# Patient Record
Sex: Female | Born: 1945 | Race: White | Hispanic: No | Marital: Married | Smoking: Never smoker
Health system: Southern US, Community
[De-identification: ages and names within clinical notes are randomized; demographics above are authoritative.]

## PROBLEM LIST (undated history)

## (undated) DIAGNOSIS — K219 Gastro-esophageal reflux disease without esophagitis: Secondary | ICD-10-CM

## (undated) DIAGNOSIS — D696 Thrombocytopenia, unspecified: Secondary | ICD-10-CM

## (undated) DIAGNOSIS — I2510 Atherosclerotic heart disease of native coronary artery without angina pectoris: Secondary | ICD-10-CM

## (undated) DIAGNOSIS — M8580 Other specified disorders of bone density and structure, unspecified site: Secondary | ICD-10-CM

## (undated) DIAGNOSIS — J189 Pneumonia, unspecified organism: Secondary | ICD-10-CM

## (undated) DIAGNOSIS — I509 Heart failure, unspecified: Secondary | ICD-10-CM

## (undated) DIAGNOSIS — I10 Essential (primary) hypertension: Secondary | ICD-10-CM

## (undated) DIAGNOSIS — M4800 Spinal stenosis, site unspecified: Secondary | ICD-10-CM

## (undated) DIAGNOSIS — E785 Hyperlipidemia, unspecified: Secondary | ICD-10-CM

## (undated) DIAGNOSIS — G4489 Other headache syndrome: Principal | ICD-10-CM

## (undated) DIAGNOSIS — R7303 Prediabetes: Secondary | ICD-10-CM

## (undated) DIAGNOSIS — M5136 Other intervertebral disc degeneration, lumbar region: Secondary | ICD-10-CM

## (undated) HISTORY — DX: Other specified disorders of bone density and structure, unspecified site: M85.80

## (undated) HISTORY — PX: CHOLECYSTECTOMY: SHX55

## (undated) HISTORY — DX: Atherosclerotic heart disease of native coronary artery without angina pectoris: I25.10

## (undated) HISTORY — PX: TUBAL LIGATION: SHX77

## (undated) HISTORY — DX: Thrombocytopenia, unspecified: D69.6

## (undated) HISTORY — PX: APPENDECTOMY: SHX54

## (undated) HISTORY — DX: Essential (primary) hypertension: I10

## (undated) HISTORY — DX: Other headache syndrome: G44.89

## (undated) HISTORY — DX: Heart failure, unspecified: I50.9

## (undated) HISTORY — DX: Hyperlipidemia, unspecified: E78.5

## (undated) HISTORY — DX: Other intervertebral disc degeneration, lumbar region: M51.36

---

## 1998-12-06 ENCOUNTER — Other Ambulatory Visit: Admission: RE | Admit: 1998-12-06 | Discharge: 1998-12-06 | Payer: Self-pay | Admitting: *Deleted

## 1999-11-25 ENCOUNTER — Other Ambulatory Visit: Admission: RE | Admit: 1999-11-25 | Discharge: 1999-11-25 | Payer: Self-pay | Admitting: *Deleted

## 2000-12-07 ENCOUNTER — Other Ambulatory Visit: Admission: RE | Admit: 2000-12-07 | Discharge: 2000-12-07 | Payer: Self-pay | Admitting: *Deleted

## 2002-12-11 HISTORY — PX: BACK SURGERY: SHX140

## 2002-12-11 HISTORY — PX: SHOULDER SURGERY: SHX246

## 2003-12-12 HISTORY — PX: COLONOSCOPY: SHX174

## 2004-10-01 ENCOUNTER — Ambulatory Visit: Payer: Self-pay | Admitting: Internal Medicine

## 2004-10-01 LAB — HM COLONOSCOPY: HM Colonoscopy: NORMAL

## 2004-12-08 ENCOUNTER — Ambulatory Visit: Payer: Self-pay | Admitting: Family Medicine

## 2004-12-09 ENCOUNTER — Ambulatory Visit: Payer: Self-pay | Admitting: Family Medicine

## 2005-02-21 ENCOUNTER — Ambulatory Visit: Payer: Self-pay | Admitting: Family Medicine

## 2005-05-11 ENCOUNTER — Ambulatory Visit: Payer: Self-pay | Admitting: Family Medicine

## 2005-05-11 ENCOUNTER — Other Ambulatory Visit: Admission: RE | Admit: 2005-05-11 | Discharge: 2005-05-11 | Payer: Self-pay | Admitting: Family Medicine

## 2005-05-11 LAB — CONVERTED CEMR LAB: Pap Smear: NORMAL

## 2005-08-15 ENCOUNTER — Ambulatory Visit: Payer: Self-pay | Admitting: Family Medicine

## 2005-09-01 ENCOUNTER — Ambulatory Visit: Payer: Self-pay | Admitting: Family Medicine

## 2006-01-29 ENCOUNTER — Encounter: Admission: RE | Admit: 2006-01-29 | Discharge: 2006-01-29 | Payer: Self-pay | Admitting: Orthopedic Surgery

## 2006-02-20 ENCOUNTER — Encounter: Admission: RE | Admit: 2006-02-20 | Discharge: 2006-02-20 | Payer: Self-pay | Admitting: Orthopedic Surgery

## 2006-11-10 DIAGNOSIS — M5136 Other intervertebral disc degeneration, lumbar region: Secondary | ICD-10-CM

## 2006-11-10 DIAGNOSIS — M51369 Other intervertebral disc degeneration, lumbar region without mention of lumbar back pain or lower extremity pain: Secondary | ICD-10-CM

## 2006-11-10 HISTORY — DX: Other intervertebral disc degeneration, lumbar region: M51.36

## 2006-11-10 HISTORY — DX: Other intervertebral disc degeneration, lumbar region without mention of lumbar back pain or lower extremity pain: M51.369

## 2006-11-13 ENCOUNTER — Ambulatory Visit: Payer: Self-pay | Admitting: Family Medicine

## 2006-11-21 ENCOUNTER — Ambulatory Visit: Payer: Self-pay | Admitting: Family Medicine

## 2006-12-06 ENCOUNTER — Ambulatory Visit: Payer: Self-pay | Admitting: Family Medicine

## 2007-07-16 ENCOUNTER — Ambulatory Visit: Payer: Self-pay | Admitting: Family Medicine

## 2007-07-16 DIAGNOSIS — H8110 Benign paroxysmal vertigo, unspecified ear: Secondary | ICD-10-CM | POA: Insufficient documentation

## 2007-07-16 DIAGNOSIS — E7800 Pure hypercholesterolemia, unspecified: Secondary | ICD-10-CM | POA: Insufficient documentation

## 2007-07-16 DIAGNOSIS — Z8679 Personal history of other diseases of the circulatory system: Secondary | ICD-10-CM | POA: Insufficient documentation

## 2007-07-16 DIAGNOSIS — M5137 Other intervertebral disc degeneration, lumbosacral region: Secondary | ICD-10-CM | POA: Insufficient documentation

## 2007-07-16 DIAGNOSIS — I10 Essential (primary) hypertension: Secondary | ICD-10-CM | POA: Insufficient documentation

## 2007-07-16 DIAGNOSIS — I509 Heart failure, unspecified: Secondary | ICD-10-CM

## 2007-07-16 DIAGNOSIS — I2510 Atherosclerotic heart disease of native coronary artery without angina pectoris: Secondary | ICD-10-CM | POA: Insufficient documentation

## 2008-02-11 ENCOUNTER — Telehealth: Payer: Self-pay | Admitting: Family Medicine

## 2008-04-02 ENCOUNTER — Ambulatory Visit: Payer: Self-pay | Admitting: Family Medicine

## 2008-04-08 ENCOUNTER — Ambulatory Visit: Payer: Self-pay | Admitting: Family Medicine

## 2008-04-08 ENCOUNTER — Telehealth: Payer: Self-pay | Admitting: Family Medicine

## 2008-04-10 LAB — CONVERTED CEMR LAB
ALT: 19 units/L (ref 0–35)
AST: 22 units/L (ref 0–37)
BUN: 18 mg/dL (ref 6–23)
CO2: 30 meq/L (ref 19–32)
Calcium: 9.5 mg/dL (ref 8.4–10.5)
Chloride: 108 meq/L (ref 96–112)
Cholesterol: 171 mg/dL (ref 0–200)
Creatinine, Ser: 1 mg/dL (ref 0.4–1.2)
GFR calc Af Amer: 72 mL/min
GFR calc non Af Amer: 60 mL/min
Glucose, Bld: 95 mg/dL (ref 70–99)
HDL: 40.5 mg/dL (ref >39.0)
LDL Cholesterol: 109 mg/dL — ABNORMAL HIGH (ref 0–99)
Potassium: 4.3 meq/L (ref 3.5–5.1)
Sodium: 143 meq/L (ref 135–145)
Total CHOL/HDL Ratio: 4.2
Triglycerides: 106 mg/dL (ref 0–149)
VLDL: 21 mg/dL (ref 0–40)

## 2008-07-09 ENCOUNTER — Ambulatory Visit: Payer: Self-pay | Admitting: Family Medicine

## 2008-07-09 ENCOUNTER — Other Ambulatory Visit: Admission: RE | Admit: 2008-07-09 | Discharge: 2008-07-09 | Payer: Self-pay | Admitting: Family Medicine

## 2008-07-09 DIAGNOSIS — N951 Menopausal and female climacteric states: Secondary | ICD-10-CM | POA: Insufficient documentation

## 2008-07-09 LAB — CONVERTED CEMR LAB: Pap Smear: NORMAL

## 2008-07-14 LAB — CONVERTED CEMR LAB
Basophils Absolute: 0 10*3/uL (ref 0.0–0.1)
Basophils Relative: 0.4 % (ref 0.0–3.0)
Eosinophils Absolute: 0.1 10*3/uL (ref 0.0–0.7)
Eosinophils Relative: 2.8 % (ref 0.0–5.0)
HCT: 41.5 % (ref 36.0–46.0)
Hemoglobin: 14.2 g/dL (ref 12.0–15.0)
Lymphocytes Relative: 24.6 % (ref 12.0–46.0)
MCHC: 34.3 g/dL (ref 30.0–36.0)
MCV: 90.6 fL (ref 78.0–100.0)
Monocytes Absolute: 0.2 10*3/uL (ref 0.1–1.0)
Monocytes Relative: 5 % (ref 3.0–12.0)
Neutro Abs: 2.8 10*3/uL (ref 1.4–7.7)
Neutrophils Relative %: 67.2 % (ref 43.0–77.0)
Platelets: 132 10*3/uL — ABNORMAL LOW (ref 150–400)
RBC: 4.58 M/uL (ref 3.87–5.11)
RDW: 12.2 % (ref 11.5–14.6)
TSH: 1.67 microintl units/mL (ref 0.35–5.50)
WBC: 4.1 10*3/uL — ABNORMAL LOW (ref 4.5–10.5)

## 2008-08-04 ENCOUNTER — Encounter: Payer: Self-pay | Admitting: Family Medicine

## 2008-08-04 LAB — HM MAMMOGRAPHY: HM Mammogram: NORMAL

## 2008-08-11 ENCOUNTER — Ambulatory Visit: Payer: Self-pay | Admitting: Family Medicine

## 2008-08-11 DIAGNOSIS — D696 Thrombocytopenia, unspecified: Secondary | ICD-10-CM | POA: Insufficient documentation

## 2008-08-13 LAB — CONVERTED CEMR LAB
Basophils Absolute: 0 10*3/uL (ref 0.0–0.1)
Basophils Relative: 0.7 % (ref 0.0–3.0)
Eosinophils Absolute: 0.2 10*3/uL (ref 0.0–0.7)
Eosinophils Relative: 4.6 % (ref 0.0–5.0)
HCT: 40 % (ref 36.0–46.0)
Hemoglobin: 14.4 g/dL (ref 12.0–15.0)
Lymphocytes Relative: 31.4 % (ref 12.0–46.0)
MCHC: 36 g/dL (ref 30.0–36.0)
MCV: 88.2 fL (ref 78.0–100.0)
Monocytes Absolute: 0.2 10*3/uL (ref 0.1–1.0)
Monocytes Relative: 6.7 % (ref 3.0–12.0)
Neutro Abs: 2.1 10*3/uL (ref 1.4–7.7)
Neutrophils Relative %: 56.6 % (ref 43.0–77.0)
Platelets: 135 10*3/uL — ABNORMAL LOW (ref 150–400)
RBC: 4.53 M/uL (ref 3.87–5.11)
RDW: 12.3 % (ref 11.5–14.6)
WBC: 3.7 10*3/uL — ABNORMAL LOW (ref 4.5–10.5)

## 2008-11-10 ENCOUNTER — Ambulatory Visit: Payer: Self-pay | Admitting: Family Medicine

## 2008-11-10 DIAGNOSIS — M8580 Other specified disorders of bone density and structure, unspecified site: Secondary | ICD-10-CM | POA: Insufficient documentation

## 2008-11-12 LAB — CONVERTED CEMR LAB: Vit D, 1,25-Dihydroxy: 24 — ABNORMAL LOW (ref 30–89)

## 2008-11-16 LAB — CONVERTED CEMR LAB
Basophils Absolute: 0 10*3/uL (ref 0.0–0.1)
Basophils Relative: 0.7 % (ref 0.0–3.0)
Eosinophils Absolute: 0.2 10*3/uL (ref 0.0–0.7)
Eosinophils Relative: 2.9 % (ref 0.0–5.0)
HCT: 40.5 % (ref 36.0–46.0)
Hemoglobin: 14.5 g/dL (ref 12.0–15.0)
Lymphocytes Relative: 20.9 % (ref 12.0–46.0)
MCHC: 35.8 g/dL (ref 30.0–36.0)
MCV: 89.3 fL (ref 78.0–100.0)
Monocytes Absolute: 0.3 10*3/uL (ref 0.1–1.0)
Monocytes Relative: 5.1 % (ref 3.0–12.0)
Neutro Abs: 3.8 10*3/uL (ref 1.4–7.7)
Neutrophils Relative %: 70.4 % (ref 43.0–77.0)
Platelets: 132 10*3/uL — ABNORMAL LOW (ref 150–400)
RBC: 4.54 M/uL (ref 3.87–5.11)
RDW: 11.6 % (ref 11.5–14.6)
WBC: 5.4 10*3/uL (ref 4.5–10.5)

## 2008-12-11 DIAGNOSIS — C801 Malignant (primary) neoplasm, unspecified: Secondary | ICD-10-CM

## 2008-12-11 HISTORY — DX: Malignant (primary) neoplasm, unspecified: C80.1

## 2009-04-14 ENCOUNTER — Ambulatory Visit: Payer: Self-pay | Admitting: Family Medicine

## 2009-05-18 ENCOUNTER — Ambulatory Visit: Payer: Self-pay | Admitting: Family Medicine

## 2009-05-19 LAB — CONVERTED CEMR LAB
Basophils Absolute: 0 10*3/uL (ref 0.0–0.1)
Basophils Relative: 0.8 % (ref 0.0–3.0)
Eosinophils Absolute: 0.1 10*3/uL (ref 0.0–0.7)
Eosinophils Relative: 3.3 % (ref 0.0–5.0)
HCT: 40.6 % (ref 36.0–46.0)
Hemoglobin: 14.4 g/dL (ref 12.0–15.0)
Lymphocytes Relative: 29.5 % (ref 12.0–46.0)
Lymphs Abs: 1 10*3/uL (ref 0.7–4.0)
MCHC: 35.5 g/dL (ref 30.0–36.0)
MCV: 87.3 fL (ref 78.0–100.0)
Monocytes Absolute: 0.2 10*3/uL (ref 0.1–1.0)
Monocytes Relative: 6.9 % (ref 3.0–12.0)
Neutro Abs: 2.1 10*3/uL (ref 1.4–7.7)
Neutrophils Relative %: 59.5 % (ref 43.0–77.0)
Platelets: 124 10*3/uL — ABNORMAL LOW (ref 150.0–400.0)
RBC: 4.66 M/uL (ref 3.87–5.11)
RDW: 12.3 % (ref 11.5–14.6)
WBC: 3.4 10*3/uL — ABNORMAL LOW (ref 4.5–10.5)

## 2009-05-24 LAB — CONVERTED CEMR LAB: Vit D, 25-Hydroxy: 33 ng/mL (ref 30–89)

## 2009-06-11 ENCOUNTER — Ambulatory Visit: Payer: Self-pay | Admitting: Family Medicine

## 2009-06-16 ENCOUNTER — Ambulatory Visit: Payer: Self-pay | Admitting: Oncology

## 2009-07-08 ENCOUNTER — Encounter: Payer: Self-pay | Admitting: Family Medicine

## 2009-07-08 LAB — CMP (CANCER CENTER ONLY)
ALT(SGPT): 21 U/L (ref 10–47)
AST: 26 U/L (ref 11–38)
Albumin: 3.9 g/dL (ref 3.3–5.5)
Alkaline Phosphatase: 69 U/L (ref 26–84)
BUN, Bld: 16 mg/dL (ref 7–22)
CO2: 31 mEq/L (ref 18–33)
Calcium: 9.5 mg/dL (ref 8.0–10.3)
Chloride: 104 mEq/L (ref 98–108)
Creat: 0.9 mg/dl (ref 0.6–1.2)
Glucose, Bld: 108 mg/dL (ref 73–118)
Potassium: 4.4 mEq/L (ref 3.3–4.7)
Sodium: 142 mEq/L (ref 128–145)
Total Bilirubin: 0.8 mg/dl (ref 0.20–1.60)
Total Protein: 7.1 g/dL (ref 6.4–8.1)

## 2009-07-08 LAB — CBC WITH DIFFERENTIAL (CANCER CENTER ONLY)
BASO#: 0 10*3/uL (ref 0.0–0.2)
BASO%: 0.8 % (ref 0.0–2.0)
EOS%: 4.1 % (ref 0.0–7.0)
Eosinophils Absolute: 0.2 10*3/uL (ref 0.0–0.5)
HCT: 40.5 % (ref 34.8–46.6)
HGB: 14.4 g/dL (ref 11.6–15.9)
LYMPH#: 0.9 10*3/uL (ref 0.9–3.3)
LYMPH%: 25.6 % (ref 14.0–48.0)
MCH: 30.4 pg (ref 26.0–34.0)
MCHC: 35.5 g/dL (ref 32.0–36.0)
MCV: 85 fL (ref 81–101)
MONO#: 0.2 10*3/uL (ref 0.1–0.9)
MONO%: 6.8 % (ref 0.0–13.0)
NEUT#: 2.3 10*3/uL (ref 1.5–6.5)
NEUT%: 62.7 % (ref 39.6–80.0)
Platelets: 134 10*3/uL — ABNORMAL LOW (ref 145–400)
RBC: 4.74 10*6/uL (ref 3.70–5.32)
RDW: 13 % (ref 10.5–14.6)
WBC: 3.6 10*3/uL — ABNORMAL LOW (ref 3.9–10.0)

## 2009-07-08 LAB — MORPHOLOGY - CHCC SATELLITE
PLT EST ~~LOC~~: DECREASED
RBC Comments: NORMAL

## 2009-08-10 ENCOUNTER — Telehealth: Payer: Self-pay | Admitting: Family Medicine

## 2009-08-11 ENCOUNTER — Telehealth: Payer: Self-pay | Admitting: Family Medicine

## 2009-11-01 ENCOUNTER — Ambulatory Visit: Payer: Self-pay | Admitting: Oncology

## 2009-11-08 ENCOUNTER — Encounter: Payer: Self-pay | Admitting: Family Medicine

## 2009-11-08 LAB — CBC WITH DIFFERENTIAL (CANCER CENTER ONLY)
BASO#: 0 10*3/uL (ref 0.0–0.2)
BASO%: 0.8 % (ref 0.0–2.0)
EOS%: 2.9 % (ref 0.0–7.0)
Eosinophils Absolute: 0.1 10*3/uL (ref 0.0–0.5)
HCT: 41.3 % (ref 34.8–46.6)
HGB: 13.9 g/dL (ref 11.6–15.9)
LYMPH#: 1.2 10*3/uL (ref 0.9–3.3)
LYMPH%: 25.9 % (ref 14.0–48.0)
MCH: 30.1 pg (ref 26.0–34.0)
MCHC: 33.6 g/dL (ref 32.0–36.0)
MCV: 90 fL (ref 81–101)
MONO#: 0.3 10*3/uL (ref 0.1–0.9)
MONO%: 5.7 % (ref 0.0–13.0)
NEUT#: 2.9 10*3/uL (ref 1.5–6.5)
NEUT%: 64.7 % (ref 39.6–80.0)
Platelets: 144 10*3/uL — ABNORMAL LOW (ref 145–400)
RBC: 4.61 10*6/uL (ref 3.70–5.32)
RDW: 12.5 % (ref 10.5–14.6)
WBC: 4.4 10*3/uL (ref 3.9–10.0)

## 2010-03-06 ENCOUNTER — Encounter: Admission: RE | Admit: 2010-03-06 | Discharge: 2010-03-06 | Payer: Self-pay | Admitting: Orthopaedic Surgery

## 2010-03-14 ENCOUNTER — Ambulatory Visit: Payer: Self-pay | Admitting: Family Medicine

## 2010-03-16 ENCOUNTER — Encounter: Payer: Self-pay | Admitting: Family Medicine

## 2010-03-16 LAB — CONVERTED CEMR LAB
ALT: 22 units/L (ref 0–35)
AST: 24 units/L (ref 0–37)
Albumin: 4.3 g/dL (ref 3.5–5.2)
Alkaline Phosphatase: 76 units/L (ref 39–117)
BUN: 16 mg/dL (ref 6–23)
Basophils Absolute: 0 10*3/uL (ref 0.0–0.1)
Basophils Relative: 0.3 % (ref 0.0–3.0)
Bilirubin, Direct: 0.1 mg/dL (ref 0.0–0.3)
CO2: 30 meq/L (ref 19–32)
Calcium: 9.4 mg/dL (ref 8.4–10.5)
Chloride: 104 meq/L (ref 96–112)
Cholesterol: 176 mg/dL (ref 0–200)
Creatinine, Ser: 1 mg/dL (ref 0.4–1.2)
Eosinophils Absolute: 0.1 10*3/uL (ref 0.0–0.7)
Eosinophils Relative: 1.1 % (ref 0.0–5.0)
GFR calc non Af Amer: 59.3 mL/min (ref >60)
Glucose, Bld: 111 mg/dL — ABNORMAL HIGH (ref 70–99)
HCT: 43.3 % (ref 36.0–46.0)
HDL: 55.4 mg/dL (ref >39.00)
Hemoglobin: 14.8 g/dL (ref 12.0–15.0)
LDL Cholesterol: 97 mg/dL (ref 0–99)
Lymphocytes Relative: 15.5 % (ref 12.0–46.0)
Lymphs Abs: 0.9 10*3/uL (ref 0.7–4.0)
MCHC: 34.3 g/dL (ref 30.0–36.0)
MCV: 91.1 fL (ref 78.0–100.0)
Monocytes Absolute: 0.3 10*3/uL (ref 0.1–1.0)
Monocytes Relative: 4.4 % (ref 3.0–12.0)
Neutro Abs: 4.7 10*3/uL (ref 1.4–7.7)
Neutrophils Relative %: 78.7 % — ABNORMAL HIGH (ref 43.0–77.0)
Platelets: 130 10*3/uL — ABNORMAL LOW (ref 150.0–400.0)
Potassium: 4.4 meq/L (ref 3.5–5.1)
RBC: 4.75 M/uL (ref 3.87–5.11)
RDW: 13.2 % (ref 11.5–14.6)
Sodium: 143 meq/L (ref 135–145)
TSH: 1.59 microintl units/mL (ref 0.35–5.50)
Total Bilirubin: 0.4 mg/dL (ref 0.3–1.2)
Total CHOL/HDL Ratio: 3
Total Protein: 7 g/dL (ref 6.0–8.3)
Triglycerides: 116 mg/dL (ref 0.0–149.0)
VLDL: 23.2 mg/dL (ref 0.0–40.0)
Vit D, 25-Hydroxy: 56 ng/mL (ref 30–89)
WBC: 6 10*3/uL (ref 4.5–10.5)

## 2010-03-30 ENCOUNTER — Encounter: Payer: Self-pay | Admitting: Family Medicine

## 2010-03-31 DIAGNOSIS — R928 Other abnormal and inconclusive findings on diagnostic imaging of breast: Secondary | ICD-10-CM | POA: Insufficient documentation

## 2010-04-06 ENCOUNTER — Encounter: Payer: Self-pay | Admitting: Family Medicine

## 2010-04-12 ENCOUNTER — Encounter: Admission: RE | Admit: 2010-04-12 | Discharge: 2010-04-12 | Payer: Self-pay | Admitting: Family Medicine

## 2010-11-07 ENCOUNTER — Ambulatory Visit: Payer: Self-pay | Admitting: Oncology

## 2010-11-09 ENCOUNTER — Encounter: Payer: Self-pay | Admitting: Family Medicine

## 2010-11-09 LAB — CBC WITH DIFFERENTIAL/PLATELET
BASO%: 0.5 % (ref 0.0–2.0)
Basophils Absolute: 0 10*3/uL (ref 0.0–0.1)
EOS%: 2.8 % (ref 0.0–7.0)
Eosinophils Absolute: 0.1 10*3/uL (ref 0.0–0.5)
HCT: 42.8 % (ref 34.8–46.6)
HGB: 15 g/dL (ref 11.6–15.9)
LYMPH%: 29.1 % (ref 14.0–49.7)
MCH: 31 pg (ref 25.1–34.0)
MCHC: 35.1 g/dL (ref 31.5–36.0)
MCV: 88.3 fL (ref 79.5–101.0)
MONO#: 0.3 10*3/uL (ref 0.1–0.9)
MONO%: 6.9 % (ref 0.0–14.0)
NEUT#: 2.7 10*3/uL (ref 1.5–6.5)
NEUT%: 60.7 % (ref 38.4–76.8)
Platelets: 134 10*3/uL — ABNORMAL LOW (ref 145–400)
RBC: 4.84 10*6/uL (ref 3.70–5.45)
RDW: 12.5 % (ref 11.2–14.5)
WBC: 4.4 10*3/uL (ref 3.9–10.3)
lymph#: 1.3 10*3/uL (ref 0.9–3.3)

## 2011-01-10 NOTE — Assessment & Plan Note (Signed)
Summary: WALK IN   Vital Signs:  Patient Profile:   65 Years Old Female Temp:     97.8 degrees F oral Pulse rate:   72 / minute Pulse rhythm:   regular BP sitting:   110 / 68  (right arm) Cuff size:   large  Vitals Entered By: Lowella Petties (July 16, 2007 9:01 AM)               Chief Complaint:  Dizzy since early a.m..  History of Present Illness: got up this am at 4:40 and was dizzy  room started spinning at 5 and is still doing it has not eaten little slt ha but no fever no numb, weakness, or trouble with speech        Review of Systems      See HPI  General      Denies chills, fatigue, and fever.  Eyes      Denies blurring.  CV      Denies chest pain or discomfort.  Resp      Denies shortness of breath.  GI      Complains of nausea.      Denies vomiting.  Neuro      Complains of poor balance and sensation of room spinning.      Denies inability to speak, memory loss, numbness, visual disturbances, and weakness.  Psych      mood is ok   Physical Exam  General:     overwt, acutely dizzy in wheelchair Head:     Normocephalic and atraumatic without obvious abnormalities. No apparent alopecia or balding. Eyes:     vision grossly intact, pupils equal, pupils round, and pupils reactive to light.  3-4 beats of horiz nystagmus bilat Ears:     R ear normal and L ear normal.   Nose:     no nasal discharge.   Mouth:     pharynx pink and moist.   Neck:     No deformities, masses, or tenderness noted.no thyromegaly, no JVD, and no carotid bruits.   Lungs:     Normal respiratory effort, chest expands symmetrically. Lungs are clear to auscultation, no crackles or wheezes. Heart:     Normal rate and regular rhythm. S1 and S2 normal without gallop, murmur, click, rub or other extra sounds. Msk:     No deformity or scoliosis noted of thoracic or lumbar spine.   Neurologic:     cranial nerves II-XII intact, strength normal in all extremities,  sensation intact to light touch, DTRs symmetrical and normal, and toes down bilaterally on Babinski.   pt swayed all directions on Rhomberg test but could stand unassisted Skin:     turgor normal, color normal, and no rashes.  no pallor Cervical Nodes:     No lymphadenopathy noted Psych:     pt is quiet today, nl affect, nl speech    Impression & Recommendations:  Problem # 1:  BENIGN POSITIONAL VERTIGO (ICD-386.11) with benign neuro exam stressed to call or seek care if worse or any neurological signs or symptoms work note to return 8/7 if feeling better Her updated medication list for this problem includes:    Meclizine Hcl 25 Mg Tabs (Meclizine hcl) .Marland Kitchen... 1 by mouth three times a day as needed dizziness   Complete Medication List: 1)  Adult Aspirin Low Strength 81 Mg Tbdp (Aspirin) .... One by mouth qd 2)  Caduet 5-20 Mg Tabs (Amlodipine-atorvastatin) .... One by mouth qd 3)  Micardis 80 Mg Tabs (Telmisartan) .... One half by mouth qd 4)  Aleve 220 Mg Tabs (Naproxen sodium) .... Two by mouth qd 5)  Meclizine Hcl 25 Mg Tabs (Meclizine hcl) .Marland Kitchen.. 1 by mouth three times a day as needed dizziness   Patient Instructions: 1)  try to keep from moving head quickly or suddenly 2)  meclizine will sedate you, use caution- and stop it if you get blurry vision 3)  if any numbness, weakness or other change go to the ER 4)  keep fluid intake up 5)  call if not improved in several days    Prescriptions: MECLIZINE HCL 25 MG  TABS (MECLIZINE HCL) 1 by mouth three times a day as needed dizziness  #30 x 1   Entered and Authorized by:   Judith Part MD   Signed by:   Judith Part MD on 07/16/2007   Method used:   Print then Give to Patient   RxID:   [id number]       Prior Medications: ADULT ASPIRIN LOW STRENGTH 81 MG  TBDP (ASPIRIN) one by mouth qd CADUET 5-20 MG  TABS (AMLODIPINE-ATORVASTATIN) one by mouth qd MICARDIS 80 MG  TABS (TELMISARTAN) one half by mouth qd ALEVE  220 MG  TABS (NAPROXEN SODIUM) two by mouth qd

## 2011-01-10 NOTE — Assessment & Plan Note (Signed)
Summary: acute/out of meds/cmt   Vital Signs:  Patient Profile:   65 Years Old Female Weight:      204 pounds Temp:     98.0 degrees F oral Pulse rate:   75 / minute BP sitting:   137 / 105  (right arm) Cuff size:   large  Vitals Entered By: Cooper Render (April 02, 2008 11:37 AM)                 Chief Complaint:  med refill and caduet.  History of Present Illness: Her due to being out of Caduet--has not been seen in>49yr for HBP.  No labs in 1 yr.  Has refills on MicardisHCT    Current Allergies (reviewed today): No known allergies      Review of Systems      See HPI   Physical Exam  General:     alert, well-developed, well-nourished, and well-hydrated.  NAD Neck:     normal carotid upstroke and no carotid bruits.   Lungs:     normal respiratory effort, no intercostal retractions, no accessory muscle use, and normal breath sounds.   Heart:     repeat BP---148/88normal rate, regular rhythm, and no murmur.   Extremities:     no pretibial edema bilat Psych:     normally interactive and good eye contact.      Impression & Recommendations:  Problem # 1:  HYPERTENSION (ICD-401.9) Assessment: Unchanged stable on current meds gave her a week of Caduet--needs fassting labs done during that time will refill Caduet if labs WNL, or leave to Dr Janit Pagan see Dr Milinda Antis in follow up as can be scheduled. The following medications were removed from the medication list:    Tekturna Hct 150-25 Mg Tabs (Aliskiren-hydrochlorothiazide)  Her updated medication list for this problem includes:    Caduet 5-20 Mg Tabs (Amlodipine-atorvastatin) ..... One by mouth qd    Micardis Hct 80-12.5 Mg Tabs (Telmisartan-hctz) .Marland Kitchen... 1/2 by mouth qd  BP today: 137/105 Prior BP: 110/68 (07/16/2007)   Complete Medication List: 1)  Adult Aspirin Low Strength 81 Mg Tbdp (Aspirin) .... One by mouth qd 2)  Caduet 5-20 Mg Tabs (Amlodipine-atorvastatin) .... One by mouth qd 3)  Aleve 220 Mg  Tabs (Naproxen sodium) .... Two by mouth qd 4)  Micardis Hct 80-12.5 Mg Tabs (Telmisartan-hctz) .... 1/2 by mouth qd   Patient Instructions: 1)  schedule in for fasting lipids, SGOT/SGPT--272.0 2)                                       B'met--401.1 3)  needs to be done within 1 wk for Dr Milinda Antis 4)  schedule in to seeDr Milinda Antis for CPX in mrxt few months    ] Prior Medications (reviewed today): ADULT ASPIRIN LOW STRENGTH 81 MG  TBDP (ASPIRIN) one by mouth qd CADUET 5-20 MG  TABS (AMLODIPINE-ATORVASTATIN) one by mouth qd ALEVE 220 MG  TABS (NAPROXEN SODIUM) two by mouth qd MICARDIS HCT 80-12.5 MG  TABS (TELMISARTAN-HCTZ) 1/2 by mouth qd Current Allergies (reviewed today): No known allergies

## 2011-01-10 NOTE — Assessment & Plan Note (Signed)
Summary: CHECK MEDICINE & DISCUSS LABS/BIR   Vital Signs:  Patient profile:   65 year old female Height:      64 inches Weight:      207 pounds BMI:     35.66 Temp:     97.9 degrees F oral Pulse rate:   76 / minute Pulse rhythm:   regular BP sitting:   120 / 70  (left arm) Cuff size:   large  Vitals Entered By: Providence Crosby (June 11, 2009 3:56 PM) CC: discuss medications and labs    History of Present Illness: here for f/u  at last visit in may was started on omeprazole for gastritis- also adv to stop fosamax and aleve was better in a couple of days  no problems or side effects with omeprazole  is able to eat nl   re check cbc with diff- platelet count stable  wbc slt  low not bleeding or bruising at all   tylenol helps arthritis pain - no problems (will not return to aleve)    vit D 33  did start 2000 international units   Allergies: 1)  ! * Aleve  Past History:  Past Medical History: Last updated: 11/10/2008 Congestive heart failure Coronary artery disease Hypertension mild thrombocytopenia  osteopenia   Past Surgical History: Last updated: 07/16/2007 Cholecystectomy Tubal ligation Shoulder surgery- fall  Colonoscopy (09/2004) Carotid doppler- 39 % stenosis (03/2004) Echo- mild LVH, EF 60% Back and shoulder surgery, fall at work (2004) L5- S1 bulging disk (11/2006)  Family History: Last updated: 07/09/2008 Father: CAD Mother: pacemaker Siblings: sister- lung cancer MGM breast ca sister with DM  sister OP   Social History: Last updated: 07/09/2008 Marital Status: Married Children: 4 daughters Occupation: -- Chief Financial Officer  non smoker   Risk Factors: Smoking Status: never (07/16/2007)  Review of Systems General:  Denies fever, loss of appetite, and malaise. Eyes:  Denies blurring and itching. ENT:  Denies earache, sinus pressure, and sore throat. CV:  Denies fainting, fatigue, lightheadness, near fainting, and shortness of breath with  exertion. Resp:  Denies cough and wheezing. GI:  Denies abdominal pain, bloody stools, change in bowel habits, gas, hemorrhoids, indigestion, nausea, and vomiting. GU:  Denies discharge and dysuria. Derm:  Denies lesion(s), poor wound healing, and rash. Neuro:  Denies numbness and tingling. Psych:  mood is ok. Endo:  Denies excessive thirst and excessive urination. Heme:  Denies abnormal bruising and bleeding.  Physical Exam  General:  overweight but generally well appearing  Head:  normocephalic, atraumatic, and no abnormalities observed.   Eyes:  vision grossly intact, pupils equal, pupils round, and pupils reactive to light.   Ears:  R ear normal and L ear normal.   Nose:  no nasal discharge.   Mouth:  pharynx pink and moist.   Neck:  supple with full rom and no masses or thyromegally, no JVD or carotid bruit  Chest Wall:  No deformities, masses, or tenderness noted. Lungs:  Normal respiratory effort, chest expands symmetrically. Lungs are clear to auscultation, no crackles or wheezes. Heart:  Normal rate and regular rhythm. S1 and S2 normal without gallop, murmur, click, rub or other extra sounds. Abdomen:  Bowel sounds positive,abdomen soft and non-tender without masses, organomegaly or hernias noted. Msk:  No deformity or scoliosis noted of thoracic or lumbar spine.   Extremities:  No clubbing, cyanosis, edema, or deformity noted with normal full range of motion of all joints.   Neurologic:  sensation intact to light touch,  gait normal, and DTRs symmetrical and normal.   Skin:  Intact without suspicious lesions or rashes Cervical Nodes:  No lymphadenopathy noted Inguinal Nodes:  No significant adenopathy Psych:  normal affect, talkative and pleasant    Impression & Recommendations:  Problem # 1:  GASTRITIS (ICD-535.50) Assessment Improved much improved off fosamax and aleve  also resolution of some heartburn  disc diet/wt loss continue ppi once daily for 1 mo then every  other day- and update Her updated medication list for this problem includes:    Omeprazole 20 Mg Tbec (Omeprazole) .Marland Kitchen... 1 by mouth once daily in am 30 minutes before breakfast  Problem # 2:  THROMBOCYTOPENIA (ICD-287.5)  platelet count continues to be mildly low without symptoms  slt low wbc as well  ref to heme   Orders: Hematology Referral (Hematology)  Problem # 3:  OSTEOPENIA (ICD-733.90) Assessment: Unchanged hold fosamax due to gi upset disc vit D suppl- 2000 international units daily add miacalcin ns  The following medications were removed from the medication list:    Fosamax 70 Mg Tabs (Alendronate sodium) .Marland Kitchen... 1 by mouth once weekly as directed Her updated medication list for this problem includes:    Caltrate 600+d 600-400 Mg-unit Tabs (Calcium carbonate-vitamin d) ..... One by mouth two times a day    Miacalcin 200 Unit/ml Soln (Calcitonin (salmon)) .Marland Kitchen... 1 spray in one nostril daily - alternate nostils  Complete Medication List: 1)  Adult Aspirin Low Strength 81 Mg Tbdp (Aspirin) .... One by mouth daily 2)  Caduet 5-20 Mg Tabs (Amlodipine-atorvastatin) .... One by mouth once daily 3)  Micardis Hct 80-12.5 Mg Tabs (Telmisartan-hctz) .... 1/2 by mouth once daily 4)  Caltrate 600+d 600-400 Mg-unit Tabs (Calcium carbonate-vitamin d) .... One by mouth two times a day 5)  Omeprazole 20 Mg Tbec (Omeprazole) .Marland Kitchen.. 1 by mouth once daily in am 30 minutes before breakfast 6)  Miacalcin 200 Unit/ml Soln (Calcitonin (salmon)) .Marland Kitchen.. 1 spray in one nostril daily - alternate nostils  Patient Instructions: 1)  take the omeprazole 20 mg once daily for one month 2)  then cut to one every other day for 2 weeks 3)  then call and update me - will decide to either stop it or continue  4)  stay away from spicy foods  5)  stay away from aleve/ advil or other medicines of that nature  Prescriptions: MIACALCIN 200 UNIT/ML SOLN (CALCITONIN (SALMON)) 1 spray in one nostril daily - alternate  nostils  #1 month x 11   Entered and Authorized by:   Judith Part MD   Signed by:   Judith Part MD on 06/11/2009   Method used:   Print then Give to Patient   RxID:   [id number]

## 2011-01-10 NOTE — Progress Notes (Signed)
Summary: ? Micardis  Phone Note From Pharmacy Call back at [phone]   Caller: [facility] Call For: Dr. Milinda Antis  Summary of Call: He was getting ready to fill her Micardis and directions show 1/2 daily and these are in a blister pack and they should not be cut and left out because they are moisture sensative and will absorb the moisture if left out for a day.  Please call back because this needs to be changes. Maybe she could be changed to something else or switched to one every other day. Call to discuss with him. Initial call taken by: Sydell Axon,  February 11, 2008 5:04 PM  Follow-up for Phone Call        will change from 1/2 of 80 to a whole 40 mg daily px written on EMR for call in  Follow-up by: Judith Part MD,  February 11, 2008 5:08 PM  Additional Follow-up for Phone Call Additional follow up Details #1::        this is supposed to be micardis hct and it doesn't come in a 40/12.5, please adv  I changed my mind- for now keep splitting micardis hctz 80- I do not think it will make that big a difference cutting it - keep doing the same way  Additional Follow-up by: Lowella Petties,  February 11, 2008 5:13 PM    Additional Follow-up for Phone Call Additional follow up Details #2::    advised pharmacist Follow-up by: Lowella Petties,  February 12, 2008 12:21 PM  New/Updated Medications: TEKTURNA HCT 150-25 MG  TABS (ALISKIREN-HYDROCHLOROTHIAZIDE)  MICARDIS HCT 80-12.5 MG  TABS (TELMISARTAN-HCTZ) 1/2 by mouth qd

## 2011-01-10 NOTE — Letter (Signed)
Summary: [facility]  [facility]   Imported By: Lanelle Bal 11/24/2009 12:21:46  _____________________________________________________________________  External Attachment:    Type:   Image     Comment:   External Document

## 2011-01-10 NOTE — Progress Notes (Signed)
Summary: regarding preferred meds  Phone Note Call from Patient Call back at Work Phone [phone]   Caller: Patient Call For: Judith Part MD Summary of Call: Pt says her insurance company prefers protonix or prevacid, please send to [facility] [employer]. (See phone note from yesterday). Initial call taken by: Lowella Petties CMA,  August 11, 2009 1:29 PM  Follow-up for Phone Call        will try protonix px written on EMR for call in  please update me if heartburn is not improved with this next week Follow-up by: Judith Part MD,  August 11, 2009 1:44 PM  Additional Follow-up for Phone Call Additional follow up Details #1::        Advised patient.  ......................................................Marland KitchenNatasha Chavers CMA (AAMA) August 11, 2009 2:51 PM     New/Updated Medications: PROTONIX 40 MG TBEC (PANTOPRAZOLE SODIUM) 1 by mouth once daily in am Prescriptions: PROTONIX 40 MG TBEC (PANTOPRAZOLE SODIUM) 1 by mouth once daily in am  #30 x 5   Entered by:   Liane Comber CMA (AAMA)   Authorized by:   Judith Part MD   Signed by:   Liane Comber CMA Duncan Dull) on 08/11/2009   Method used:   Electronically to        [facility]* (retail)       [address]       [place], [place]  [address]       Ph: [phone]       Fax: [phone]   RxID:   [id number]

## 2011-01-10 NOTE — Letter (Signed)
Summary: [facility]   Imported By: Lanelle Bal 03/22/2010 13:19:13  _____________________________________________________________________  External Attachment:    Type:   Image     Comment:   External Document

## 2011-01-10 NOTE — Progress Notes (Signed)
Summary: regarding omeprazole  Phone Note Call from Patient Call back at Work Phone [phone]   Caller: Patient Call For: Judith Part MD Summary of Call: Pt is calling to report how she has done with omeprazole.  She says the abd pain is gone but she has terrible indegestion, having to take 3 or 4 rolaids a day.  Please advise.  She will need refill sent to [facility] if you want her to continue with it. Initial call taken by: Lowella Petties CMA,  August 10, 2009 9:18 AM  Follow-up for Phone Call        thanks for the update -- I will want to try another PPI to see if we can improve that  please have her call her insurance- and see what proton pump inhibitor is covered /preferred  choices include nexium, protonix, aciphex, prevacid , zegerid let me know and I will px Follow-up by: Judith Part MD,  August 10, 2009 10:21 AM  Additional Follow-up for Phone Call Additional follow up Details #1::        Advised pt, she will let us know. Additional Follow-up by: Lowella Petties CMA,  August 10, 2009 3:16 PM

## 2011-01-10 NOTE — Assessment & Plan Note (Signed)
Summary: CPX/DLO   Vital Signs:  Patient profile:   65 year old female Height:      64.25 inches Weight:      205.50 pounds BMI:     35.13 Temp:     98 degrees F oral Pulse rate:   76 / minute Pulse rhythm:   regular BP sitting:   128 / 76  (left arm) Cuff size:   large  Vitals Entered By: Lewanda Rife LPN (March 14, 9146 10:27 AM) CC: complete physical with pap and breast exam LMP 16yrs ago   History of Present Illness: here for health mt and also to rev chronic med problems   other than her back - is doing ok   bp 128/76- very good control  hx of coronary artery disease nl carotid dopplers in o5 recent echo- no problems at all   osteopenia 8/09- due f/u this summer-- wants to disc this at next PE -- and wait a while  ca and vit D -- is faithful with this  no exercise at all -- due to back pain   lipids due Last Lipid ProfileCholesterol: 171 (04/08/2008 9:48:00 AM)HDL:  40.5 (04/08/2008 9:48:00 AM)LDL:  109 (04/08/2008 9:48:00 AM)Triglycerides:  Last Liver profileSGOT:  22 (04/08/2008 9:48:00 AM)SPGT:  19 (04/08/2008 9:48:00 AM)T. Bili:  Alk Phos:    has been in fairl control diet has been very good -and is fasting for labs   colonosc nl 05- 10 y f/u recommended  pap 09  nl -- is 2 years- will wait one more year  no hx of abn paps     mam 8/09 nl -- is overdue and wants to schedule  self exam- no lumps or changes   TD04   has something going on with her back - for 2 months  pain kept getting worse- saw Dr Rayburn Ma on 3/22-- ? pinched nerve MRI showed protruding disks -- and has f/u on wed   Allergies: 1)  ! * Aleve  Past History:  Past Surgical History: Last updated: 07/16/2007 Cholecystectomy Tubal ligation Shoulder surgery- fall  Colonoscopy (09/2004) Carotid doppler- 39 % stenosis (03/2004) Echo- mild LVH, EF 60% Back and shoulder surgery, fall at work (2004) L5- S1 bulging disk (11/2006)  Family History: Last updated: 07/09/2008 Father:  CAD Mother: pacemaker Siblings: sister- lung cancer MGM breast ca sister with DM  sister OP   Social History: Last updated: 07/09/2008 Marital Status: Married Children: 4 daughters Occupation: -- Chief Financial Officer  non smoker   Risk Factors: Smoking Status: never (07/16/2007)  Past Medical History: Congestive heart failure Coronary artery disease Hypertension mild thrombocytopenia  osteopenia  hyperlipidemia deg disc dz in low back    cardiol  Review of Systems General:  Complains of fatigue; denies fever, loss of appetite, and malaise. Eyes:  Denies blurring and eye irritation. ENT:  Denies sinus pressure and sore throat. CV:  Denies chest pain or discomfort, palpitations, shortness of breath with exertion, and swelling of feet. Resp:  Denies cough and wheezing. GI:  Denies abdominal pain, bloody stools, change in bowel habits, indigestion, and loss of appetite. GU:  Denies abnormal vaginal bleeding, discharge, dysuria, and urinary frequency. MS:  Complains of low back pain, mid back pain, and stiffness; denies cramps and muscle weakness; pain does radiate down her back. Derm:  Denies itching, lesion(s), poor wound healing, and rash. Neuro:  Denies numbness and tingling. Psych:  mood is fairly stable . Endo:  Denies cold intolerance, excessive thirst, excessive urination, and  heat intolerance. Heme:  Denies abnormal bruising and bleeding.  Physical Exam  General:  overweight but generally well appearing  Head:  normocephalic, atraumatic, and no abnormalities observed.   Eyes:  vision grossly intact, pupils equal, pupils round, and pupils reactive to light.  no conjunctival pallor, injection or icterus  Ears:  R ear normal and L ear normal.   Nose:  no nasal discharge.   Mouth:  pharynx pink and moist.   Neck:  supple with full rom and no masses or thyromegally, no JVD or carotid bruit  Chest Wall:  No deformities, masses, or tenderness noted. Breasts:  No mass,  nodules, thickening, tenderness, bulging, retraction, inflamation, nipple discharge or skin changes noted.   Lungs:  Normal respiratory effort, chest expands symmetrically. Lungs are clear to auscultation, no crackles or wheezes. Heart:  Normal rate and regular rhythm. S1 and S2 normal without gallop, murmur, click, rub or other extra sounds. Abdomen:  Bowel sounds positive,abdomen soft and non-tender without masses, organomegaly or hernias noted. no renal bruits  Msk:  No deformity or scoliosis noted of thoracic or lumbar spine.  poor rom LS- pt is uncomfortable with slow but steady gait  Pulses:  R and L carotid,radial,femoral,dorsalis pedis and posterior tibial pulses are full and equal bilaterally Extremities:  No clubbing, cyanosis, edema, or deformity noted with normal full range of motion of all joints.   Neurologic:  sensation intact to light touch and DTRs symmetrical and normal.   Skin:  Intact without suspicious lesions or rashes lentigos diffusely  Cervical Nodes:  No lymphadenopathy noted Axillary Nodes:  No palpable lymphadenopathy Inguinal Nodes:  No significant adenopathy Psych:  normal affect, talkative and pleasant    Impression & Recommendations:  Problem # 1:  HEALTH MAINTENANCE EXAM (ICD-V70.0) Assessment Comment Only reviewed health habits including diet, exercise and skin cancer prevention reviewed health maintenance list and family history  labs today disc imp of wt loss when able for better health Orders: Venipuncture (57846) TLB-Lipid Panel (80061-LIPID) TLB-BMP (Basic Metabolic Panel-BMET) (80048-METABOL) TLB-CBC Platelet - w/Differential (85025-CBCD) TLB-Hepatic/Liver Function Pnl (80076-HEPATIC) TLB-TSH (Thyroid Stimulating Hormone) (84443-TSH)  Problem # 2:  THROMBOCYTOPENIA (ICD-287.5) Assessment: Improved followed by heme and normalized now-- contnues to f/u yearly  no symptoms   Problem # 3:  OTHER SCREENING MAMMOGRAM (ICD-V76.12) Assessment:  Unchanged annual mammogram scheduled adv pt to continue regular self breast exams non remarkable breast exam today  Orders: Radiology Referral (Radiology)  Problem # 4:  HYPERTENSION (ICD-401.9) Assessment: Unchanged bp remains in good control with current meds  no changes  lab today urged to stay as active as she can Her updated medication list for this problem includes:    Caduet 5-20 Mg Tabs (Amlodipine-atorvastatin) ..... One by mouth once daily    Micardis Hct 80-12.5 Mg Tabs (Telmisartan-hctz) .Marland Kitchen... 1/2 by mouth once daily  Orders: Venipuncture (96295) TLB-Lipid Panel (80061-LIPID) TLB-BMP (Basic Metabolic Panel-BMET) (80048-METABOL) TLB-CBC Platelet - w/Differential (85025-CBCD) TLB-Hepatic/Liver Function Pnl (80076-HEPATIC) TLB-TSH (Thyroid Stimulating Hormone) (84443-TSH)  BP today: 128/76 Prior BP: 120/70 (06/11/2009)  Labs Reviewed: K+: 4.3 (04/08/2008) Creat: : 1.0 (04/08/2008)   Chol: 171 (04/08/2008)   HDL: 40.5 (04/08/2008)   LDL: 109 (04/08/2008)   TG: 106 (04/08/2008)  Problem # 5:  Hx of DEGENERATIVE DISC DISEASE, LUMBAR SPINE (ICD-722.52) Assessment: Comment Only for f/u wed- with ortho- hopes to get some relief hydrocodone as needed   Problem # 6:  Hx of HYPERCHOLESTEROLEMIA (ICD-272.0) Assessment: Unchanged  fair control with caduet rev low sat  fat diet- per pt is compliant with that  lab today and adv  Her updated medication list for this problem includes:    Caduet 5-20 Mg Tabs (Amlodipine-atorvastatin) ..... One by mouth once daily  Orders: Venipuncture (82956) TLB-Lipid Panel (80061-LIPID) TLB-BMP (Basic Metabolic Panel-BMET) (80048-METABOL) TLB-CBC Platelet - w/Differential (85025-CBCD) TLB-Hepatic/Liver Function Pnl (80076-HEPATIC) TLB-TSH (Thyroid Stimulating Hormone) (84443-TSH)  Labs Reviewed: SGOT: 22 (04/08/2008)   SGPT: 19 (04/08/2008)   HDL:40.5 (04/08/2008)  LDL:109 (04/08/2008)  Chol:171 (04/08/2008)  Trig:106  (04/08/2008)  Complete Medication List: 1)  Adult Aspirin Low Strength 81 Mg Tbdp (Aspirin) .... One by mouth daily 2)  Caduet 5-20 Mg Tabs (Amlodipine-atorvastatin) .... One by mouth once daily 3)  Micardis Hct 80-12.5 Mg Tabs (Telmisartan-hctz) .... 1/2 by mouth once daily 4)  Caltrate 600+d 600-400 Mg-unit Tabs (Calcium carbonate-vitamin d) .... One by mouth two times a day 5)  Miacalcin 200 Unit/ml Soln (Calcitonin (salmon)) .Marland Kitchen.. 1 spray in one nostril daily - alternate nostils 6)  Protonix 40 Mg Tbec (Pantoprazole sodium) .Marland Kitchen.. 1 by mouth once daily in am 7)  Hydrocodone-acetaminophen 5-325 Mg Tabs (Hydrocodone-acetaminophen) .... Take one tablet every 8 hours as needed for pain  Other Orders: T-Vitamin D (25-Hydroxy) (21308-65784) Specimen Handling (69629)  Patient Instructions: 1)  labs today  2)  we will schedule annual mammogram at check out  3)  follow up with orthopedics as planned  4)  try to stick with healthy diet and exercise when you able to  Prescriptions: PROTONIX 40 MG TBEC (PANTOPRAZOLE SODIUM) 1 by mouth once daily in am  #30 x 11   Entered and Authorized by:   Judith Part MD   Signed by:   Judith Part MD on 03/14/2010   Method used:   Electronically to        [facility]* (retail)       [address]       [place], [place]  [address]       Ph: [phone]       Fax: [phone]   RxID:   [id number] MIACALCIN 200 UNIT/ML SOLN (CALCITONIN (SALMON)) 1 spray in one nostril daily - alternate nostils  #1 month x 11   Entered and Authorized by:   Judith Part MD   Signed by:   Judith Part MD on 03/14/2010   Method used:   Electronically to        [facility]* (retail)       [address]       [place], [place]  [address]       Ph: [phone]       Fax: [phone]   RxID:   [id number] MICARDIS HCT 80-12.5 MG  TABS (TELMISARTAN-HCTZ) 1/2 by mouth once daily  #15 x 11   Entered and  Authorized by:   Judith Part MD   Signed by:   Judith Part MD on 03/14/2010   Method used:   Electronically to        [facility]* (retail)       [address]       [place], [place]  [address]       Ph: [phone]       Fax: [phone]   RxID:   [id number] CADUET 5-20 MG  TABS (AMLODIPINE-ATORVASTATIN) one by mouth once daily  #30 x 11  Entered and Authorized by:   Judith Part MD   Signed by:   Judith Part MD on 03/14/2010   Method used:   Electronically to        [facility]* (retail)       [address]       [place], [place]  [address]       Ph: [phone]       Fax: [phone]   RxID:   [id number]   Current Allergies (reviewed today): ! * ALEVE

## 2011-01-10 NOTE — Assessment & Plan Note (Signed)
Summary: LABS AND BONE DENSITY RESULTS'DLO   Vital Signs:  Patient Profile:   65 Years Old Female Weight:      201 pounds Temp:     97.5 degrees F oral Pulse rate:   64 / minute Pulse rhythm:   regular BP sitting:   130 / 80  (left arm) Cuff size:   large  Vitals Entered By: Liane Comber (November 10, 2008 10:15 AM)                 Chief Complaint:  f/u labs and bone density.  History of Present Illness: platelet ct has been in 130s last 2 checks no trouble with blood ct in past  no easy bleeding or signs of infection - and no family hx of blood disorder  is on low dose aspirin  overall feels fine   dexa - first one  T score hip -1.13 T score for LS was -1.79 osteopenia  sister has OP   is taking her calcium and vit D not enough exercise- likes to walk, and hs very active lifestyle  has never broken bone          Current Allergies (reviewed today): No known allergies   Past Medical History:    Congestive heart failure    Coronary artery disease    Hypertension    mild thrombocytopenia     osteopenia   Past Surgical History:    Reviewed history from 07/16/2007 and no changes required:       Cholecystectomy       Tubal ligation       Shoulder surgery- fall        Colonoscopy (09/2004)       Carotid doppler- 39 % stenosis (03/2004)       Echo- mild LVH, EF 60%       Back and shoulder surgery, fall at work (2004)       L5- S1 bulging disk (11/2006)   Family History:    Reviewed history from 07/09/2008 and no changes required:       Father: CAD       Mother: pacemaker       Siblings: sister- lung cancer       MGM breast ca       sister with DM        sister OP   Social History:    Reviewed history from 07/09/2008 and no changes required:       Marital Status: Married       Children: 4 daughters       Occupation: -- Chief Financial Officer        non smoker     Review of Systems  General      Denies fatigue, fever, loss of appetite, and  malaise.  Eyes      Denies blurring and eye pain.  CV      Denies chest pain or discomfort, lightheadness, palpitations, and shortness of breath with exertion.  Resp      Denies cough and wheezing.  GI      Denies abdominal pain, bloody stools, change in bowel habits, and dark tarry stools.  MS      Denies joint pain.  Derm      Denies itching, lesion(s), and rash.  Neuro      Denies numbness and tingling.  Endo      Denies cold intolerance, heat intolerance, and weight change.   Physical Exam  General:  overweight but generally well appearing  Head:     normocephalic, atraumatic, and no abnormalities observed.   Eyes:     vision grossly intact, pupils equal, pupils round, and pupils reactive to light.  no conj pallor  Mouth:     pharynx pink and moist.   Neck:     supple with full rom and no masses or thyromegally, no JVD or carotid bruit  Chest Wall:     No deformities, masses, or tenderness noted. Lungs:     Normal respiratory effort, chest expands symmetrically. Lungs are clear to auscultation, no crackles or wheezes. Heart:     Normal rate and regular rhythm. S1 and S2 normal without gallop, murmur, click, rub or other extra sounds. Abdomen:     soft and non-tender.   Msk:     no kyphosis or acute joint changes  Pulses:     R and L carotid,radial,femoral,dorsalis pedis and posterior tibial pulses are full and equal bilaterally Extremities:     No clubbing, cyanosis, edema, or deformity noted with normal full range of motion of all joints.   Neurologic:     gait normal and DTRs symmetrical and normal.   Skin:     no ecchymosis or rash  Cervical Nodes:     No lymphadenopathy noted Inguinal Nodes:     No significant adenopathy Psych:     normal affect, talkative and pleasant     Impression & Recommendations:  Problem # 1:  THROMBOCYTOPENIA (ICD-287.5) Assessment: Unchanged mild and asymptomatic - in pt with low dose asa re check today rec  heme consult if worse  also watch slt low wbc  Orders: Venipuncture (01093) TLB-CBC Platelet - w/Differential (85025-CBCD)   Problem # 2:  OSTEOPENIA (ICD-733.90) Assessment: New new dx with Ts - 1.7 at spine disc opt ca and vit D (vit D level check today) disc imp of wt bearing exercise will tx - with fosamax- disc poss side eff plan next dexa in 2 y Orders: Venipuncture (23557) T-Vitamin D (25-Hydroxy) (32202-54270) TLB-CBC Platelet - w/Differential (85025-CBCD)  Her updated medication list for this problem includes:    Caltrate 600+d 600-400 Mg-unit Tabs (Calcium carbonate-vitamin d) ..... One by mouth two times a day    Fosamax 70 Mg Tabs (Alendronate sodium) .Marland Kitchen... 1 by mouth once weekly as directed   Complete Medication List: 1)  Adult Aspirin Low Strength 81 Mg Tbdp (Aspirin) .... One by mouth qd 2)  Caduet 5-20 Mg Tabs (Amlodipine-atorvastatin) .... One by mouth once daily 3)  Aleve 220 Mg Tabs (Naproxen sodium) .... Two by mouth qd 4)  Micardis Hct 80-12.5 Mg Tabs (Telmisartan-hctz) .... 1/2 by mouth once daily 5)  Caltrate 600+d 600-400 Mg-unit Tabs (Calcium carbonate-vitamin d) .... One by mouth two times a day 6)  Fosamax 70 Mg Tabs (Alendronate sodium) .Marland Kitchen.. 1 by mouth once weekly as directed   Patient Instructions: 1)  I am watching your platelet and wbc count- labs today 2)  update me if you notice easy bleeding  3)  start fosamax weekly- update me if any side eff or problems  4)  the current recommendation for calcium intake is 1200-1500 mg daily with [phone] IU of vitamin D    Prescriptions: FOSAMAX 70 MG TABS (ALENDRONATE SODIUM) 1 by mouth once weekly as directed  #4 x 11   Entered and Authorized by:   Judith Part MD   Signed by:   Judith Part MD on 11/10/2008   Method  used:   Print then Give to Patient   RxID:   [id number]  ]

## 2011-01-10 NOTE — Consult Note (Signed)
Summary: [facility]  [facility]   Imported By: Lanelle Bal 07/29/2009 09:11:32  _____________________________________________________________________  External Attachment:    Type:   Image     Comment:   External Document

## 2011-01-10 NOTE — Assessment & Plan Note (Signed)
Summary: CPX   Vital Signs:  Patient Profile:   65 Years Old Female Weight:      202 pounds Temp:     98.1 degrees F oral Pulse rate:   80 / minute Pulse rhythm:   regular BP sitting:   130 / 80  (left arm) Cuff size:   large  Vitals Entered By: Liane Comber (July 09, 2008 2:15 PM)                 Chief Complaint:  cpx.  History of Present Illness: has had a busy summer working -- not doing a whole lot else just got a new position  is feeling ok   is eating healthy is walking almost 2 miles per day   lsbs in april chol 171, HDL 40.5, and LDL 109  sugar nl 95 on caduet and healthy diet lots of vegetable   needs mam last pap/gyn exam was 2006 has never had a bone density test  not on ca or vitamin D  ? sister with OP  does not usually get flu shots  has never had shingles      Current Allergies (reviewed today): No known allergies   Past Medical History:    Reviewed history from 07/16/2007 and no changes required:       Congestive heart failure       Coronary artery disease       Hypertension  Past Surgical History:    Reviewed history from 07/16/2007 and no changes required:       Cholecystectomy       Tubal ligation       Shoulder surgery- fall        Colonoscopy (09/2004)       Carotid doppler- 39 % stenosis (03/2004)       Echo- mild LVH, EF 60%       Back and shoulder surgery, fall at work (2004)       L5- S1 bulging disk (11/2006)   Family History:    Reviewed history from 07/16/2007 and no changes required:       Father: CAD       Mother: pacemaker       Siblings: sister- lung cancer       MGM breast ca       sister with DM        sister OP   Social History:    Reviewed history from 07/16/2007 and no changes required:       Marital Status: Married       Children: 4 daughters       Occupation: -- Chief Financial Officer        non smoker    Risk Factors:  Colonoscopy History:     Date of Last Colonoscopy:  10/01/2004    Results:   normal    Review of Systems  General      Denies fatigue, fever, loss of appetite, malaise, and weight loss.  Eyes      Denies blurring and eye pain.  ENT      Denies sinus pressure and sore throat.  CV      Denies chest pain or discomfort, lightheadness, and near fainting.  GI      Denies change in bowel habits, loss of appetite, and nausea.  GU      Denies discharge and dysuria.  Derm      Denies changes in color of skin, itching, lesion(s), and rash.  Neuro  Denies numbness, tingling, and weakness.  Psych      Denies anxiety and depression.  Endo      Denies cold intolerance, excessive thirst, excessive urination, and heat intolerance.   Physical Exam  General:     alert, well-developed, well-nourished, and well-hydrated.  NAD Head:     normocephalic, atraumatic, and no abnormalities observed.   Eyes:     vision grossly intact, pupils equal, pupils round, and pupils reactive to light.   Ears:     R ear normal and L ear normal.   Nose:     no nasal discharge.   Mouth:     pharynx pink and moist.   Neck:     supple with full rom and no masses or thyromegally, no JVD or carotid bruit  Chest Wall:     No deformities, masses, or tenderness noted. Breasts:     No mass, nodules, thickening, tenderness, bulging, retraction, inflamation, nipple discharge or skin changes noted.   Lungs:     Normal respiratory effort, chest expands symmetrically. Lungs are clear to auscultation, no crackles or wheezes. Heart:     Normal rate and regular rhythm. S1 and S2 normal without gallop, murmur, click, rub or other extra sounds. Abdomen:     Bowel sounds positive,abdomen soft and non-tender without masses, organomegaly or hernias   no renal bruits  Rectal:     No external abnormalities noted. Normal sphincter tone. No rectal masses or tenderness.- heme neg stool Genitalia:     Normal introitus for age, no external lesions, no vaginal discharge, mucosa pink and moist,  no vaginal or cervical lesions, no vaginal atrophy, no friaility or hemorrhage, normal uterus size and position, no adnexal masses or tenderness Msk:     No deformity or scoliosis noted of thoracic or lumbar spine.  no acute joint changes  Pulses:     R and L carotid,radial,femoral,dorsalis pedis and posterior tibial pulses are full and equal bilaterally Extremities:     No clubbing, cyanosis, edema, or deformity noted with normal full range of motion of all joints.   Neurologic:     sensation intact to light touch, gait normal, and DTRs symmetrical and normal.   Skin:     Intact without suspicious lesions or rashes lentigos diffusely  Cervical Nodes:     No lymphadenopathy noted Axillary Nodes:     No palpable lymphadenopathy Inguinal Nodes:     No significant adenopathy Psych:     normal affect, talkative and pleasant     Impression & Recommendations:  Problem # 1:  HEALTH MAINTENANCE EXAM (ICD-V70.0) Assessment: Comment Only reviewed health habits including diet, exercise and skin cancer prevention reviewed health maintenance list and family history disc wt loss for better health- and to decrease DM risks  Orders: Venipuncture (84696) TLB-CBC Platelet - w/Differential (85025-CBCD) TLB-TSH (Thyroid Stimulating Hormone) (84443-TSH)   Problem # 2:  ROUTINE GYNECOLOGICAL EXAMINATION (ICD-V72.31) Assessment: Comment Only exam with pap done- no acute changes   Problem # 3:  OTHER SCREENING MAMMOGRAM (ICD-V76.12) Assessment: Comment Only sched screen mam enc regular self breast exams  no changes on today's exam Orders: Radiology Referral (Radiology)   Problem # 4:  POSTMENOPAUSAL STATUS (ICD-627.2) Assessment: Comment Only with fam hx of op disc rec for ca and vit D sched screening dexa  Orders: Radiology Referral (Radiology)   Problem # 5:  Hx of HYPERCHOLESTEROLEMIA (ICD-272.0) Assessment: Unchanged has been fairly controlled with statin and diet enc good  exerise disc goal  of LDL at or below 100- optimally Her updated medication list for this problem includes:    Caduet 5-20 Mg Tabs (Amlodipine-atorvastatin) ..... One by mouth once daily  Labs Reviewed: Chol: 171 (04/08/2008)   HDL: 40.5 (04/08/2008)   LDL: 109 (04/08/2008)   TG: 106 (04/08/2008) SGOT: 22 (04/08/2008)   SGPT: 19 (04/08/2008)   Problem # 6:  HYPERTENSION (ICD-401.9) Assessment: Unchanged bp is fairly controlled on caduet, miacardis hct  adv to continue exercise and workon wt loss Her updated medication list for this problem includes:    Caduet 5-20 Mg Tabs (Amlodipine-atorvastatin) ..... One by mouth once daily    Micardis Hct 80-12.5 Mg Tabs (Telmisartan-hctz) .Marland Kitchen... 1/2 by mouth once daily  BP today: 130/80 Prior BP: 137/105 (04/02/2008)  Labs Reviewed: Creat: 1.0 (04/08/2008) Chol: 171 (04/08/2008)   HDL: 40.5 (04/08/2008)   LDL: 109 (04/08/2008)   TG: 106 (04/08/2008)   Complete Medication List: 1)  Adult Aspirin Low Strength 81 Mg Tbdp (Aspirin) .... One by mouth qd 2)  Caduet 5-20 Mg Tabs (Amlodipine-atorvastatin) .... One by mouth once daily 3)  Aleve 220 Mg Tabs (Naproxen sodium) .... Two by mouth qd 4)  Micardis Hct 80-12.5 Mg Tabs (Telmisartan-hctz) .... 1/2 by mouth once daily   Patient Instructions: 1)  the current recommendation for calcium intake is 1200-1500 mg daily with [phone] IU of vitamin D  2)  keep up the exercise  3)  if you are interested in a shingles vaccine in the future (zostavax)- check with your insurance- then call us to schedule  4)  we will set up mammogram and dexa at check out    Prescriptions: MICARDIS HCT 80-12.5 MG  TABS (TELMISARTAN-HCTZ) 1/2 by mouth once daily  #30 x 11   Entered and Authorized by:   Judith Part MD   Signed by:   Judith Part MD on 07/09/2008   Method used:   Print then Give to Patient   RxID:   [id number] CADUET 5-20 MG  TABS (AMLODIPINE-ATORVASTATIN) one by mouth once daily  #30 x  11   Entered and Authorized by:   Judith Part MD   Signed by:   Judith Part MD on 07/09/2008   Method used:   Print then Give to Patient   RxID:   [id number]  ]  Preventive Care Screening  Colonoscopy:    Date:  10/01/2004    Next Due:  10/2014    Results:  normal

## 2011-01-10 NOTE — Progress Notes (Signed)
Summary: chol rx  Phone Note Call from Patient   Caller: pt here for labs Call For: tower Summary of Call: pt having labs today to check chol, she runs out of chol med today does not have any for tomorrow, needs results asap and rx called in to [facility] pharmacy. Initial call taken by: Liane Comber,  April 08, 2008 8:51 AM  Follow-up for Phone Call        px written on EMR for call in  Follow-up by: Judith Part MD,  April 08, 2008 1:37 PM  Additional Follow-up for Phone Call Additional follow up Details #1::        called to [facility], advised pt- [facility] Additional Follow-up by: Lowella Petties,  April 08, 2008 2:19 PM      Prescriptions: CADUET 5-20 MG  TABS (AMLODIPINE-ATORVASTATIN) one by mouth qd  #30 x 11   Entered and Authorized by:   Judith Part MD   Signed by:   Lowella Petties on 04/08/2008   Method used:   Telephoned to ...         RxID:   [id number]

## 2011-01-10 NOTE — Assessment & Plan Note (Signed)
Summary: STOMACH PAIN/CLE   Vital Signs:  Patient profile:   65 year old female Height:      64 inches Weight:      206 pounds BMI:     35.49 Temp:     97.9 degrees F oral Pulse rate:   64 / minute Pulse rhythm:   regular BP sitting:   120 / 84  (left arm) Cuff size:   large  Vitals Entered By: Liane Comber (Apr 14, 2009 3:35 PM)  History of Present Illness: some really bad pain in upper abd  feels like severe heartburn- is keeping her up at night  tried rolaids , gas-ex and pepto these help for 1 hour- then symptoms return diet has not changed   in past peanut butter makes her worse   takes low dose aspirin  takes aleve 2 pills daily - for her arthritis   no dark colored stool    Allergies (verified): 1)  ! * Aleve  Past History:  Past Medical History:    Congestive heart failure    Coronary artery disease    Hypertension    mild thrombocytopenia     osteopenia      (11/10/2008)  Past Surgical History:    Cholecystectomy    Tubal ligation    Shoulder surgery- fall     Colonoscopy (09/2004)    Carotid doppler- 39 % stenosis (03/2004)    Echo- mild LVH, EF 60%    Back and shoulder surgery, fall at work (2004)    L5- S1 bulging disk (11/2006)     (07/16/2007)  Family History:    Father: CAD    Mother: pacemaker    Siblings: sister- lung cancer    MGM breast ca    sister with DM     sister OP  (07/09/2008)  Social History:    Marital Status: Married    Children: 4 daughters    Occupation: -- Chief Financial Officer     non smoker  (07/09/2008)  Review of Systems General:  Denies chills, fatigue, fever, loss of appetite, and malaise. CV:  Denies chest pain or discomfort, palpitations, and shortness of breath with exertion. Resp:  Denies cough and wheezing. GI:  Complains of abdominal pain, indigestion, and nausea; denies bloody stools, constipation, dark tarry stools, diarrhea, and vomiting. GU:  Denies dysuria. Psych:  no new stress . Heme:  Denies  abnormal bruising, bleeding, and pallor.  Physical Exam  General:  overweight but generally well appearing  Head:  normocephalic, atraumatic, and no abnormalities observed.   Eyes:  vision grossly intact, pupils equal, pupils round, and pupils reactive to light.  no conj pallor  Mouth:  pharynx pink and moist.   Neck:  supple with full rom and no masses or thyromegally, no JVD or carotid bruit  Lungs:  Normal respiratory effort, chest expands symmetrically. Lungs are clear to auscultation, no crackles or wheezes. Heart:  Normal rate and regular rhythm. S1 and S2 normal without gallop, murmur, click, rub or other extra sounds. Abdomen:  epigastric tenderness without rebound or gaurding  soft, normal bowel sounds, no distention, no masses, no hepatomegaly, and no splenomegaly.   Msk:  No deformity or scoliosis noted of thoracic or lumbar spine.   Extremities:  No clubbing, cyanosis, edema, or deformity noted with normal full range of motion of all joints.   Skin:  Intact without suspicious lesions or rashes no pallor or jaundice  Cervical Nodes:  No lymphadenopathy noted Inguinal Nodes:  No  significant adenopathy Psych:  normal affect, talkative and pleasant    Impression & Recommendations:  Problem # 1:  GASTRITIS (ICD-535.50) Assessment New suspect nsaid induced  hold aleve (needs to continue asa in light of CAD) start omeprazole 20 once daily  hold fosamax until f/u  bland diet- avoid spicy foods and caffiene  adv to update if worse or new symptoms  f/u next mo as planned  Her updated medication list for this problem includes:    Omeprazole 20 Mg Tbec (Omeprazole) .Marland Kitchen... 1 by mouth once daily in am 30 minutes before breakfast  Complete Medication List: 1)  Adult Aspirin Low Strength 81 Mg Tbdp (Aspirin) .... One by mouth qd 2)  Caduet 5-20 Mg Tabs (Amlodipine-atorvastatin) .... One by mouth once daily 3)  Micardis Hct 80-12.5 Mg Tabs (Telmisartan-hctz) .... 1/2 by mouth once  daily 4)  Caltrate 600+d 600-400 Mg-unit Tabs (Calcium carbonate-vitamin d) .... One by mouth two times a day 5)  Fosamax 70 Mg Tabs (Alendronate sodium) .Marland Kitchen.. 1 by mouth once weekly as directed 6)  Omeprazole 20 Mg Tbec (Omeprazole) .Marland Kitchen.. 1 by mouth once daily in am 30 minutes before breakfast  Patient Instructions: 1)  hold your fosamax until next visit  2)  stop aleve - and avoid any over the counter pain medicines except tylenol  3)  continue the baby aspirin  4)  start omeprazole - 1 st dose now- then every am before breakfast  5)  update me if your symptoms worsen  6)  follow up as planned next month Prescriptions: OMEPRAZOLE 20 MG TBEC (OMEPRAZOLE) 1 by mouth once daily in am 30 minutes before breakfast  #30 x 3   Entered and Authorized by:   Judith Part MD   Signed by:   Judith Part MD on 04/14/2009   Method used:   Print then Give to Patient   RxID:   [id number]     Prior Medications (reviewed today): ADULT ASPIRIN LOW STRENGTH 81 MG  TBDP (ASPIRIN) one by mouth qd CADUET 5-20 MG  TABS (AMLODIPINE-ATORVASTATIN) one by mouth once daily MICARDIS HCT 80-12.5 MG  TABS (TELMISARTAN-HCTZ) 1/2 by mouth once daily CALTRATE 600+D 600-400 MG-UNIT TABS (CALCIUM CARBONATE-VITAMIN D) one by mouth two times a day FOSAMAX 70 MG TABS (ALENDRONATE SODIUM) 1 by mouth once weekly as directed Current Allergies (reviewed today): ! * ALEVE Current Medications (including changes made in today's visit):  ADULT ASPIRIN LOW STRENGTH 81 MG  TBDP (ASPIRIN) one by mouth qd CADUET 5-20 MG  TABS (AMLODIPINE-ATORVASTATIN) one by mouth once daily MICARDIS HCT 80-12.5 MG  TABS (TELMISARTAN-HCTZ) 1/2 by mouth once daily CALTRATE 600+D 600-400 MG-UNIT TABS (CALCIUM CARBONATE-VITAMIN D) one by mouth two times a day FOSAMAX 70 MG TABS (ALENDRONATE SODIUM) 1 by mouth once weekly as directed OMEPRAZOLE 20 MG TBEC (OMEPRAZOLE) 1 by mouth once daily in am 30 minutes before breakfast

## 2011-01-12 NOTE — Letter (Signed)
Summary:  Cancer Center  [facility]   Imported By: Lester Wallace 11/17/2010 11:49:32  _____________________________________________________________________  External Attachment:    Type:   Image     Comment:   External Document

## 2011-02-18 ENCOUNTER — Encounter: Payer: Self-pay | Admitting: Family Medicine

## 2011-03-09 ENCOUNTER — Other Ambulatory Visit: Payer: Self-pay | Admitting: Family Medicine

## 2011-04-10 ENCOUNTER — Telehealth: Payer: Self-pay | Admitting: Family Medicine

## 2011-04-10 DIAGNOSIS — I10 Essential (primary) hypertension: Secondary | ICD-10-CM

## 2011-04-10 DIAGNOSIS — D696 Thrombocytopenia, unspecified: Secondary | ICD-10-CM

## 2011-04-10 DIAGNOSIS — E7800 Pure hypercholesterolemia, unspecified: Secondary | ICD-10-CM

## 2011-04-10 DIAGNOSIS — M899 Disorder of bone, unspecified: Secondary | ICD-10-CM

## 2011-04-10 NOTE — Telephone Encounter (Signed)
Message copied by Roxy Manns on Mon Apr 10, 2011 10:11 PM ------      Message from: Liane Comber      Created: Mon Apr 10, 2011 11:20 AM      Regarding: Cpx labs fri       Please order  future cpx labs for pt's upcomming lab appt.      Thanks      Rodney Booze

## 2011-04-14 ENCOUNTER — Other Ambulatory Visit (INDEPENDENT_AMBULATORY_CARE_PROVIDER_SITE_OTHER): Payer: PRIVATE HEALTH INSURANCE | Admitting: Family Medicine

## 2011-04-14 DIAGNOSIS — M899 Disorder of bone, unspecified: Secondary | ICD-10-CM

## 2011-04-14 DIAGNOSIS — D696 Thrombocytopenia, unspecified: Secondary | ICD-10-CM

## 2011-04-14 DIAGNOSIS — I10 Essential (primary) hypertension: Secondary | ICD-10-CM

## 2011-04-14 DIAGNOSIS — E7800 Pure hypercholesterolemia, unspecified: Secondary | ICD-10-CM

## 2011-04-14 LAB — COMPREHENSIVE METABOLIC PANEL
ALT: 15 U/L (ref 0–35)
AST: 17 U/L (ref 0–37)
Albumin: 4.2 g/dL (ref 3.5–5.2)
Alkaline Phosphatase: 72 U/L (ref 39–117)
BUN: 18 mg/dL (ref 6–23)
CO2: 30 mEq/L (ref 19–32)
Calcium: 9.7 mg/dL (ref 8.4–10.5)
Chloride: 106 mEq/L (ref 96–112)
Creatinine, Ser: 0.9 mg/dL (ref 0.4–1.2)
GFR: 65.89 mL/min (ref >60.00)
Glucose, Bld: 101 mg/dL — ABNORMAL HIGH (ref 70–99)
Potassium: 4.3 mEq/L (ref 3.5–5.1)
Sodium: 143 mEq/L (ref 135–145)
Total Bilirubin: 0.6 mg/dL (ref 0.3–1.2)
Total Protein: 6.7 g/dL (ref 6.0–8.3)

## 2011-04-14 LAB — CBC WITH DIFFERENTIAL/PLATELET
Basophils Absolute: 0 10*3/uL (ref 0.0–0.1)
Basophils Relative: 0.7 % (ref 0.0–3.0)
Eosinophils Absolute: 0.2 10*3/uL (ref 0.0–0.7)
Eosinophils Relative: 4.3 % (ref 0.0–5.0)
HCT: 41 % (ref 36.0–46.0)
Hemoglobin: 14 g/dL (ref 12.0–15.0)
Lymphocytes Relative: 27.2 % (ref 12.0–46.0)
Lymphs Abs: 1.2 10*3/uL (ref 0.7–4.0)
MCHC: 34.3 g/dL (ref 30.0–36.0)
MCV: 91 fl (ref 78.0–100.0)
Monocytes Absolute: 0.3 10*3/uL (ref 0.1–1.0)
Monocytes Relative: 6 % (ref 3.0–12.0)
Neutro Abs: 2.6 10*3/uL (ref 1.4–7.7)
Neutrophils Relative %: 61.8 % (ref 43.0–77.0)
Platelets: 138 10*3/uL — ABNORMAL LOW (ref 150.0–400.0)
RBC: 4.5 Mil/uL (ref 3.87–5.11)
RDW: 13.1 % (ref 11.5–14.6)
WBC: 4.3 10*3/uL — ABNORMAL LOW (ref 4.5–10.5)

## 2011-04-14 LAB — LIPID PANEL
Cholesterol: 166 mg/dL (ref 0–200)
HDL: 48.8 mg/dL (ref >39.00)
LDL Cholesterol: 90 mg/dL (ref 0–99)
Total CHOL/HDL Ratio: 3
Triglycerides: 135 mg/dL (ref 0.0–149.0)
VLDL: 27 mg/dL (ref 0.0–40.0)

## 2011-04-14 LAB — TSH: TSH: 2.93 u[IU]/mL (ref 0.35–5.50)

## 2011-04-15 LAB — VITAMIN D 25 HYDROXY (VIT D DEFICIENCY, FRACTURES): Vit D, 25-Hydroxy: 53 ng/mL (ref 30–89)

## 2011-04-19 ENCOUNTER — Other Ambulatory Visit (HOSPITAL_COMMUNITY)
Admission: RE | Admit: 2011-04-19 | Discharge: 2011-04-19 | Disposition: A | Discharge status: Home / Self Care | Payer: PRIVATE HEALTH INSURANCE | Source: Ambulatory Visit | Attending: Family Medicine | Admitting: Family Medicine

## 2011-04-19 ENCOUNTER — Encounter: Payer: Self-pay | Admitting: Family Medicine

## 2011-04-19 ENCOUNTER — Ambulatory Visit (INDEPENDENT_AMBULATORY_CARE_PROVIDER_SITE_OTHER): Payer: PRIVATE HEALTH INSURANCE | Admitting: Family Medicine

## 2011-04-19 DIAGNOSIS — Z01419 Encounter for gynecological examination (general) (routine) without abnormal findings: Secondary | ICD-10-CM | POA: Insufficient documentation

## 2011-04-19 DIAGNOSIS — D696 Thrombocytopenia, unspecified: Secondary | ICD-10-CM

## 2011-04-19 DIAGNOSIS — Z1231 Encounter for screening mammogram for malignant neoplasm of breast: Secondary | ICD-10-CM

## 2011-04-19 DIAGNOSIS — M899 Disorder of bone, unspecified: Secondary | ICD-10-CM

## 2011-04-19 DIAGNOSIS — Z1159 Encounter for screening for other viral diseases: Secondary | ICD-10-CM | POA: Insufficient documentation

## 2011-04-19 DIAGNOSIS — I10 Essential (primary) hypertension: Secondary | ICD-10-CM

## 2011-04-19 DIAGNOSIS — E7800 Pure hypercholesterolemia, unspecified: Secondary | ICD-10-CM

## 2011-04-19 MED ORDER — CALCITONIN (SALMON) 200 UNIT/ACT NA SOLN: 1.0000 | Freq: Every day | NASAL | Status: DC

## 2011-04-19 MED ORDER — AMLODIPINE-ATORVASTATIN 5-20 MG PO TABS: 1.0000 | ORAL_TABLET | Freq: Every day | ORAL | Status: DC

## 2011-04-19 MED ORDER — TELMISARTAN-HCTZ 80-12.5 MG PO TABS: 0.5000 | ORAL_TABLET | Freq: Every day | ORAL | Status: DC

## 2011-04-19 MED ORDER — PANTOPRAZOLE SODIUM 40 MG PO TBEC: 40.0000 mg | DELAYED_RELEASE_TABLET | Freq: Every day | ORAL | Status: DC

## 2011-04-19 NOTE — Assessment & Plan Note (Signed)
Platelet count is stable in 130s No bleeding or bruising  Will continue to monitor

## 2011-04-19 NOTE — Progress Notes (Signed)
Subjective:    Patient ID: Natasha [name], female    DOB: Jun 28, 1946, 65 y.o.   MRN: [id number]  HPI Here for check up of chronic medical problems and to review health mt list  Is doing well overall  Turned 65  Aches and pains -- are part of the deal -- stiff after inactivity  Does not do any exercise - has a bulging disc and had a shot in January -- helps for a while   Exercise is a challenge -- moderate paced walking   Zoster status- never had it  Not interested in it   Pneumovax  - has to check with her insurance  Flu shots- does not get but will think about it  Td1/04  Mam was last year - is due for that (had mam last year)  Self exam -- no lumps or changes   colonosc 10/05  - was negative -- recommended 10 y f/u No fam hx of colon cancer   Dexa was 2 years ago -- is due  Pt wants to wait another year on that  Openia -hx of  Ca and D Vit D level is 53-good - at current dose   Lipids are stable with HDl of 48 and LDL 90  Does eat a very healthy diet  Has gotten rid of sweets and sweet drinks entirely -- was a challenge  Lab Results  Component Value Date   CHOL 166 04/14/2011   CHOL 176 03/14/2010   CHOL 171 04/08/2008   Lab Results  Component Value Date   HDL 48.80 04/14/2011   HDL 55.40 03/14/2010   HDL 40.5 04/08/2008   Lab Results  Component Value Date   LDLCALC 90 04/14/2011   LDLCALC 97 03/14/2010   LDLCALC 109* 04/08/2008   Lab Results  Component Value Date   TRIG 135.0 04/14/2011   TRIG 116.0 03/14/2010   TRIG 106 04/08/2008   Lab Results  Component Value Date   CHOLHDL 3 04/14/2011   CHOLHDL 3 03/14/2010   CHOLHDL 4.2 CALC 04/08/2008   No results found for this basename: LDLDIRECT     Platelets are stable at 138 No bleeding or bruising    HTN in good control at 120/80  Gyn exam -- ? When last one was  Still has uterus and ovaries   Past Medical History  Diagnosis Date  . CHF (congestive heart failure)   . CAD (coronary artery disease)     (4/05 carotid  dopler- 39% stenosis) , (ECHO- mild LVH, EF 60%)  . Hypertension   . Hyperlipidemia   . Thrombocytopenia     mild  . Osteopenia   . DDD (degenerative disc disease), lumbar 11/2006    L5-S1 bulging disk    Past Surgical History  Procedure Date  . Cholecystectomy   . Tubal ligation   . Shoulder surgery 2004    fall  . Back surgery 2004    fall at work    History   Social History  . Marital Status: Married    Spouse Name: N/A    Number of Children: 4  . Years of Education: N/A   Occupational History  . marketing [employer]   Social History Main Topics  . Smoking status: Never Smoker   . Smokeless tobacco: Not on file  . Alcohol Use: Not on file  . Drug Use: Not on file  . Sexually Active: Not on file   Other Topics Concern  . Not on file  Social History Narrative   4 daughter    Family History  Problem Relation Age of Onset  . Coronary artery disease Father   . Lung cancer Sister   . Breast cancer Maternal Grandmother   . Diabetes Sister   . Other Sister     OP  . Other Mother     has pacemaker    Allergies  Allergen Reactions  . Naproxen Sodium     REACTION: stomach pain        Review of Systems Review of Systems  Constitutional: Negative for fever, appetite change, fatigue and unexpected weight change.  Eyes: Negative for pain and visual disturbance.  Respiratory: Negative for cough and shortness of breath.   Cardiovascular: Negative.   Gastrointestinal: Negative for nausea, diarrhea and constipation.  Genitourinary: Negative for urgency and frequency.  Skin: Negative for pallor.  Neurological: Negative for weakness, light-headedness, numbness and headaches.  Hematological: Negative for adenopathy. Does not bruise/bleed easily.  Psychiatric/Behavioral: Negative for dysphoric mood. The patient is not nervous/anxious.   msk pos for aches and pains of joints with stiffness but no swelling         Objective:   Physical Exam    Constitutional: She appears well-developed and well-nourished. No distress.       overwt and well appearing   HENT:  Head: Normocephalic and atraumatic.  Right Ear: External ear normal.  Left Ear: External ear normal.  Nose: Nose normal.  Mouth/Throat: Oropharynx is clear and moist.  Eyes: Conjunctivae and EOM are normal. Pupils are equal, round, and reactive to light.  Neck: Normal range of motion. Neck supple. No JVD present. Carotid bruit is not present. No thyromegaly present.  Cardiovascular: Normal rate, regular rhythm and normal heart sounds.   No murmur heard. Pulmonary/Chest: Effort normal and breath sounds normal. No respiratory distress. She has no wheezes. She has no rales.  Abdominal: Soft. Bowel sounds are normal. She exhibits no distension, no abdominal bruit and no mass. There is no tenderness.  Genitourinary: Vagina normal and uterus normal. No breast swelling, tenderness, discharge or bleeding. No vaginal discharge found.       Mild cystocele noted   Musculoskeletal: She exhibits no edema and no tenderness.  Lymphadenopathy:    She has no cervical adenopathy.  Neurological: She is alert. She has normal reflexes. No cranial nerve deficit. Coordination normal.  Skin: Skin is warm and dry. No rash noted. No erythema. No pallor.  Psychiatric: She has a normal mood and affect.          Assessment & Plan:

## 2011-04-19 NOTE — Assessment & Plan Note (Signed)
Nl breast exam today Mam sched  Enc monthly self exams

## 2011-04-19 NOTE — Assessment & Plan Note (Signed)
Currently well controlled with diet Rev low sat fat diet  Enc to start low impact exercise

## 2011-04-19 NOTE — Assessment & Plan Note (Signed)
Pt declines her dexa this year Disc ca and D Needs to consider exercise

## 2011-04-19 NOTE — Patient Instructions (Signed)
Check out the cost of pneumonia vaccine and let us know if you want one We will schedule mammogram at check out  Continue calcium and vitaminD Consider some low impact exercise -- walking or water exercise

## 2011-04-19 NOTE — Assessment & Plan Note (Signed)
Exam with pap  Cystocele noted  Difficult to vis os due to atrophy and prolapse  No M on bimanual

## 2011-04-19 NOTE — Assessment & Plan Note (Signed)
HTN is stable and well controlled meds refilled Disc opt for exercise

## 2011-04-20 ENCOUNTER — Ambulatory Visit: Payer: Self-pay | Admitting: Family Medicine

## 2011-04-20 LAB — HM MAMMOGRAPHY: HM Mammogram: NEGATIVE

## 2011-05-05 ENCOUNTER — Encounter: Payer: Self-pay | Admitting: *Deleted

## 2011-11-09 ENCOUNTER — Other Ambulatory Visit: Payer: PRIVATE HEALTH INSURANCE | Admitting: Lab

## 2011-11-09 ENCOUNTER — Ambulatory Visit: Payer: PRIVATE HEALTH INSURANCE | Admitting: Oncology

## 2012-04-22 ENCOUNTER — Other Ambulatory Visit: Payer: Self-pay | Admitting: Family Medicine

## 2012-04-24 ENCOUNTER — Telehealth: Payer: Self-pay | Admitting: Family Medicine

## 2012-04-24 NOTE — Telephone Encounter (Signed)
Will see her then 

## 2012-04-24 NOTE — Telephone Encounter (Signed)
Caller: Meri/Patient; PCP: Roxy Manns A.; CB#: [phone]; Call regarding BP Concerns; BP 153/92 this AM. BP now 171/93.  Mild HA with relief with Ibuprofen 200mg . Vision clear, no CP. She has an appt sched for 0815 on 04/26/12. To keep this appt per HTN Protocol. Call back parameters reviewed.

## 2012-04-26 ENCOUNTER — Encounter: Payer: Self-pay | Admitting: Family Medicine

## 2012-04-26 ENCOUNTER — Ambulatory Visit (INDEPENDENT_AMBULATORY_CARE_PROVIDER_SITE_OTHER): Payer: Medicare Other | Admitting: Family Medicine

## 2012-04-26 VITALS — BP 112/72 | HR 62 | Temp 97.6°F | Ht 64.75 in | Wt 206.8 lb

## 2012-04-26 DIAGNOSIS — I10 Essential (primary) hypertension: Secondary | ICD-10-CM

## 2012-04-26 DIAGNOSIS — R519 Headache, unspecified: Secondary | ICD-10-CM | POA: Insufficient documentation

## 2012-04-26 DIAGNOSIS — R51 Headache: Secondary | ICD-10-CM

## 2012-04-26 DIAGNOSIS — J309 Allergic rhinitis, unspecified: Secondary | ICD-10-CM | POA: Insufficient documentation

## 2012-04-26 LAB — CBC WITH DIFFERENTIAL/PLATELET
Basophils Absolute: 0 10*3/uL (ref 0.0–0.1)
Basophils Relative: 0.8 % (ref 0.0–3.0)
Eosinophils Absolute: 0.1 10*3/uL (ref 0.0–0.7)
Eosinophils Relative: 4 % (ref 0.0–5.0)
HCT: 42.1 % (ref 36.0–46.0)
Hemoglobin: 14.3 g/dL (ref 12.0–15.0)
Lymphocytes Relative: 22.2 % (ref 12.0–46.0)
Lymphs Abs: 0.7 10*3/uL (ref 0.7–4.0)
MCHC: 34 g/dL (ref 30.0–36.0)
MCV: 88.9 fl (ref 78.0–100.0)
Monocytes Absolute: 0.3 10*3/uL (ref 0.1–1.0)
Monocytes Relative: 9.4 % (ref 3.0–12.0)
Neutro Abs: 2 10*3/uL (ref 1.4–7.7)
Neutrophils Relative %: 63.6 % (ref 43.0–77.0)
Platelets: 126 10*3/uL — ABNORMAL LOW (ref 150.0–400.0)
RBC: 4.73 Mil/uL (ref 3.87–5.11)
RDW: 13.3 % (ref 11.5–14.6)
WBC: 3.2 10*3/uL — ABNORMAL LOW (ref 4.5–10.5)

## 2012-04-26 LAB — TSH: TSH: 2.68 u[IU]/mL (ref 0.35–5.50)

## 2012-04-26 LAB — COMPREHENSIVE METABOLIC PANEL
ALT: 17 U/L (ref 0–35)
AST: 20 U/L (ref 0–37)
Albumin: 4.4 g/dL (ref 3.5–5.2)
Alkaline Phosphatase: 82 U/L (ref 39–117)
BUN: 23 mg/dL (ref 6–23)
CO2: 27 mEq/L (ref 19–32)
Calcium: 10 mg/dL (ref 8.4–10.5)
Chloride: 108 mEq/L (ref 96–112)
Creatinine, Ser: 1 mg/dL (ref 0.4–1.2)
GFR: 61.75 mL/min (ref >60.00)
Glucose, Bld: 105 mg/dL — ABNORMAL HIGH (ref 70–99)
Potassium: 4.6 mEq/L (ref 3.5–5.1)
Sodium: 143 mEq/L (ref 135–145)
Total Bilirubin: 1.2 mg/dL (ref 0.3–1.2)
Total Protein: 7.1 g/dL (ref 6.0–8.3)

## 2012-04-26 LAB — LIPID PANEL
Cholesterol: 154 mg/dL (ref 0–200)
HDL: 48 mg/dL (ref >39.00)
LDL Cholesterol: 84 mg/dL (ref 0–99)
Total CHOL/HDL Ratio: 3
Triglycerides: 112 mg/dL (ref 0.0–149.0)
VLDL: 22.4 mg/dL (ref 0.0–40.0)

## 2012-04-26 MED ORDER — TELMISARTAN-HCTZ 80-12.5 MG PO TABS: 0.5000 | ORAL_TABLET | Freq: Every day | ORAL | Status: DC

## 2012-04-26 MED ORDER — PANTOPRAZOLE SODIUM 40 MG PO TBEC: 40.0000 mg | DELAYED_RELEASE_TABLET | Freq: Every day | ORAL | Status: DC

## 2012-04-26 MED ORDER — AMLODIPINE-ATORVASTATIN 5-20 MG PO TABS: 1.0000 | ORAL_TABLET | Freq: Every day | ORAL | Status: DC

## 2012-04-26 MED ORDER — CALCITONIN (SALMON) 200 UNIT/ACT NA SOLN: 1.0000 | Freq: Every day | NASAL | Status: DC

## 2012-04-26 NOTE — Assessment & Plan Note (Signed)
bp in fair control at this time  No changes needed  Disc lifstyle change with low sodium diet and exercise   Was good here Went up briefly with pain episode Lab today She will continue to watch

## 2012-04-26 NOTE — Assessment & Plan Note (Signed)
With sneezing/ cong/ headaches  Will try zyrtec 10 mg otc daily and update

## 2012-04-26 NOTE — Patient Instructions (Signed)
Blood pressure is reassuring here and at home Allergies may play a role in the headache Try zyrtec 10 mg at bedtime through allergy season Update if not starting to improve in a week or if worsening

## 2012-04-26 NOTE — Assessment & Plan Note (Signed)
Per hx and exam , suspect sinus rel Will try zyrtec Also inc fluids when working outdoors Reassuring bp Watch carefully- would image if worse given age  Nl neuro exam

## 2012-04-26 NOTE — Progress Notes (Signed)
Subjective:    Patient ID: Natasha [name], female    DOB: [birth year], 66 y.o.   MRN: [id number]  HPI Here for concerns about bp   Got up wed am and had a bad headache -- bp was 153/92, and then 171/93  Then came down to one teens- 130s / 60s- 80s   Has been very well control   BP Readings from Last 3 Encounters:  04/26/12 112/72  04/19/11 120/78  03/14/10 128/76   Still has just a bit of headache - behind eyes -- a little bit of pressure Possible sinus problems Sneezing a lot  No purulent drainage or fever  Little cough   No new meds or herbs otc   Patient Active Problem List  Diagnoses  . HYPERCHOLESTEROLEMIA  . THROMBOCYTOPENIA  . BENIGN POSITIONAL VERTIGO  . HYPERTENSION  . CORONARY ARTERY DISEASE  . CONGESTIVE HEART FAILURE  . POSTMENOPAUSAL STATUS  . DEGENERATIVE DISC DISEASE, LUMBAR SPINE  . OSTEOPENIA  . MAMMOGRAM, ABNORMAL, RIGHT  . Gynecologic exam normal  . Other screening mammogram   Past Medical History  Diagnosis Date  . CHF (congestive heart failure)   . CAD (coronary artery disease)     (4/05 carotid dopler- 39% stenosis) , (ECHO- mild LVH, EF 60%)  . Hypertension   . Hyperlipidemia   . Thrombocytopenia     mild  . Osteopenia   . DDD (degenerative disc disease), lumbar 11/2006    L5-S1 bulging disk   Past Surgical History  Procedure Date  . Cholecystectomy   . Tubal ligation   . Shoulder surgery 2004    fall  . Back surgery 2004    fall at work   History  Substance Use Topics  . Smoking status: Never Smoker   . Smokeless tobacco: Not on file  . Alcohol Use: Not on file   Family History  Problem Relation Age of Onset  . Coronary artery disease Father   . Lung cancer Sister   . Breast cancer Maternal Grandmother   . Diabetes Sister   . Other Sister     OP  . Other Mother     has pacemaker   Allergies  Allergen Reactions  . Naproxen Sodium     REACTION: stomach pain   Current Outpatient Prescriptions on File Prior to Visit   Medication Sig Dispense Refill  . amLODipine-atorvastatin (CADUET) 5-20 MG per tablet TAKE 1 TABLET EVERY DAY  30 tablet  0  . aspirin 81 MG tablet Take 81 mg by mouth daily.        . calcitonin, salmon, (MIACALCIN) 200 UNIT/ACT nasal spray 1 spray by Nasal route daily. Alternate nostrils  3.7 mL  11  . Calcium Carbonate-Vitamin D (CALTRATE 600+D) 600-400 MG-UNIT per tablet Take 1 tablet by mouth 2 (two) times daily.        Marland Kitchen MICARDIS HCT 80-12.5 MG per tablet TAKE ONE-HALF TABLET DAILY  15 tablet  0  . pantoprazole (PROTONIX) 40 MG tablet TAKE 1 TABLET EVERY DAY  30 tablet  0       Review of Systems Review of Systems  Constitutional: Negative for fever, appetite change, fatigue and unexpected weight change.  Eyes: Negative for pain and visual disturbance.  ENT pos for some congestion and sneezing and sinus pain  Respiratory: Negative for cough and shortness of breath.   Cardiovascular: Negative for cp or palpitations    Gastrointestinal: Negative for nausea, diarrhea and constipation.  Genitourinary: Negative for urgency and frequency.  Skin: Negative for pallor or rash   Neurological: Negative for weakness, light-headedness, numbness and pos for   Hematological: Negative for adenopathy. Does not bruise/bleed easily.  Psychiatric/Behavioral: Negative for dysphoric mood. The patient is not nervous/anxious.         Objective:   Physical Exam  Constitutional: She appears well-developed and well-nourished. No distress.  HENT:  Head: Normocephalic and atraumatic.  Right Ear: External ear normal.  Left Ear: External ear normal.  Nose: Nose normal.  Mouth/Throat: Oropharynx is clear and moist.       Sinus tenderness frontal and ethmoid bilaterally  Eyes: Conjunctivae and EOM are normal. Pupils are equal, round, and reactive to light. No scleral icterus.  Neck: Normal range of motion. Neck supple. No JVD present. Carotid bruit is not present. No thyromegaly present.  Cardiovascular:  Normal rate, regular rhythm, normal heart sounds and intact distal pulses.  Exam reveals no gallop.   No murmur heard. Pulmonary/Chest: Effort normal and breath sounds normal. No respiratory distress. She has no wheezes.  Abdominal: Soft. Bowel sounds are normal. She exhibits no distension, no abdominal bruit and no mass. There is no tenderness.  Musculoskeletal: Normal range of motion. She exhibits no edema and no tenderness.  Lymphadenopathy:    She has no cervical adenopathy.  Neurological: She is alert. She has normal reflexes. She displays no atrophy. No cranial nerve deficit or sensory deficit. She exhibits normal muscle tone. She displays a negative Romberg sign. Coordination and gait normal.  Skin: Skin is warm and dry. No rash noted. No erythema. No pallor.  Psychiatric: She has a normal mood and affect.          Assessment & Plan:

## 2012-07-01 ENCOUNTER — Other Ambulatory Visit: Payer: Self-pay | Admitting: Family Medicine

## 2012-08-05 ENCOUNTER — Encounter: Payer: Self-pay | Admitting: Family Medicine

## 2012-08-05 ENCOUNTER — Ambulatory Visit (INDEPENDENT_AMBULATORY_CARE_PROVIDER_SITE_OTHER): Payer: Medicare Other | Admitting: Family Medicine

## 2012-08-05 VITALS — BP 126/64 | HR 69 | Temp 98.0°F | Ht 64.0 in | Wt 209.2 lb

## 2012-08-05 DIAGNOSIS — T6391XA Toxic effect of contact with unspecified venomous animal, accidental (unintentional), initial encounter: Secondary | ICD-10-CM

## 2012-08-05 DIAGNOSIS — T63461A Toxic effect of venom of wasps, accidental (unintentional), initial encounter: Secondary | ICD-10-CM

## 2012-08-05 DIAGNOSIS — T63441A Toxic effect of venom of bees, accidental (unintentional), initial encounter: Secondary | ICD-10-CM

## 2012-08-05 NOTE — Progress Notes (Signed)
Subjective:    Patient ID: Natasha [name], female    DOB: 1946/07/28, 66 y.o.   MRN: [id number]  HPI Had stings from a yellow jacket -- when out in yard push mowing last Friday  Got stung below L knee - just the one sting  Washed it well and then -- put the cream on it fluocinonide .05%  No sob or wheeze or throat or mouth swelling    Also got stung 8 weeks ago - went to a walk in clinic - got cream and a shot - not sure what   Patient Active Problem List  Diagnosis  . HYPERCHOLESTEROLEMIA  . THROMBOCYTOPENIA  . BENIGN POSITIONAL VERTIGO  . HYPERTENSION  . CORONARY ARTERY DISEASE  . CONGESTIVE HEART FAILURE  . POSTMENOPAUSAL STATUS  . DEGENERATIVE DISC DISEASE, LUMBAR SPINE  . OSTEOPENIA  . MAMMOGRAM, ABNORMAL, RIGHT  . Gynecologic exam normal  . Other screening mammogram  . Allergic rhinitis  . Headache around the eyes   Past Medical History  Diagnosis Date  . CHF (congestive heart failure)   . CAD (coronary artery disease)     (4/05 carotid dopler- 39% stenosis) , (ECHO- mild LVH, EF 60%)  . Hypertension   . Hyperlipidemia   . Thrombocytopenia     mild  . Osteopenia   . DDD (degenerative disc disease), lumbar 11/2006    L5-S1 bulging disk   Past Surgical History  Procedure Date  . Cholecystectomy   . Tubal ligation   . Shoulder surgery 2004    fall  . Back surgery 2004    fall at work   History  Substance Use Topics  . Smoking status: Never Smoker   . Smokeless tobacco: Not on file  . Alcohol Use: Not on file   Family History  Problem Relation Age of Onset  . Coronary artery disease Father   . Lung cancer Sister   . Breast cancer Maternal Grandmother   . Diabetes Sister   . Other Sister     OP  . Other Mother     has pacemaker   Allergies  Allergen Reactions  . Naproxen Sodium     REACTION: stomach pain   Current Outpatient Prescriptions on File Prior to Visit  Medication Sig Dispense Refill  . amLODipine-atorvastatin (CADUET) 5-20 MG per  tablet Take 1 tablet by mouth daily.  30 tablet  11  . aspirin 81 MG tablet Take 81 mg by mouth daily.        . calcitonin, salmon, (MIACALCIN) 200 UNIT/ACT nasal spray Place 1 spray into the nose daily. Alternate nostrils  3.7 mL  11  . Calcium Carbonate-Vitamin D (CALTRATE 600+D) 600-400 MG-UNIT per tablet Take 1 tablet by mouth 2 (two) times daily.        . pantoprazole (PROTONIX) 40 MG tablet Take 1 tablet (40 mg total) by mouth daily.  30 tablet  11  . telmisartan-hydrochlorothiazide (MICARDIS HCT) 80-12.5 MG per tablet Take 0.5 tablets by mouth daily.  15 tablet  11          Review of Systems Review of Systems  Constitutional: Negative for fever, appetite change, fatigue and unexpected weight change.  Eyes: Negative for pain and visual disturbance.  ENT neg for mouth or throat swelling  Respiratory: Negative for cough and shortness of breath.neg for wheeze   Cardiovascular: Negative for cp or palpitations    Gastrointestinal: Negative for nausea, diarrhea and constipation.  Genitourinary: Negative for urgency and frequency.  Skin: Negative  for pallor or rash  pos for redness and itching  Neurological: Negative for weakness, light-headedness, numbness and headaches.  Hematological: Negative for adenopathy. Does not bruise/bleed easily.  Psychiatric/Behavioral: Negative for dysphoric mood. The patient is not nervous/anxious.         Objective:   Physical Exam  Constitutional: She appears well-developed and well-nourished. No distress.  HENT:  Head: Normocephalic and atraumatic.  Mouth/Throat: Oropharynx is clear and moist.       No mouth/tongue or throat swelling   Eyes: Conjunctivae and EOM are normal. Pupils are equal, round, and reactive to light. Right eye exhibits no discharge. Left eye exhibits no discharge.  Neck: Normal range of motion.  Cardiovascular: Normal rate and regular rhythm.   Pulmonary/Chest: Effort normal and breath sounds normal. No respiratory  distress. She has no wheezes.  Musculoskeletal: She exhibits edema.       Edema directly around and under insect sting  Lymphadenopathy:    She has no cervical adenopathy.  Neurological: She is alert.  Skin: Skin is warm and dry. No rash noted. There is erythema. No pallor.       Round shaped area of induration and erythema under L knee without skin breakdown Some mild swelling below it  No excoriation or drainage   Psychiatric: She has a normal mood and affect.          Assessment & Plan:

## 2012-08-05 NOTE — Patient Instructions (Addendum)
Keep using the fluocinonide cream .05% It is working  The swelling should improve over the next week  Warm compress and elevation should help also  Please update me if worse or no further improvement

## 2012-08-05 NOTE — Assessment & Plan Note (Signed)
Small sting site with slt erythema below L knee - some edema in ankle that I feel is reactive No red flags for infection Adv to elevate/ use warm compresses and keep using the topical steroid cream for itching  Update if not starting to improve in a week or if worsening

## 2012-09-24 ENCOUNTER — Encounter: Payer: Self-pay | Admitting: Family Medicine

## 2012-09-24 ENCOUNTER — Ambulatory Visit (INDEPENDENT_AMBULATORY_CARE_PROVIDER_SITE_OTHER): Payer: Medicare Other | Admitting: Family Medicine

## 2012-09-24 VITALS — BP 132/76 | HR 74 | Temp 97.9°F | Ht 64.25 in | Wt 207.0 lb

## 2012-09-24 DIAGNOSIS — E7800 Pure hypercholesterolemia, unspecified: Secondary | ICD-10-CM

## 2012-09-24 DIAGNOSIS — M949 Disorder of cartilage, unspecified: Secondary | ICD-10-CM

## 2012-09-24 DIAGNOSIS — D696 Thrombocytopenia, unspecified: Secondary | ICD-10-CM

## 2012-09-24 DIAGNOSIS — Z1231 Encounter for screening mammogram for malignant neoplasm of breast: Secondary | ICD-10-CM

## 2012-09-24 DIAGNOSIS — M899 Disorder of bone, unspecified: Secondary | ICD-10-CM

## 2012-09-24 DIAGNOSIS — Z780 Asymptomatic menopausal state: Secondary | ICD-10-CM

## 2012-09-24 DIAGNOSIS — Z79899 Other long term (current) drug therapy: Secondary | ICD-10-CM

## 2012-09-24 DIAGNOSIS — Z23 Encounter for immunization: Secondary | ICD-10-CM

## 2012-09-24 DIAGNOSIS — I10 Essential (primary) hypertension: Secondary | ICD-10-CM

## 2012-09-24 NOTE — Assessment & Plan Note (Signed)
Mild and asymptomatic Watching at this time Cbc today and adv

## 2012-09-24 NOTE — Assessment & Plan Note (Signed)
Scheduled annual screening mammogram Nl breast exam today  Encouraged monthly self exams   

## 2012-09-24 NOTE — Assessment & Plan Note (Signed)
Due for 2 year dexa- will schedule

## 2012-09-24 NOTE — Assessment & Plan Note (Signed)
Very well controlled with caduet and diet Disc goals for lipids and reasons to control them Rev labs with pt Rev low sat fat diet in detail

## 2012-09-24 NOTE — Assessment & Plan Note (Signed)
bp is stable today  No cp or palpitations or headaches or edema  No side effects to medicines  BP Readings from Last 3 Encounters:  09/24/12 132/76  08/05/12 126/64  04/26/12 112/72

## 2012-09-24 NOTE — Assessment & Plan Note (Signed)
Scheduled dexa 2 year  No hx of fragility fractures  On ca and D D level today

## 2012-09-24 NOTE — Patient Instructions (Addendum)
Flu shot today  Labs today  You should get a pneumonia vaccine at a later date We will refer you for a mammogram and bone density test at check out  Keep taking care of yourself

## 2012-09-24 NOTE — Progress Notes (Signed)
Subjective:    Patient ID: Natasha [name], female    DOB: 09/13/1946, 66 y.o.   MRN: [id number]  HPI Here for check up of chronic medical conditions and to review health mt list   Is doing great  No new problems at all   Wt is down 2 lb with bmi of 35 Is watching her diet  Is exercising - likes to work outside and also walking    bp is stable today  No cp or palpitations or headaches or edema  No side effects to medicines  BP Readings from Last 3 Encounters:  09/24/12 132/76  08/05/12 126/64  04/26/12 112/72     Lipids Lab Results  Component Value Date   CHOL 154 04/26/2012   CHOL 166 04/14/2011   CHOL 176 03/14/2010   Lab Results  Component Value Date   HDL 48.00 04/26/2012   HDL 40.98 04/14/2011   HDL 55.40 03/14/2010   Lab Results  Component Value Date   LDLCALC 84 04/26/2012   LDLCALC 90 04/14/2011   LDLCALC 97 03/14/2010   Lab Results  Component Value Date   TRIG 112.0 04/26/2012   TRIG 135.0 04/14/2011   TRIG 116.0 03/14/2010   Lab Results  Component Value Date   CHOLHDL 3 04/26/2012   CHOLHDL 3 04/14/2011   CHOLHDL 3 03/14/2010   No results found for this basename: LDLDIRECT     Mild thrombocytopenia- no symptoms Lab Results  Component Value Date   WBC 3.2* 04/26/2012   HGB 14.3 04/26/2012   HCT 42.1 04/26/2012   MCV 88.9 04/26/2012   PLT 126.0* 04/26/2012   no bruising or bleeding   colonosc is up to date  Zoster status- is not interested in vaccine at this time   Pneumovax-plans to get later   Flu vaccine- has not had one -wants to get that   Pap 5/12 No abn paps No gyn problems    mammo 5/12- needs to set that up - [facility] Self exam no lumps or changes   Osteopenia  Last dexa 8/09 Needs to schedule that  Taking ca and D  No broken bones at all   Patient Active Problem List  Diagnosis  . HYPERCHOLESTEROLEMIA  . THROMBOCYTOPENIA  . BENIGN POSITIONAL VERTIGO  . HYPERTENSION  . CORONARY ARTERY DISEASE  . CONGESTIVE HEART FAILURE  . POSTMENOPAUSAL  STATUS  . DEGENERATIVE DISC DISEASE, LUMBAR SPINE  . OSTEOPENIA  . MAMMOGRAM, ABNORMAL, RIGHT  . Gynecologic exam normal  . Other screening mammogram  . Allergic rhinitis  . Headache around the eyes  . Bee sting reaction  . Post-menopausal   Past Medical History  Diagnosis Date  . CHF (congestive heart failure)   . CAD (coronary artery disease)     (4/05 carotid dopler- 39% stenosis) , (ECHO- mild LVH, EF 60%)  . Hypertension   . Hyperlipidemia   . Thrombocytopenia     mild  . Osteopenia   . DDD (degenerative disc disease), lumbar 11/2006    L5-S1 bulging disk   Past Surgical History  Procedure Date  . Cholecystectomy   . Tubal ligation   . Shoulder surgery 2004    fall  . Back surgery 2004    fall at work   History  Substance Use Topics  . Smoking status: Never Smoker   . Smokeless tobacco: Not on file  . Alcohol Use: No   Family History  Problem Relation Age of Onset  . Coronary artery disease Father   .  Lung cancer Sister   . Breast cancer Maternal Grandmother   . Diabetes Sister   . Other Sister     OP  . Other Mother     has pacemaker   Allergies  Allergen Reactions  . Naproxen Sodium     REACTION: stomach pain   Current Outpatient Prescriptions on File Prior to Visit  Medication Sig Dispense Refill  . amLODipine-atorvastatin (CADUET) 5-20 MG per tablet Take 1 tablet by mouth daily.  30 tablet  11  . aspirin 81 MG tablet Take 81 mg by mouth daily.        . calcitonin, salmon, (MIACALCIN) 200 UNIT/ACT nasal spray Place 1 spray into the nose daily. Alternate nostrils  3.7 mL  11  . pantoprazole (PROTONIX) 40 MG tablet Take 1 tablet (40 mg total) by mouth daily.  30 tablet  11  . telmisartan-hydrochlorothiazide (MICARDIS HCT) 80-12.5 MG per tablet Take 0.5 tablets by mouth daily.  15 tablet  11      Review of Systems Review of Systems  Constitutional: Negative for fever, appetite change, fatigue and unexpected weight change.  Eyes: Negative for  pain and visual disturbance.  Respiratory: Negative for cough and shortness of breath.   Cardiovascular: Negative for cp or palpitations    Gastrointestinal: Negative for nausea, diarrhea and constipation.  Genitourinary: Negative for urgency and frequency.  Skin: Negative for pallor or rash   Neurological: Negative for weakness, light-headedness, numbness and headaches.  Hematological: Negative for adenopathy. Does not bruise/bleed easily.  Psychiatric/Behavioral: Negative for dysphoric mood. The patient is not nervous/anxious.         Objective:   Physical Exam  Constitutional: She appears well-developed and well-nourished. No distress.       obese and well appearing   HENT:  Head: Normocephalic and atraumatic.  Right Ear: External ear normal.  Left Ear: External ear normal.  Nose: Nose normal.  Mouth/Throat: Oropharynx is clear and moist.  Eyes: Conjunctivae normal and EOM are normal. Pupils are equal, round, and reactive to light. Right eye exhibits no discharge. Left eye exhibits no discharge. No scleral icterus.  Neck: Normal range of motion. Neck supple. No JVD present. Carotid bruit is not present. No thyromegaly present.  Cardiovascular: Normal rate, regular rhythm, normal heart sounds and intact distal pulses.  Exam reveals no gallop.   Pulmonary/Chest: Effort normal and breath sounds normal. No respiratory distress. She has no wheezes.  Abdominal: Soft. Bowel sounds are normal. She exhibits no distension, no abdominal bruit and no mass. There is no tenderness.  Genitourinary: No breast swelling, tenderness, discharge or bleeding.       Breast exam: No mass, nodules, thickening, tenderness, bulging, retraction, inflamation, nipple discharge or skin changes noted.  No axillary or clavicular LA.  Chaperoned exam.    Musculoskeletal: Normal range of motion. She exhibits no edema and no tenderness.       No kyphosis  Lymphadenopathy:    She has no cervical adenopathy.    Neurological: She is alert. She has normal reflexes. No cranial nerve deficit. She exhibits normal muscle tone. Coordination normal.  Skin: Skin is warm and dry. No rash noted. No erythema. No pallor.  Psychiatric: She has a normal mood and affect.          Assessment & Plan:

## 2012-09-25 ENCOUNTER — Encounter: Payer: Self-pay | Admitting: *Deleted

## 2012-09-25 LAB — CBC WITH DIFFERENTIAL/PLATELET
Basophils Absolute: 0 10*3/uL (ref 0.0–0.1)
Basophils Relative: 0.3 % (ref 0.0–3.0)
Eosinophils Absolute: 0.1 10*3/uL (ref 0.0–0.7)
Eosinophils Relative: 2.4 % (ref 0.0–5.0)
HCT: 43.1 % (ref 36.0–46.0)
Hemoglobin: 14.1 g/dL (ref 12.0–15.0)
Lymphocytes Relative: 24.1 % (ref 12.0–46.0)
Lymphs Abs: 1.4 10*3/uL (ref 0.7–4.0)
MCHC: 32.8 g/dL (ref 30.0–36.0)
MCV: 89.6 fl (ref 78.0–100.0)
Monocytes Absolute: 0.3 10*3/uL (ref 0.1–1.0)
Monocytes Relative: 5.1 % (ref 3.0–12.0)
Neutro Abs: 3.9 10*3/uL (ref 1.4–7.7)
Neutrophils Relative %: 68.1 % (ref 43.0–77.0)
Platelets: 132 10*3/uL — ABNORMAL LOW (ref 150.0–400.0)
RBC: 4.8 Mil/uL (ref 3.87–5.11)
RDW: 13.3 % (ref 11.5–14.6)
WBC: 5.8 10*3/uL (ref 4.5–10.5)

## 2012-09-25 LAB — GLUCOSE, RANDOM: Glucose, Bld: 95 mg/dL (ref 70–99)

## 2012-09-25 LAB — VITAMIN D 25 HYDROXY (VIT D DEFICIENCY, FRACTURES): Vit D, 25-Hydroxy: 56 ng/mL (ref 30–89)

## 2012-11-10 LAB — HM DEXA SCAN

## 2012-11-14 ENCOUNTER — Encounter: Payer: Self-pay | Admitting: Family Medicine

## 2012-11-14 ENCOUNTER — Ambulatory Visit: Payer: Self-pay | Admitting: Family Medicine

## 2013-01-07 ENCOUNTER — Telehealth: Payer: Self-pay | Admitting: *Deleted

## 2013-01-07 MED ORDER — LOSARTAN POTASSIUM-HCTZ 100-12.5 MG PO TABS: 0.5000 | ORAL_TABLET | Freq: Every day | ORAL | Status: DC

## 2013-01-07 NOTE — Telephone Encounter (Signed)
That is fine - sorry I was confused- change it to that and give 6 mo of refils please, thanks

## 2013-01-07 NOTE — Telephone Encounter (Signed)
Received a fax from [facility] that [employer] will not cover Miacardis/Hct. Pharmacy request permission to change to losartan Hctz 100/12.5. Is this change ok? Needs new rx sent to [facility].

## 2013-01-07 NOTE — Telephone Encounter (Signed)
That is fine - px written to send electronically Rev her med list with her however- I do not see losartan hct on her list?

## 2013-01-07 NOTE — Telephone Encounter (Signed)
Pt's not on losartan/Hctz, they want permission to change it since insurance wont cover miacardis/ Hctz, is it ok to change an if so should pt take 1/2 tab qd like she is doing the Miacardis/hct, please advise

## 2013-01-10 ENCOUNTER — Ambulatory Visit (INDEPENDENT_AMBULATORY_CARE_PROVIDER_SITE_OTHER): Payer: Medicare Other | Admitting: Family Medicine

## 2013-01-10 ENCOUNTER — Encounter: Payer: Self-pay | Admitting: Family Medicine

## 2013-01-10 VITALS — BP 126/72 | HR 72 | Temp 98.4°F | Wt 204.0 lb

## 2013-01-10 DIAGNOSIS — J020 Streptococcal pharyngitis: Secondary | ICD-10-CM | POA: Insufficient documentation

## 2013-01-10 DIAGNOSIS — J029 Acute pharyngitis, unspecified: Secondary | ICD-10-CM

## 2013-01-10 LAB — POCT RAPID STREP A (OFFICE): Rapid Strep A Screen: POSITIVE — AB

## 2013-01-10 MED ORDER — AMOXICILLIN 875 MG PO TABS: 875.0000 mg | ORAL_TABLET | Freq: Two times a day (BID) | ORAL | Status: AC

## 2013-01-10 MED ORDER — BENZONATATE 200 MG PO CAPS: 200.0000 mg | ORAL_CAPSULE | Freq: Three times a day (TID) | ORAL | Status: AC | PRN

## 2013-01-10 MED ORDER — LIDOCAINE VISCOUS 2 % MT SOLN: 10.0000 mL | OROMUCOSAL | Status: DC | PRN

## 2013-01-10 NOTE — Patient Instructions (Signed)
Drink plenty of fluids, take tylenol as needed, and gargle with warm salt water for your throat.  Tessalon for cough, lidocaine for sore throat.  Start the amoxil today.  This should gradually improve.  Take care.  Let us know if you have other concerns.

## 2013-01-10 NOTE — Progress Notes (Signed)
Sx started 2 days ago.  ST and voice changes.  Most annoyed by the ST.  Cold foods help. Sleep is disrupted.  Is gargling with salt water.  No FCVD.  Some nausea.  No fevers, no temp >100.  No aches.  No rash.  Some facial and ear pain.  Cough noted, with throat irritation.   Meds, vitals, and allergies reviewed.   ROS: See HPI.  Otherwise, noncontributory.  GEN: nad, alert and oriented HEENT: mucous membranes moist, tm w/o erythema, nasal exam w/o erythema, clear discharge noted,  OP with cobblestoning, no exudates.  NECK: supple w/o LA CV: rrr.   PULM: ctab, no inc wob EXT: no edema SKIN: no acute rash  RST pos.

## 2013-01-10 NOTE — Assessment & Plan Note (Signed)
rst pos, nontoxic, tessalon for cough, lidocaine for ST, amoxil for 10 days.  F/u prn.  She agrees.

## 2013-05-19 ENCOUNTER — Other Ambulatory Visit: Payer: Self-pay | Admitting: Family Medicine

## 2013-05-23 ENCOUNTER — Other Ambulatory Visit: Payer: Self-pay | Admitting: Family Medicine

## 2013-05-23 MED ORDER — PANTOPRAZOLE SODIUM 40 MG PO TBEC: DELAYED_RELEASE_TABLET | ORAL | Status: DC

## 2013-05-23 NOTE — Telephone Encounter (Signed)
Pt requesting 90 day supply

## 2013-06-23 ENCOUNTER — Other Ambulatory Visit: Payer: Self-pay | Admitting: Family Medicine

## 2013-06-23 NOTE — Telephone Encounter (Signed)
Electronic refill request, no recent appt, no future appt, please advise

## 2013-06-23 NOTE — Telephone Encounter (Signed)
done

## 2013-06-23 NOTE — Telephone Encounter (Signed)
Please refillf or 6 mo , thanks 

## 2013-07-08 ENCOUNTER — Other Ambulatory Visit: Payer: Self-pay | Admitting: *Deleted

## 2013-07-08 MED ORDER — AMLODIPINE-ATORVASTATIN 5-20 MG PO TABS: 1.0000 | ORAL_TABLET | Freq: Every day | ORAL | Status: DC

## 2013-07-25 ENCOUNTER — Other Ambulatory Visit: Payer: Self-pay | Admitting: Family Medicine

## 2013-07-25 NOTE — Telephone Encounter (Signed)
Fax refill request, no recent/future appt., please advise  

## 2013-07-25 NOTE — Telephone Encounter (Signed)
Please schedule a PE for late fall and refill until then

## 2013-07-28 ENCOUNTER — Encounter: Payer: Self-pay | Admitting: *Deleted

## 2013-07-28 NOTE — Telephone Encounter (Signed)
Opened in error

## 2013-07-29 MED ORDER — LOSARTAN POTASSIUM-HCTZ 100-12.5 MG PO TABS: 0.5000 | ORAL_TABLET | Freq: Every day | ORAL | Status: DC

## 2013-07-29 NOTE — Telephone Encounter (Signed)
appt scheduled and meds refilled until then 

## 2013-10-22 ENCOUNTER — Telehealth: Payer: Self-pay | Admitting: Family Medicine

## 2013-10-22 DIAGNOSIS — E7800 Pure hypercholesterolemia, unspecified: Secondary | ICD-10-CM

## 2013-10-22 DIAGNOSIS — D696 Thrombocytopenia, unspecified: Secondary | ICD-10-CM

## 2013-10-22 DIAGNOSIS — I10 Essential (primary) hypertension: Secondary | ICD-10-CM

## 2013-10-22 DIAGNOSIS — M899 Disorder of bone, unspecified: Secondary | ICD-10-CM

## 2013-10-22 NOTE — Telephone Encounter (Signed)
Message copied by Judy Pimple on Wed Oct 22, 2013 10:06 PM ------      Message from: Alvina Chou      Created: Tue Oct 14, 2013  4:43 PM      Regarding: Lab orders for Friday, 11.14.14       Patient is scheduled for CPX labs, please order future labs, Thanks , Terri       ------

## 2013-10-24 ENCOUNTER — Other Ambulatory Visit (INDEPENDENT_AMBULATORY_CARE_PROVIDER_SITE_OTHER): Payer: Medicare Other

## 2013-10-24 DIAGNOSIS — D696 Thrombocytopenia, unspecified: Secondary | ICD-10-CM

## 2013-10-24 DIAGNOSIS — M899 Disorder of bone, unspecified: Secondary | ICD-10-CM

## 2013-10-24 DIAGNOSIS — E7800 Pure hypercholesterolemia, unspecified: Secondary | ICD-10-CM

## 2013-10-24 DIAGNOSIS — I10 Essential (primary) hypertension: Secondary | ICD-10-CM

## 2013-10-24 LAB — TSH: TSH: 2.09 u[IU]/mL (ref 0.35–5.50)

## 2013-10-24 LAB — COMPREHENSIVE METABOLIC PANEL
ALT: 16 U/L (ref 0–35)
AST: 21 U/L (ref 0–37)
Albumin: 4.3 g/dL (ref 3.5–5.2)
Alkaline Phosphatase: 80 U/L (ref 39–117)
BUN: 16 mg/dL (ref 6–23)
CO2: 29 mEq/L (ref 19–32)
Calcium: 9.9 mg/dL (ref 8.4–10.5)
Chloride: 106 mEq/L (ref 96–112)
Creatinine, Ser: 1 mg/dL (ref 0.4–1.2)
GFR: 62.22 mL/min (ref >60.00)
Glucose, Bld: 108 mg/dL — ABNORMAL HIGH (ref 70–99)
Potassium: 3.8 mEq/L (ref 3.5–5.1)
Sodium: 141 mEq/L (ref 135–145)
Total Bilirubin: 1 mg/dL (ref 0.3–1.2)
Total Protein: 7 g/dL (ref 6.0–8.3)

## 2013-10-24 LAB — LIPID PANEL
Cholesterol: 158 mg/dL (ref 0–200)
HDL: 47.1 mg/dL (ref >39.00)
LDL Cholesterol: 81 mg/dL (ref 0–99)
Total CHOL/HDL Ratio: 3
Triglycerides: 150 mg/dL — ABNORMAL HIGH (ref 0.0–149.0)
VLDL: 30 mg/dL (ref 0.0–40.0)

## 2013-10-24 LAB — CBC WITH DIFFERENTIAL/PLATELET
Basophils Absolute: 0 10*3/uL (ref 0.0–0.1)
Basophils Relative: 1 % (ref 0.0–3.0)
Eosinophils Absolute: 0.1 10*3/uL (ref 0.0–0.7)
Eosinophils Relative: 3.7 % (ref 0.0–5.0)
HCT: 41.7 % (ref 36.0–46.0)
Hemoglobin: 14.3 g/dL (ref 12.0–15.0)
Lymphocytes Relative: 29.1 % (ref 12.0–46.0)
Lymphs Abs: 1.2 10*3/uL (ref 0.7–4.0)
MCHC: 34.3 g/dL (ref 30.0–36.0)
MCV: 85.9 fl (ref 78.0–100.0)
Monocytes Absolute: 0.2 10*3/uL (ref 0.1–1.0)
Monocytes Relative: 6.3 % (ref 3.0–12.0)
Neutro Abs: 2.4 10*3/uL (ref 1.4–7.7)
Neutrophils Relative %: 59.9 % (ref 43.0–77.0)
Platelets: 139 10*3/uL — ABNORMAL LOW (ref 150.0–400.0)
RBC: 4.85 Mil/uL (ref 3.87–5.11)
RDW: 13.1 % (ref 11.5–14.6)
WBC: 4 10*3/uL — ABNORMAL LOW (ref 4.5–10.5)

## 2013-10-25 LAB — VITAMIN D 25 HYDROXY (VIT D DEFICIENCY, FRACTURES): Vit D, 25-Hydroxy: 56 ng/mL (ref 30–89)

## 2013-10-28 ENCOUNTER — Ambulatory Visit (INDEPENDENT_AMBULATORY_CARE_PROVIDER_SITE_OTHER): Payer: Medicare Other | Admitting: Family Medicine

## 2013-10-28 ENCOUNTER — Encounter: Payer: Self-pay | Admitting: Family Medicine

## 2013-10-28 VITALS — BP 132/70 | HR 64 | Temp 98.4°F | Ht 65.0 in | Wt 205.5 lb

## 2013-10-28 DIAGNOSIS — Z0000 Encounter for general adult medical examination without abnormal findings: Secondary | ICD-10-CM | POA: Insufficient documentation

## 2013-10-28 DIAGNOSIS — E6609 Other obesity due to excess calories: Secondary | ICD-10-CM | POA: Insufficient documentation

## 2013-10-28 DIAGNOSIS — E66812 Obesity, class 2: Secondary | ICD-10-CM | POA: Insufficient documentation

## 2013-10-28 DIAGNOSIS — R7309 Other abnormal glucose: Secondary | ICD-10-CM

## 2013-10-28 DIAGNOSIS — E7800 Pure hypercholesterolemia, unspecified: Secondary | ICD-10-CM

## 2013-10-28 DIAGNOSIS — D696 Thrombocytopenia, unspecified: Secondary | ICD-10-CM

## 2013-10-28 DIAGNOSIS — I10 Essential (primary) hypertension: Secondary | ICD-10-CM

## 2013-10-28 DIAGNOSIS — E669 Obesity, unspecified: Secondary | ICD-10-CM | POA: Insufficient documentation

## 2013-10-28 DIAGNOSIS — R7303 Prediabetes: Secondary | ICD-10-CM | POA: Insufficient documentation

## 2013-10-28 DIAGNOSIS — Z23 Encounter for immunization: Secondary | ICD-10-CM

## 2013-10-28 DIAGNOSIS — M899 Disorder of bone, unspecified: Secondary | ICD-10-CM

## 2013-10-28 DIAGNOSIS — R739 Hyperglycemia, unspecified: Secondary | ICD-10-CM

## 2013-10-28 MED ORDER — PANTOPRAZOLE SODIUM 40 MG PO TBEC: DELAYED_RELEASE_TABLET | ORAL | Status: DC

## 2013-10-28 MED ORDER — LOSARTAN POTASSIUM-HCTZ 100-12.5 MG PO TABS: 0.5000 | ORAL_TABLET | Freq: Every day | ORAL | Status: DC

## 2013-10-28 MED ORDER — AMLODIPINE-ATORVASTATIN 5-20 MG PO TABS: 1.0000 | ORAL_TABLET | Freq: Every day | ORAL | Status: DC

## 2013-10-28 MED ORDER — CALCITONIN (SALMON) 200 UNIT/ACT NA SOLN: 1.0000 | Freq: Every day | NASAL | Status: DC

## 2013-10-28 NOTE — Assessment & Plan Note (Signed)
Fasting gluc 108 At risk for DM Disc wt loss strategy/low glycemic diet and exercise  Will continue to follow

## 2013-10-28 NOTE — Progress Notes (Signed)
Subjective:    Patient ID: Natasha [name], female    DOB: 1946/08/11, 67 y.o.   MRN: [id number]  HPI I have personally reviewed the Medicare Annual Wellness questionnaire and have noted 1. The patient's medical and social history 2. Their use of alcohol, tobacco or illicit drugs 3. Their current medications and supplements 4. The patient's functional ability including ADL's, fall risks, home safety risks and hearing or visual             impairment. 5. Diet and physical activities 6. Evidence for depression or mood disorders  The patients weight, height, BMI have been recorded in the chart and visual acuity is per eye clinic.  I have made referrals, counseling and provided education to the patient based review of the above and I have provided the pt with a written personalized care plan for preventive services.  Doing well overall   See scanned forms.  Routine anticipatory guidance given to patient.  See health maintenance. Flu vaccine today  Shingles vaccine -not interested in one  PNA- will get vaccine today Tetanus 1/04 vaccine  Colon 10/05 normal -- 10 year recall  Breast cancer screening 12/13 -[place] - was nl and she will schedule her own  Self exam no lumps or changes  Gyn exam 2012-no problems at all  Advance directive- has a living will and POA  Cognitive function addressed- see scanned forms- and if abnormal then additional documentation follows. -no problems at all   Wt is stable with bmi of 34  Doing ok with healthy diet  Just got a treadmill - she walks on that - just got started using that   PMH and SH reviewed  Meds, vitals, and allergies reviewed.   ROS: See HPI.  Otherwise negative.      Chemistry      Component Value Date/Time   NA 141 10/24/2013 0921   NA 142 07/08/2009 0822   K 3.8 10/24/2013 0921   K 4.4 07/08/2009 0822   CL 106 10/24/2013 0921   CL 104 07/08/2009 0822   CO2 29 10/24/2013 0921   CO2 31 07/08/2009 0822   BUN 16 10/24/2013 0921   BUN 16 07/08/2009 0822   CREATININE 1.0 10/24/2013 0921   CREATININE 0.9 07/08/2009 0822      Component Value Date/Time   CALCIUM 9.9 10/24/2013 0921   CALCIUM 9.5 07/08/2009 0822   ALKPHOS 80 10/24/2013 0921   ALKPHOS 69 07/08/2009 0822   AST 21 10/24/2013 0921   AST 26 07/08/2009 0822   ALT 16 10/24/2013 0921   ALT 21 07/08/2009 0822   BILITOT 1.0 10/24/2013 0921   BILITOT 0.80 07/08/2009 0822    Glucose 108 - she loves sweets and breads    Lab Results  Component Value Date   WBC 4.0* 10/24/2013   HGB 14.3 10/24/2013   HCT 41.7 10/24/2013   MCV 85.9 10/24/2013   PLT 139.0* 10/24/2013  hx of thrombocytopenia - this is stable     Lab Results  Component Value Date   TSH 2.09 10/24/2013   Lab Results  Component Value Date   CHOL 158 10/24/2013   CHOL 154 04/26/2012   CHOL 166 04/14/2011   Lab Results  Component Value Date   HDL 47.10 10/24/2013   HDL 48.00 04/26/2012   HDL 40.98 04/14/2011   Lab Results  Component Value Date   LDLCALC 81 10/24/2013   LDLCALC 84 04/26/2012   LDLCALC 90 04/14/2011   Lab Results  Component Value  Date   TRIG 150.0* 10/24/2013   TRIG 112.0 04/26/2012   TRIG 135.0 04/14/2011   Lab Results  Component Value Date   CHOLHDL 3 10/24/2013   CHOLHDL 3 04/26/2012   CHOLHDL 3 04/14/2011   No results found for this basename: LDLDIRECT   with inc trig - ? hyperglyemia   D level in the 50s  No fx recently  dexa in 12/13 --openia in spine only   bp is stable today  No cp or palpitations or headaches or edema  No side effects to medicines  BP Readings from Last 3 Encounters:  10/28/13 132/70  01/10/13 126/72  09/24/12 132/76     No new heart issues   Patient Active Problem List   Diagnosis Date Noted  . Encounter for Medicare annual wellness exam 10/28/2013  . Post-menopausal 09/24/2012  . Bee sting reaction 08/05/2012  . Allergic rhinitis 04/26/2012  . Headache around the eyes 04/26/2012  . Gynecologic exam normal 04/19/2011  . Other  screening mammogram 04/19/2011  . MAMMOGRAM, ABNORMAL, RIGHT 03/31/2010  . OSTEOPENIA 11/10/2008  . THROMBOCYTOPENIA 08/11/2008  . POSTMENOPAUSAL STATUS 07/09/2008  . HYPERCHOLESTEROLEMIA 07/16/2007  . BENIGN POSITIONAL VERTIGO 07/16/2007  . HYPERTENSION 07/16/2007  . CORONARY ARTERY DISEASE 07/16/2007  . CONGESTIVE HEART FAILURE 07/16/2007  . 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 CHF (congestive heart failure)   . CAD (coronary artery disease)     (4/05 carotid dopler- 39% stenosis) , (ECHO- mild LVH, EF 60%)  . Hypertension   . Hyperlipidemia   . Thrombocytopenia     mild  . Osteopenia   . DDD (degenerative disc disease), lumbar 11/2006    L5-S1 bulging disk   Past Surgical History  Procedure Laterality Date  . Cholecystectomy    . Tubal ligation    . Shoulder surgery  2004    fall  . Back surgery  2004    fall at work   History  Substance Use Topics  . Smoking status: Never Smoker   . Smokeless tobacco: Not on file  . Alcohol Use: No   Family History  Problem Relation Age of Onset  . Coronary artery disease Father   . Lung cancer Sister   . Breast cancer Maternal Grandmother   . Diabetes Sister   . Other Sister     OP  . Other Mother     has pacemaker   Allergies  Allergen Reactions  . Naproxen Sodium     REACTION: stomach pain   Current Outpatient Prescriptions on File Prior to Visit  Medication Sig Dispense Refill  . amLODipine-atorvastatin (CADUET) 5-20 MG per tablet Take 1 tablet by mouth daily. * Needs appointment for fasting labs, then to see Dr 1-2 weeks later for additional refills*  30 tablet  3  . aspirin 81 MG tablet Take 81 mg by mouth daily.        . calcitonin, salmon, (MIACALCIN/FORTICAL) 200 UNIT/ACT nasal spray INSTILL ONE SPRAY INTO ONE NOSTRIL DAILY, ALTERNATING NOSTRILS EACH DAY  3.7 mL  5  . Cholecalciferol (VITAMIN D-3) 1000 UNITS CAPS Take 1 tablet by mouth daily.      Marland Kitchen  losartan-hydrochlorothiazide (HYZAAR) 100-12.5 MG per tablet Take 0.5 tablets by mouth daily.  45 tablet  0  . pantoprazole (PROTONIX) 40 MG tablet TAKE 1 TABLET EVERY DAY  90 tablet  0   No current facility-administered medications on file prior to visit.    Review  of Systems Review of Systems  Constitutional: Negative for fever, appetite change, fatigue and unexpected weight change.  Eyes: Negative for pain and visual disturbance.  Respiratory: Negative for cough and shortness of breath.   Cardiovascular: Negative for cp or palpitations    Gastrointestinal: Negative for nausea, diarrhea and constipation.  Genitourinary: Negative for urgency and frequency.  Skin: Negative for pallor or rash   Neurological: Negative for weakness, light-headedness, numbness and headaches.  Hematological: Negative for adenopathy. Does not bruise/bleed easily.  Psychiatric/Behavioral: Negative for dysphoric mood. The patient is not nervous/anxious.         Objective:   Physical Exam  Nursing note and vitals reviewed. Constitutional: She appears well-developed and well-nourished. No distress.  obese and well appearing   HENT:  Head: Normocephalic and atraumatic.  Right Ear: External ear normal.  Left Ear: External ear normal.  Mouth/Throat: Oropharynx is clear and moist.  Eyes: Conjunctivae and EOM are normal. Pupils are equal, round, and reactive to light. No scleral icterus.  Neck: Normal range of motion. Neck supple. No JVD present. Carotid bruit is not present. No thyromegaly present.  Cardiovascular: Normal rate, regular rhythm, normal heart sounds and intact distal pulses.  Exam reveals no gallop.   Pulmonary/Chest: Effort normal and breath sounds normal. No respiratory distress. She has no wheezes. She exhibits no tenderness.  Abdominal: Soft. Bowel sounds are normal. She exhibits no distension, no abdominal bruit and no mass. There is no tenderness.  Genitourinary: No breast swelling,  tenderness, discharge or bleeding.  Breast exam: No mass, nodules, thickening, tenderness, bulging, retraction, inflamation, nipple discharge or skin changes noted.  No axillary or clavicular LA.     Musculoskeletal: Normal range of motion. She exhibits no edema and no tenderness.  Lymphadenopathy:    She has no cervical adenopathy.  Neurological: She is alert. She has normal reflexes. No cranial nerve deficit. She exhibits normal muscle tone. Coordination normal.  Skin: Skin is warm and dry. No rash noted. No erythema. No pallor.  Some SK  Psychiatric: She has a normal mood and affect.          Assessment & Plan:

## 2013-10-28 NOTE — Assessment & Plan Note (Signed)
Reviewed health habits including diet and exercise and skin cancer prevention Also reviewed health mt list, fam hx and immunizations  See HPI Wellness lab rev Pt will schedule own mammo Declines zoster vac Flu and pneumovax today

## 2013-10-28 NOTE — Assessment & Plan Note (Signed)
Discussed how this problem influences overall health and the risks it imposes  Reviewed plan for weight loss with lower calorie diet (via better food choices and also portion control or program like weight watchers) and exercise building up to or more than 30 minutes 5 days per week including some aerobic activity    

## 2013-10-28 NOTE — Assessment & Plan Note (Signed)
Disc goals for lipids and reasons to control them Rev labs with pt Rev low sat fat diet in detail Trig up from glycemia poss  Will continue to follow caduet refilled

## 2013-10-28 NOTE — Assessment & Plan Note (Signed)
Stable/mild  No symptoms  Rev lab today Lab Results  Component Value Date   WBC 4.0* 10/24/2013   HGB 14.3 10/24/2013   HCT 41.7 10/24/2013   MCV 85.9 10/24/2013   PLT 139.0* 10/24/2013

## 2013-10-28 NOTE — Progress Notes (Signed)
Pre-visit discussion using our clinic review tool. No additional management support is needed unless otherwise documented below in the visit note.  

## 2013-10-28 NOTE — Patient Instructions (Signed)
Pneumonia and flu vaccine today  Call the health dept about getting a tetanus shot - since medicare does not pay for it  Don't forget to schedule your yearly mammogram at [facility] for [facility]  Try to decrease sugars and starches in diet - also try to exercise 5 days per week for weight loss

## 2013-10-28 NOTE — Assessment & Plan Note (Signed)
Last dexa 12/13 ostopenia spine No falls or fx On miacalcin ns  Disc need for calcium/ vitamin D/ wt bearing exercise and bone density test every 2 y to monitor Disc safety/ fracture risk in detail

## 2013-10-28 NOTE — Assessment & Plan Note (Signed)
BP: 132/70 mmHg   bp in fair control at this time  No changes needed  Disc lifstyle change with low sodium diet and exercise   meds refilled Lab rev Wt loss adv

## 2013-11-17 ENCOUNTER — Encounter: Payer: Self-pay | Admitting: Family Medicine

## 2013-11-17 ENCOUNTER — Ambulatory Visit: Payer: Self-pay | Admitting: Family Medicine

## 2013-11-18 ENCOUNTER — Encounter: Payer: Self-pay | Admitting: *Deleted

## 2014-04-26 IMAGING — MG MM DIGITAL SCREENING BILAT W/ CAD
1 series · 5 of 5 positions shown · non-contrast
Comparison: Previous exam(s).

CLINICAL DATA: Screening.

EXAM:
DIGITAL SCREENING BILATERAL MAMMOGRAM WITH CAD

[R CC · right · 5 of 5 slices shown]
[im 1/5]
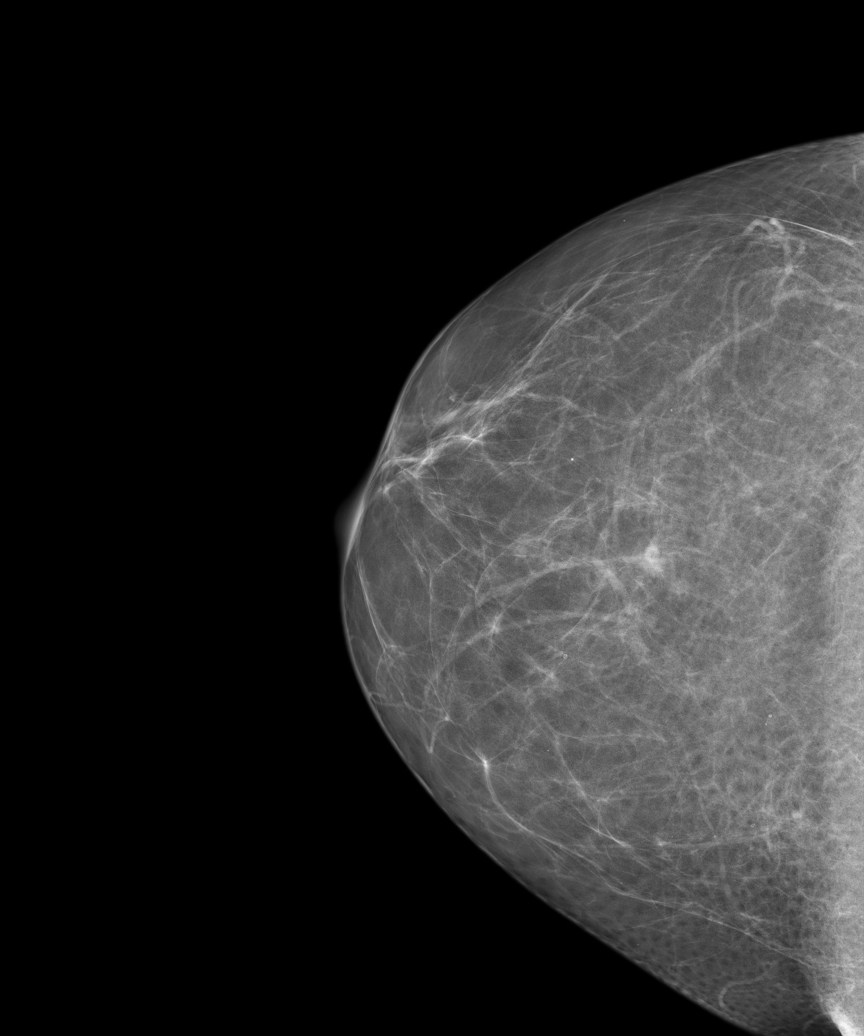
[im 2/5]
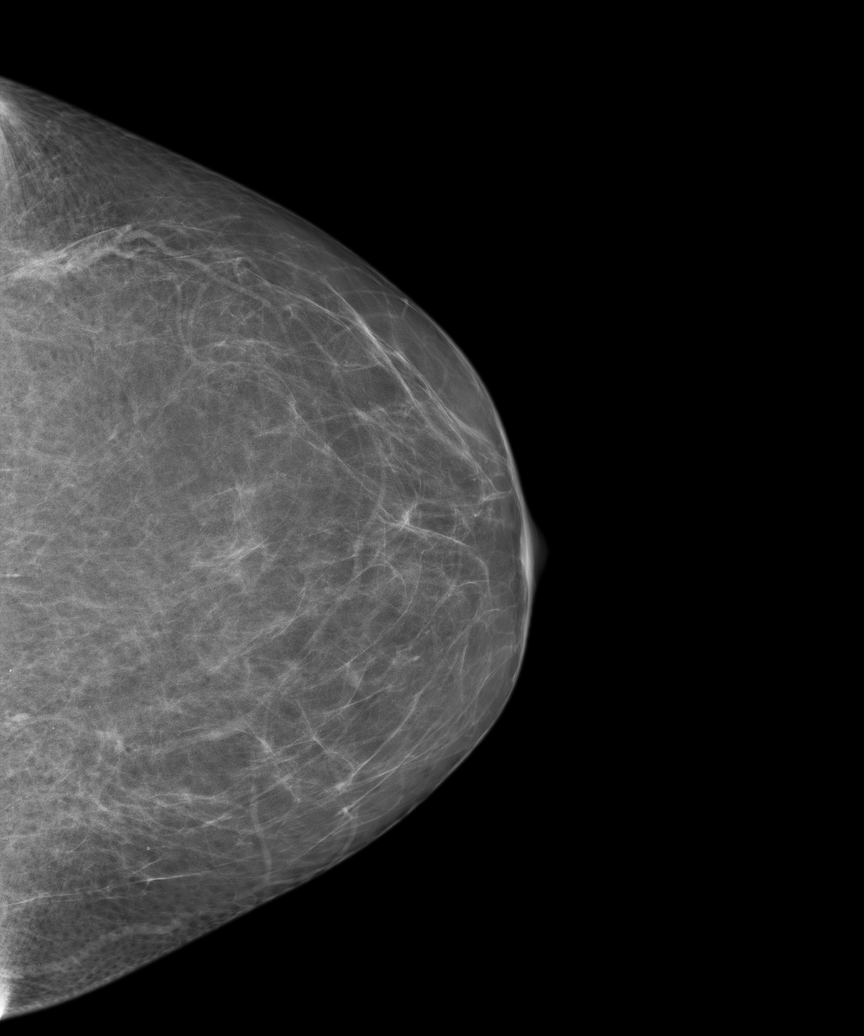
[im 3/5]
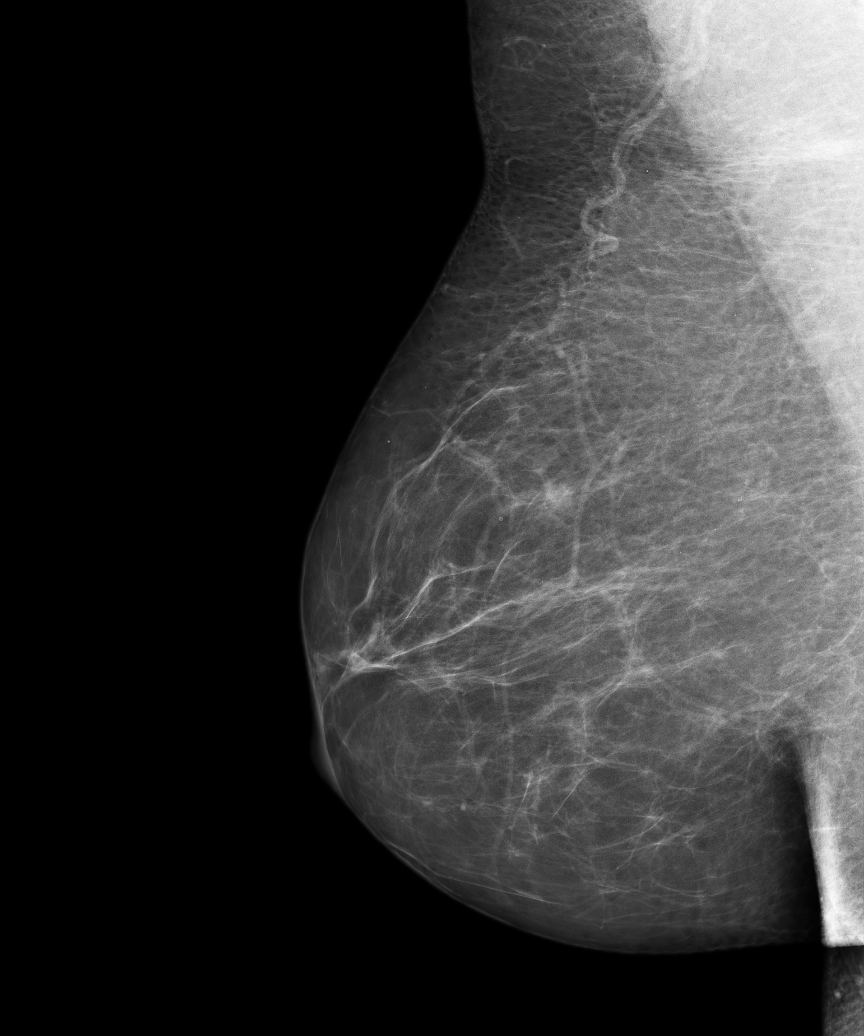
[im 4/5]
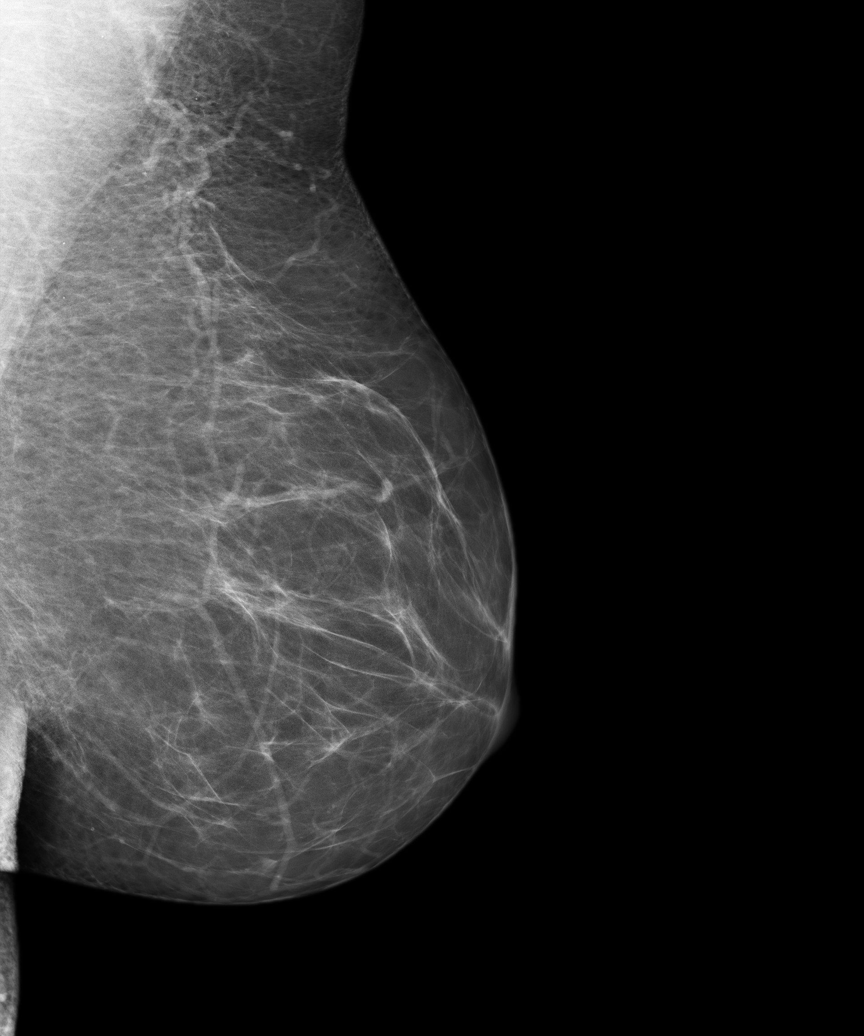
[im 5/5]
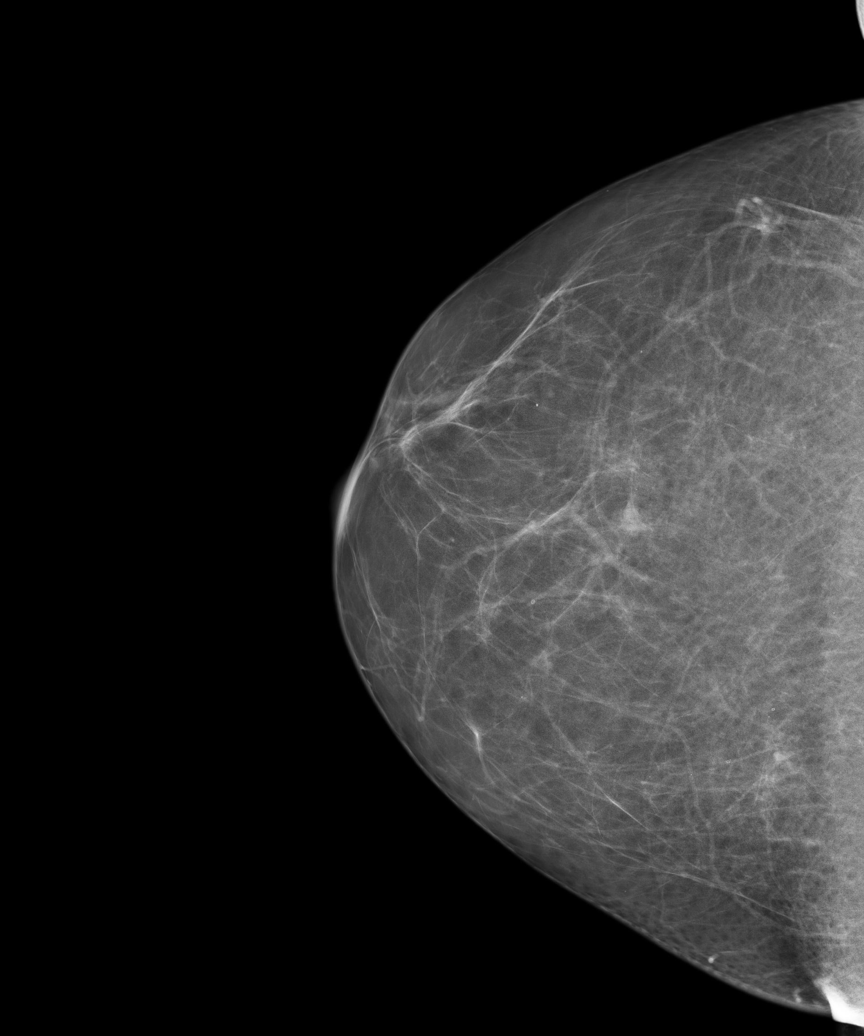

[5 of 5 positions shown; findings below may reference images not displayed]

ACR Breast Density Category b: There are scattered areas of
fibroglandular density.
FINDINGS: There are no findings suspicious for malignancy. Images were
processed with CAD.
IMPRESSION: No mammographic evidence of malignancy. A result letter of this
screening mammogram will be mailed directly to the patient.

RECOMMENDATION:
Screening mammogram in one year. (Code:GW-[id number])

BI-RADS CATEGORY  1: Negative

## 2014-05-15 ENCOUNTER — Encounter: Payer: Self-pay | Admitting: Family Medicine

## 2014-05-15 ENCOUNTER — Ambulatory Visit (INDEPENDENT_AMBULATORY_CARE_PROVIDER_SITE_OTHER): Payer: Medicare Other | Admitting: Family Medicine

## 2014-05-15 ENCOUNTER — Encounter (INDEPENDENT_AMBULATORY_CARE_PROVIDER_SITE_OTHER): Payer: Self-pay

## 2014-05-15 VITALS — BP 124/72 | HR 72 | Temp 98.2°F | Wt 203.5 lb

## 2014-05-15 DIAGNOSIS — J209 Acute bronchitis, unspecified: Secondary | ICD-10-CM

## 2014-05-15 MED ORDER — GUAIFENESIN-CODEINE 100-10 MG/5ML PO SYRP
5.0000 mL | ORAL_SOLUTION | Freq: Four times a day (QID) | ORAL | Status: DC | PRN
Start: 2014-05-15 — End: 2014-06-16

## 2014-05-15 MED ORDER — AZITHROMYCIN 250 MG PO TABS: ORAL_TABLET | ORAL | Status: DC

## 2014-05-15 NOTE — Progress Notes (Signed)
Subjective:    Patient ID: Natasha [name], female    DOB: 12-Dec-1945, 68 y.o.   MRN: [id number]  HPI Here for uri symptoms  Started Sunday night -now getting worse instead of better  Chills fever- as high as 100.0 Sneeze/cough/cong-non prod cough  Throat is not sore  Headache around eyes and top of head   No rash or tick bites  No sick exp at all known   No otc meds    Patient Active Problem List   Diagnosis Date Noted  . Encounter for Medicare annual wellness exam 10/28/2013  . Hyperglycemia 10/28/2013  . Obesity 10/28/2013  . Post-menopausal 09/24/2012  . Bee sting reaction 08/05/2012  . Allergic rhinitis 04/26/2012  . Headache around the eyes 04/26/2012  . Gynecologic exam normal 04/19/2011  . OSTEOPENIA 11/10/2008  . THROMBOCYTOPENIA 08/11/2008  . POSTMENOPAUSAL STATUS 07/09/2008  . HYPERCHOLESTEROLEMIA 07/16/2007  . HYPERTENSION 07/16/2007  . CORONARY ARTERY DISEASE 07/16/2007  . CONGESTIVE HEART FAILURE 07/16/2007  . 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 CHF (congestive heart failure)   . CAD (coronary artery disease)     (4/05 carotid dopler- 39% stenosis) , (ECHO- mild LVH, EF 60%)  . Hypertension   . Hyperlipidemia   . Thrombocytopenia     mild  . Osteopenia   . DDD (degenerative disc disease), lumbar 11/2006    L5-S1 bulging disk   Past Surgical History  Procedure Laterality Date  . Cholecystectomy    . Tubal ligation    . Shoulder surgery  2004    fall  . Back surgery  2004    fall at work   History  Substance Use Topics  . Smoking status: Never Smoker   . Smokeless tobacco: Not on file  . Alcohol Use: No   Family History  Problem Relation Age of Onset  . Coronary artery disease Father   . Lung cancer Sister   . Breast cancer Maternal Grandmother   . Diabetes Sister   . Other Sister     OP  . Other Mother     has pacemaker   Allergies  Allergen Reactions  . Naproxen Sodium       REACTION: stomach pain   Current Outpatient Prescriptions on File Prior to Visit  Medication Sig Dispense Refill  . amLODipine-atorvastatin (CADUET) 5-20 MG per tablet Take 1 tablet by mouth daily.  90 tablet  3  . aspirin 81 MG tablet Take 81 mg by mouth daily.        . calcitonin, salmon, (MIACALCIN/FORTICAL) 200 UNIT/ACT nasal spray Place 1 spray into alternate nostrils daily.  3.7 mL  11  . Cholecalciferol (VITAMIN D-3) 1000 UNITS CAPS Take 1 tablet by mouth daily.      Marland Kitchen losartan-hydrochlorothiazide (HYZAAR) 100-12.5 MG per tablet Take 0.5 tablets by mouth daily.  45 tablet  3  . pantoprazole (PROTONIX) 40 MG tablet TAKE 1 TABLET EVERY DAY  90 tablet  3   No current facility-administered medications on file prior to visit.    Review of Systems    Review of Systems  Constitutional: Negative for fever, appetite change, and unexpected weight change.  Eyes: Negative for pain and visual disturbance.  ENT pos for cong/ rhinorrhea / neg for sinus pain  Respiratory: Negative for shortness of breath.   Cardiovascular: Negative for cp or palpitations    Gastrointestinal: Negative for nausea, diarrhea and constipation.  Genitourinary: Negative for urgency and frequency.  Skin: Negative for pallor or rash   Neurological: Negative for weakness, light-headedness, numbness and headaches.  Hematological: Negative for adenopathy. Does not bruise/bleed easily.  Psychiatric/Behavioral: Negative for dysphoric mood. The patient is not nervous/anxious.      Objective:   Physical Exam  Constitutional: She appears well-developed and well-nourished. No distress.  obese and well appearing   HENT:  Head: Normocephalic and atraumatic.  Right Ear: External ear normal.  Left Ear: External ear normal.  Nares are injected and congested   No sinus tenderness  Eyes: Conjunctivae and EOM are normal. Pupils are equal, round, and reactive to light. Right eye exhibits no discharge. Left eye exhibits no  discharge. No scleral icterus.  Neck: Normal range of motion. Neck supple. No JVD present. Carotid bruit is not present. No thyromegaly present.  Cardiovascular: Normal rate and regular rhythm.   Pulmonary/Chest: Effort normal and breath sounds normal. No respiratory distress. She has no rales.  Harsh bs  Scattered rhonchi Good air exch Scant wheeze on forced exp only  Lymphadenopathy:    She has no cervical adenopathy.  Neurological: She is alert.  Skin: Skin is warm and dry. No rash noted. No erythema.  Psychiatric: She has a normal mood and affect.          Assessment & Plan:   Problem List Items Addressed This Visit     Respiratory   Acute bronchitis - Primary     Cover with zithromax Robitussin with codiene for cough with caution as directed  Disc symptomatic care - see instructions on AVS  Update if not starting to improve in a week or if worsening

## 2014-05-15 NOTE — Patient Instructions (Signed)
Rest and drink fluids Take zithromax for bronchitis  Robitussin with codiene for cough with caution of sedation Tylenol otc is fine for fever/ aches  Update if not starting to improve in a week or if worsening

## 2014-05-15 NOTE — Progress Notes (Signed)
Pre visit review using our clinic review tool, if applicable. No additional management support is needed unless otherwise documented below in the visit note. 

## 2014-05-17 NOTE — Assessment & Plan Note (Signed)
Cover with zithromax Robitussin with codiene for cough with caution as directed  Disc symptomatic care - see instructions on AVS  Update if not starting to improve in a week or if worsening

## 2014-06-16 ENCOUNTER — Encounter: Payer: Self-pay | Admitting: Family Medicine

## 2014-06-16 ENCOUNTER — Ambulatory Visit (INDEPENDENT_AMBULATORY_CARE_PROVIDER_SITE_OTHER): Payer: Medicare Other | Admitting: Family Medicine

## 2014-06-16 VITALS — BP 128/78 | HR 68 | Temp 98.0°F | Ht 65.0 in | Wt 205.2 lb

## 2014-06-16 DIAGNOSIS — R109 Unspecified abdominal pain: Secondary | ICD-10-CM

## 2014-06-16 LAB — POCT URINALYSIS DIPSTICK
Bilirubin, UA: NEGATIVE
Glucose, UA: NEGATIVE
Ketones, UA: NEGATIVE
Nitrite, UA: NEGATIVE
Spec Grav, UA: 1.015
Urobilinogen, UA: 0.2
pH, UA: 7

## 2014-06-16 NOTE — Progress Notes (Signed)
Subjective:    Patient ID: Natasha [name], female    DOB: [birth year], 68 y.o.   MRN: [id number]  HPI Here with side pain   Last Wednesday noticed a "catch" in her L side  Noticed it first when standing in a chair Is tender to the touch Dull/ constant not severe -and no worsening when she moves around No rash noted   She had lifted a lot the week before working on a flower bed   No GI symptoms , no change in bowel habits, no blood in stool  Takes protonix daily   Results for orders placed in visit on 06/16/14  POCT URINALYSIS DIPSTICK      Result Value Ref Range   Color, UA yellow     Clarity, UA hazy     Glucose, UA neg.     Bilirubin, UA neg.     Ketones, UA neg.     Spec Grav, UA 1.015     Blood, UA Trace     pH, UA 7.0     Protein, UA Trace     Urobilinogen, UA 0.2     Nitrite, UA neg.     Leukocytes, UA small (1+)       No urinary symptoms at all -no freq or urgency or blood or dysuria  No otc pain med   Patient Active Problem List   Diagnosis Date Noted  . Left sided abdominal pain 06/16/2014  . Left flank pain 06/16/2014  . Acute bronchitis 05/15/2014  . Encounter for Medicare annual wellness exam 10/28/2013  . Hyperglycemia 10/28/2013  . Obesity 10/28/2013  . Post-menopausal 09/24/2012  . Bee sting reaction 08/05/2012  . Allergic rhinitis 04/26/2012  . Headache around the eyes 04/26/2012  . Gynecologic exam normal 04/19/2011  . OSTEOPENIA 11/10/2008  . THROMBOCYTOPENIA 08/11/2008  . POSTMENOPAUSAL STATUS 07/09/2008  . HYPERCHOLESTEROLEMIA 07/16/2007  . HYPERTENSION 07/16/2007  . CORONARY ARTERY DISEASE 07/16/2007  . CONGESTIVE HEART FAILURE 07/16/2007  . 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 CHF (congestive heart failure)   . CAD (coronary artery disease)     (4/05 carotid dopler- 39% stenosis) , (ECHO- mild LVH, EF 60%)  . Hypertension   . Hyperlipidemia   . Thrombocytopenia     mild    . Osteopenia   . DDD (degenerative disc disease), lumbar 11/2006    L5-S1 bulging disk   Past Surgical History  Procedure Laterality Date  . Cholecystectomy    . Tubal ligation    . Shoulder surgery  2004    fall  . Back surgery  2004    fall at work   History  Substance Use Topics  . Smoking status: Never Smoker   . Smokeless tobacco: Not on file  . Alcohol Use: No   Family History  Problem Relation Age of Onset  . Coronary artery disease Father   . Lung cancer Sister   . Breast cancer Maternal Grandmother   . Diabetes Sister   . Other Sister     OP  . Other Mother     has pacemaker   Allergies  Allergen Reactions  . Naproxen Sodium     REACTION: stomach pain   Current Outpatient Prescriptions on File Prior to Visit  Medication Sig Dispense Refill  . amLODipine-atorvastatin (CADUET) 5-20 MG per tablet Take 1 tablet by mouth daily.  90 tablet  3  . aspirin 81 MG tablet Take 81  mg by mouth daily.        . calcitonin, salmon, (MIACALCIN/FORTICAL) 200 UNIT/ACT nasal spray Place 1 spray into alternate nostrils daily.  3.7 mL  11  . Cholecalciferol (VITAMIN D-3) 1000 UNITS CAPS Take 1 tablet by mouth daily.      Marland Kitchen losartan-hydrochlorothiazide (HYZAAR) 100-12.5 MG per tablet Take 0.5 tablets by mouth daily.  45 tablet  3  . pantoprazole (PROTONIX) 40 MG tablet TAKE 1 TABLET EVERY DAY  90 tablet  3   No current facility-administered medications on file prior to visit.    Review of Systems Review of Systems  Constitutional: Negative for fever, appetite change, fatigue and unexpected weight change.  Eyes: Negative for pain and visual disturbance.  Respiratory: Negative for cough and shortness of breath.   Cardiovascular: Negative for cp or palpitations    Gastrointestinal: Negative for  diarrhea and constipation. neg for blood in stool/ dark stool and pos for rare nausea w/o vomiting  Genitourinary: Negative for urgency and frequency. neg for hematuria or dysuria  Skin:  Negative for pallor or rash   MSK neg for back pain  Neurological: Negative for weakness, light-headedness, numbness and headaches.  Hematological: Negative for adenopathy. Does not bruise/bleed easily.  Psychiatric/Behavioral: Negative for dysphoric mood. The patient is not nervous/anxious.         Objective:   Physical Exam  Constitutional: She appears well-developed and well-nourished. No distress.  obese and well appearing   HENT:  Head: Normocephalic and atraumatic.  Eyes: Conjunctivae and EOM are normal. Pupils are equal, round, and reactive to light. No scleral icterus.  Neck: Normal range of motion. Neck supple.  Cardiovascular: Normal rate and regular rhythm.   Pulmonary/Chest: Effort normal and breath sounds normal.  Abdominal: Soft. Bowel sounds are normal. She exhibits no distension, no abdominal bruit, no pulsatile midline mass and no mass. There is no hepatosplenomegaly. There is tenderness in the left upper quadrant and left lower quadrant. There is no rigidity, no rebound, no guarding, no CVA tenderness, no tenderness at McBurney's point and negative Murphy's sign.  Musculoskeletal: She exhibits no edema and no tenderness.  No TS or LS tenderness Nl rom spine and LEs   Lymphadenopathy:    She has no cervical adenopathy.  Neurological: She is alert. No cranial nerve deficit. She exhibits normal muscle tone. Coordination normal.  Skin: Skin is warm and dry. No rash noted. No erythema. No pallor.  Psychiatric: She has a normal mood and affect.          Assessment & Plan:   Problem List Items Addressed This Visit     Other   Left sided abdominal pain     Mildly tender L mid abd w/o assoc symptoms  Reassuring exam - suspect pt strained abd wall musculature after heavy lifting the weekend before (and she agrees symptoms have gradually improved ) Will cx urine and watch  rec warm compress If worse or no imp would consider imaging/ lab    Left flank pain     Now  in mid abd area  Pend urine cx  Suspect this is muscular from lifting-will watch for change     Relevant Orders      Urine culture    Other Visit Diagnoses   Flank pain    -  Primary    Relevant Orders       POCT urinalysis dipstick (Completed)

## 2014-06-16 NOTE — Assessment & Plan Note (Signed)
Mildly tender L mid abd w/o assoc symptoms  Reassuring exam - suspect pt strained abd wall musculature after heavy lifting the weekend before (and she agrees symptoms have gradually improved ) Will cx urine and watch  rec warm compress If worse or no imp would consider imaging/ lab

## 2014-06-16 NOTE — Progress Notes (Signed)
Pre visit review using our clinic review tool, if applicable. No additional management support is needed unless otherwise documented below in the visit note. 

## 2014-06-16 NOTE — Patient Instructions (Signed)
I think you likely strained an abdominal muscle  If no further improvement in the coming 1-2 weeks let me know (or if worse)- we may consider an imaging study If any new symptoms -please alert me  We will also send urine for a culture and update you  Use a warm compress if needed

## 2014-06-16 NOTE — Assessment & Plan Note (Signed)
Now in mid abd area  Pend urine cx  Suspect this is muscular from lifting-will watch for change

## 2014-06-18 LAB — URINE CULTURE: Colony Count: 15000

## 2014-07-11 ENCOUNTER — Emergency Department: Payer: Self-pay | Admitting: Emergency Medicine

## 2014-07-11 LAB — CBC
HCT: 45.8 % (ref 35.0–47.0)
HGB: 15.2 g/dL (ref 12.0–16.0)
MCH: 29.4 pg (ref 26.0–34.0)
MCHC: 33.1 g/dL (ref 32.0–36.0)
MCV: 89 fL (ref 80–100)
Platelet: 137 10*3/uL — ABNORMAL LOW (ref 150–440)
RBC: 5.16 10*6/uL (ref 3.80–5.20)
RDW: 13.3 % (ref 11.5–14.5)
WBC: 6 10*3/uL (ref 3.6–11.0)

## 2014-07-11 LAB — COMPREHENSIVE METABOLIC PANEL
Albumin: 4.1 g/dL (ref 3.4–5.0)
Alkaline Phosphatase: 94 U/L
Anion Gap: 6 — ABNORMAL LOW (ref 7–16)
BUN: 13 mg/dL (ref 7–18)
Bilirubin,Total: 0.8 mg/dL (ref 0.2–1.0)
Calcium, Total: 9.8 mg/dL (ref 8.5–10.1)
Chloride: 106 mmol/L (ref 98–107)
Co2: 28 mmol/L (ref 21–32)
Creatinine: 0.94 mg/dL (ref 0.60–1.30)
EGFR (African American): >60
EGFR (Non-African Amer.): >60
Glucose: 128 mg/dL — ABNORMAL HIGH (ref 65–99)
Osmolality: 281 (ref 275–301)
Potassium: 4.2 mmol/L (ref 3.5–5.1)
SGOT(AST): 31 U/L (ref 15–37)
SGPT (ALT): 24 U/L
Sodium: 140 mmol/L (ref 136–145)
Total Protein: 8 g/dL (ref 6.4–8.2)

## 2014-07-11 LAB — TROPONIN I
Troponin-I: <0.02 ng/mL
Troponin-I: <0.02 ng/mL

## 2014-07-18 ENCOUNTER — Ambulatory Visit: Payer: Self-pay | Admitting: Orthopedic Surgery

## 2014-10-14 ENCOUNTER — Ambulatory Visit (INDEPENDENT_AMBULATORY_CARE_PROVIDER_SITE_OTHER): Payer: Medicare Other

## 2014-10-14 DIAGNOSIS — Z23 Encounter for immunization: Secondary | ICD-10-CM

## 2014-11-04 ENCOUNTER — Other Ambulatory Visit: Payer: Self-pay | Admitting: Family Medicine

## 2014-11-06 ENCOUNTER — Encounter: Payer: Medicare Other | Admitting: Family Medicine

## 2014-11-11 ENCOUNTER — Other Ambulatory Visit: Payer: Self-pay | Admitting: Family Medicine

## 2014-11-17 ENCOUNTER — Encounter: Payer: Self-pay | Admitting: Family Medicine

## 2014-11-17 ENCOUNTER — Ambulatory Visit (INDEPENDENT_AMBULATORY_CARE_PROVIDER_SITE_OTHER): Payer: Medicare Other | Admitting: Family Medicine

## 2014-11-17 VITALS — BP 126/72 | HR 59 | Temp 98.3°F | Ht 64.5 in | Wt 208.0 lb

## 2014-11-17 DIAGNOSIS — Z23 Encounter for immunization: Secondary | ICD-10-CM

## 2014-11-17 DIAGNOSIS — E2839 Other primary ovarian failure: Secondary | ICD-10-CM | POA: Insufficient documentation

## 2014-11-17 DIAGNOSIS — I10 Essential (primary) hypertension: Secondary | ICD-10-CM

## 2014-11-17 DIAGNOSIS — E7800 Pure hypercholesterolemia, unspecified: Secondary | ICD-10-CM

## 2014-11-17 DIAGNOSIS — Z1211 Encounter for screening for malignant neoplasm of colon: Secondary | ICD-10-CM

## 2014-11-17 DIAGNOSIS — Z0000 Encounter for general adult medical examination without abnormal findings: Secondary | ICD-10-CM

## 2014-11-17 DIAGNOSIS — R739 Hyperglycemia, unspecified: Secondary | ICD-10-CM

## 2014-11-17 DIAGNOSIS — E669 Obesity, unspecified: Secondary | ICD-10-CM

## 2014-11-17 DIAGNOSIS — M859 Disorder of bone density and structure, unspecified: Secondary | ICD-10-CM

## 2014-11-17 DIAGNOSIS — M8580 Other specified disorders of bone density and structure, unspecified site: Secondary | ICD-10-CM

## 2014-11-17 LAB — COMPREHENSIVE METABOLIC PANEL
ALT: 15 U/L (ref 0–35)
AST: 18 U/L (ref 0–37)
Albumin: 4.2 g/dL (ref 3.5–5.2)
Alkaline Phosphatase: 79 U/L (ref 39–117)
BUN: 17 mg/dL (ref 6–23)
CO2: 27 mEq/L (ref 19–32)
Calcium: 9.6 mg/dL (ref 8.4–10.5)
Chloride: 105 mEq/L (ref 96–112)
Creatinine, Ser: 1 mg/dL (ref 0.4–1.2)
GFR: 60.55 mL/min (ref >60.00)
Glucose, Bld: 110 mg/dL — ABNORMAL HIGH (ref 70–99)
Potassium: 4.4 mEq/L (ref 3.5–5.1)
Sodium: 140 mEq/L (ref 135–145)
Total Bilirubin: 1.1 mg/dL (ref 0.2–1.2)
Total Protein: 6.8 g/dL (ref 6.0–8.3)

## 2014-11-17 LAB — CBC WITH DIFFERENTIAL/PLATELET
Basophils Absolute: 0 10*3/uL (ref 0.0–0.1)
Basophils Relative: 1.2 % (ref 0.0–3.0)
Eosinophils Absolute: 0.1 10*3/uL (ref 0.0–0.7)
Eosinophils Relative: 3.8 % (ref 0.0–5.0)
HCT: 41 % (ref 36.0–46.0)
Hemoglobin: 13.6 g/dL (ref 12.0–15.0)
Lymphocytes Relative: 25.7 % (ref 12.0–46.0)
Lymphs Abs: 0.9 10*3/uL (ref 0.7–4.0)
MCHC: 33.3 g/dL (ref 30.0–36.0)
MCV: 88.4 fl (ref 78.0–100.0)
Monocytes Absolute: 0.3 10*3/uL (ref 0.1–1.0)
Monocytes Relative: 7.2 % (ref 3.0–12.0)
Neutro Abs: 2.2 10*3/uL (ref 1.4–7.7)
Neutrophils Relative %: 62.1 % (ref 43.0–77.0)
Platelets: 124 10*3/uL — ABNORMAL LOW (ref 150.0–400.0)
RBC: 4.63 Mil/uL (ref 3.87–5.11)
RDW: 13.1 % (ref 11.5–15.5)
WBC: 3.6 10*3/uL — ABNORMAL LOW (ref 4.0–10.5)

## 2014-11-17 LAB — LIPID PANEL
Cholesterol: 148 mg/dL (ref 0–200)
HDL: 46.6 mg/dL (ref >39.00)
LDL Cholesterol: 76 mg/dL (ref 0–99)
NonHDL: 101.4
Total CHOL/HDL Ratio: 3
Triglycerides: 128 mg/dL (ref 0.0–149.0)
VLDL: 25.6 mg/dL (ref 0.0–40.0)

## 2014-11-17 LAB — TSH: TSH: 1.98 u[IU]/mL (ref 0.35–4.50)

## 2014-11-17 LAB — HEMOGLOBIN A1C: Hgb A1c MFr Bld: 5.8 % (ref 4.6–6.5)

## 2014-11-17 LAB — VITAMIN D 25 HYDROXY (VIT D DEFICIENCY, FRACTURES): VITD: 45.65 ng/mL (ref 30.00–100.00)

## 2014-11-17 MED ORDER — LOSARTAN POTASSIUM-HCTZ 100-12.5 MG PO TABS: 0.5000 | ORAL_TABLET | Freq: Every day | ORAL | Status: DC

## 2014-11-17 MED ORDER — PANTOPRAZOLE SODIUM 40 MG PO TBEC: 40.0000 mg | DELAYED_RELEASE_TABLET | Freq: Every day | ORAL | Status: DC

## 2014-11-17 MED ORDER — CALCITONIN (SALMON) 200 UNIT/ACT NA SOLN: 1.0000 | Freq: Every day | NASAL | Status: DC

## 2014-11-17 MED ORDER — AMLODIPINE-ATORVASTATIN 5-20 MG PO TABS: 1.0000 | ORAL_TABLET | Freq: Every day | ORAL | Status: DC

## 2014-11-17 NOTE — Assessment & Plan Note (Signed)
Disc goals for lipids and reasons to control them Rev labs with pt- last check On caduet  Lab today Rev low sat fat diet in detail

## 2014-11-17 NOTE — Progress Notes (Signed)
Pre visit review using our clinic review tool, if applicable. No additional management support is needed unless otherwise documented below in the visit note. 

## 2014-11-17 NOTE — Assessment & Plan Note (Signed)
bp in fair control at this time  BP Readings from Last 1 Encounters:  11/17/14 126/72   No changes needed Disc lifstyle change with low sodium diet and exercise  Labs today Enc wt loss

## 2014-11-17 NOTE — Patient Instructions (Signed)
Stop at check out for referral for bone density test  Schedule your own mammogram  prevnar vaccine today Please do the stool card for screening   Work on healthy diet and exercise

## 2014-11-17 NOTE — Progress Notes (Signed)
Subjective:    Patient ID: Natasha [name], female    DOB: 05-16-1946, 68 y.o.   MRN: [id number]  HPI Here for annual medicare wellness visit and to rev chronic and acute medical problems   I have personally reviewed the Medicare Annual Wellness questionnaire and have noted 1. The patient's medical and social history 2. Their use of alcohol, tobacco or illicit drugs 3. Their current medications and supplements 4. The patient's functional ability including ADL's, fall risks, home safety risks and hearing or visual             impairment. 5. Diet and physical activities 6. Evidence for depression or mood disorders  The patients weight, height, BMI have been recorded in the chart and visual acuity is per eye clinic.  I have made referrals, counseling and provided education to the patient based review of the above and I have provided the pt with a written personalized care plan for preventive services.  Had hip problem all summer- bursitis and "pinched nerve in back"- had epidural injections , also bone spurs and arthritis  Doing better now  Also a month of PT - does some at home also  Not surgical problem  No regular exercise at all other than that   See scanned forms.  Routine anticipatory guidance given to patient.  See health maintenance. Colon cancer screening 10/05 normal - declines colonoscopy now -will do ifob card  Breast cancer screening mammogram 12/14 - is due and needs to schedule at [place] breast exam= no lumps or changes  Flu vaccine 11/15 Tetanus vaccine 3/15 Pneumovax 11/14 - wants to get the prevnar  Zoster vaccine - insurance does not pay   Advance directive -has living will and POA  Cognitive function addressed- see scanned forms- and if abnormal then additional documentation follows. No worries about memory - doing well    PMH and SH reviewed  Meds, vitals, and allergies reviewed.   ROS: See HPI.  Otherwise negative.      Wt is up 3 lb with bmi  of 35- with a summer or inactivity  Feels like she can finally start some exercise - [facility]  Diet has been "pretty good"= less sugar intake    Pap 5/12 nl No gyn problems  No new partners    Osteopenia dexa 12/13 On miacalcin Due for a dexa-will schedule that  No fx recently   Last labs - a year ago  Due for cholesterol test/ blood sugar and chemistries    Patient Active Problem List   Diagnosis Date Noted  . Left sided abdominal pain 06/16/2014  . Left flank pain 06/16/2014  . Acute bronchitis 05/15/2014  . Encounter for Medicare annual wellness exam 10/28/2013  . Hyperglycemia 10/28/2013  . Obesity 10/28/2013  . Post-menopausal 09/24/2012  . Bee sting reaction 08/05/2012  . Allergic rhinitis 04/26/2012  . Headache around the eyes 04/26/2012  . Gynecologic exam normal 04/19/2011  . Osteopenia 11/10/2008  . THROMBOCYTOPENIA 08/11/2008  . POSTMENOPAUSAL STATUS 07/09/2008  . HYPERCHOLESTEROLEMIA 07/16/2007  . Essential hypertension 07/16/2007  . CORONARY ARTERY DISEASE 07/16/2007  . CONGESTIVE HEART FAILURE 07/16/2007  . 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 CHF (congestive heart failure)   . CAD (coronary artery disease)     (4/05 carotid dopler- 39% stenosis) , (ECHO- mild LVH, EF 60%)  . Hypertension   . Hyperlipidemia   . Thrombocytopenia  mild  . Osteopenia   . DDD (degenerative disc disease), lumbar 11/2006    L5-S1 bulging disk   Past Surgical History  Procedure Laterality Date  . Cholecystectomy    . Tubal ligation    . Shoulder surgery  2004    fall  . Back surgery  2004    fall at work   History  Substance Use Topics  . Smoking status: Never Smoker   . Smokeless tobacco: Not on file  . Alcohol Use: No   Family History  Problem Relation Age of Onset  . Coronary artery disease Father   . Lung cancer Sister   . Breast cancer Maternal Grandmother   . Diabetes  Sister   . Other Sister     OP  . Other Mother     has pacemaker   Allergies  Allergen Reactions  . Naproxen Sodium     REACTION: stomach pain   Current Outpatient Prescriptions on File Prior to Visit  Medication Sig Dispense Refill  . amLODipine-atorvastatin (CADUET) 5-20 MG per tablet TAKE 1 TABLET EVERY DAY 90 tablet 0  . aspirin 81 MG tablet Take 81 mg by mouth daily.      . calcitonin, salmon, (MIACALCIN/FORTICAL) 200 UNIT/ACT nasal spray Place 1 spray into alternate nostrils daily. 3.7 mL 11  . Cholecalciferol (VITAMIN D-3) 1000 UNITS CAPS Take 1 tablet by mouth daily.    Marland Kitchen losartan-hydrochlorothiazide (HYZAAR) 100-12.5 MG per tablet TAKE ONE-HALF TABLET DAILY 45 tablet 0  . pantoprazole (PROTONIX) 40 MG tablet TAKE 1 TABLET EVERY DAY 90 tablet 0   No current facility-administered medications on file prior to visit.    Review of Systems Review of Systems  Constitutional: Negative for fever, appetite change, fatigue and unexpected weight change.  Eyes: Negative for pain and visual disturbance.  Respiratory: Negative for cough and shortness of breath.   Cardiovascular: Negative for cp or palpitations    Gastrointestinal: Negative for nausea, diarrhea and constipation.  Genitourinary: Negative for urgency and frequency.  Skin: Negative for pallor or rash   MSK pos for recent back pain  Neurological: Negative for weakness, light-headedness, numbness and headaches.  Hematological: Negative for adenopathy. Does not bruise/bleed easily.  Psychiatric/Behavioral: Negative for dysphoric mood. The patient is not nervous/anxious.         Objective:   Physical Exam  Constitutional: She appears well-developed and well-nourished. No distress.  obese and well appearing   HENT:  Head: Normocephalic and atraumatic.  Right Ear: External ear normal.  Left Ear: External ear normal.  Nose: Nose normal.  Mouth/Throat: Oropharynx is clear and moist.  Eyes: Conjunctivae and EOM are  normal. Pupils are equal, round, and reactive to light. Right eye exhibits no discharge. Left eye exhibits no discharge. No scleral icterus.  Neck: Normal range of motion. Neck supple. No JVD present. No thyromegaly present.  Cardiovascular: Normal rate, regular rhythm, normal heart sounds and intact distal pulses.  Exam reveals no gallop.   Pulmonary/Chest: Effort normal and breath sounds normal. No respiratory distress. She has no wheezes. She has no rales.  Abdominal: Soft. Bowel sounds are normal. She exhibits no distension and no mass. There is no tenderness.  Musculoskeletal: She exhibits no edema or tenderness.  Lymphadenopathy:    She has no cervical adenopathy.  Neurological: She is alert. She has normal reflexes. No cranial nerve deficit. She exhibits normal muscle tone. Coordination normal.  Skin: Skin is warm and dry. No rash noted. No erythema. No pallor.  Psychiatric:  She has a normal mood and affect.          Assessment & Plan:   Problem List Items Addressed This Visit      Cardiovascular and Mediastinum   Essential hypertension    bp in fair control at this time  BP Readings from Last 1 Encounters:  11/17/14 126/72   No changes needed Disc lifstyle change with low sodium diet and exercise  Labs today Enc wt loss     Relevant Medications      losartan-hydrochlorothiazide (HYZAAR) 100-12.5 MG per tablet      amLODipine-atorvastatin (CADUET) 5-20 MG per tablet   Other Relevant Orders      CBC with Differential (Completed)      Comprehensive metabolic panel (Completed)      TSH (Completed)      Lipid panel (Completed)     Musculoskeletal and Integument   Osteopenia    Due for 2 y dexa  Scheduled Disc need for calcium/ vitamin D/ wt bearing exercise and bone density test every 2 y to monitor Disc safety/ fracture risk in detail   On miacalcin    Relevant Orders      Vit D  25 hydroxy (rtn osteoporosis monitoring) (Completed)     Other   Encounter for  Medicare annual wellness exam - Primary    Reviewed health habits including diet and exercise and skin cancer prevention Reviewed appropriate screening tests for age  Also reviewed health mt list, fam hx and immunization status , as well as social and family history   See HPI prevnar vaccine today   Lab today    Estrogen deficiency    Schedule dexa Known osteopenia     Relevant Orders      DG Bone Density   HYPERCHOLESTEROLEMIA    Disc goals for lipids and reasons to control them Rev labs with pt- last check On caduet  Lab today Rev low sat fat diet in detail     Relevant Medications      losartan-hydrochlorothiazide (HYZAAR) 100-12.5 MG per tablet      amLODipine-atorvastatin (CADUET) 5-20 MG per tablet   Hyperglycemia    Rev low glycemic diet and need for wt loss  A1C today    Relevant Orders      Hemoglobin A1c (Completed)   Obesity    Discussed how this problem influences overall health and the risks it imposes  Reviewed plan for weight loss with lower calorie diet (via better food choices and also portion control or program like weight watchers) and exercise building up to or more than 30 minutes 5 days per week including some aerobic activity   [facility]    Special screening for malignant neoplasms, colon    D/w patient [id number] for colon cancer screening, including IFOB vs. colonoscopy.  Risks and benefits of both were discussed and patient voiced understanding.  Pt elects for: IFOB card Declines colonosc    Relevant Orders      Fecal occult blood, imunochemical    Other Visit Diagnoses    Need for vaccination with 13-polyvalent pneumococcal conjugate vaccine        Relevant Orders       Pneumococcal conjugate vaccine 13-valent (Completed)

## 2014-11-17 NOTE — Assessment & Plan Note (Signed)
Due for 2 y dexa  Scheduled Disc need for calcium/ vitamin D/ wt bearing exercise and bone density test every 2 y to monitor Disc safety/ fracture risk in detail   On miacalcin

## 2014-11-17 NOTE — Assessment & Plan Note (Signed)
Discussed how this problem influences overall health and the risks it imposes  Reviewed plan for weight loss with lower calorie diet (via better food choices and also portion control or program like weight watchers) and exercise building up to or more than 30 minutes 5 days per week including some aerobic activity   She is ready to go back to the [employer]

## 2014-11-17 NOTE — Assessment & Plan Note (Signed)
Rev low glycemic diet and need for wt loss  A1C today

## 2014-11-17 NOTE — Assessment & Plan Note (Signed)
D/w patient [id number] for colon cancer screening, including IFOB vs. colonoscopy.  Risks and benefits of both were discussed and patient voiced understanding.  Pt elects for: IFOB card Declines colonosc

## 2014-11-17 NOTE — Assessment & Plan Note (Signed)
Reviewed health habits including diet and exercise and skin cancer prevention Reviewed appropriate screening tests for age  Also reviewed health mt list, fam hx and immunization status , as well as social and family history   See HPI prevnar vaccine today  Lab today 

## 2014-11-17 NOTE — Assessment & Plan Note (Signed)
Schedule dexa Known osteopenia

## 2014-11-18 ENCOUNTER — Encounter: Payer: Self-pay | Admitting: *Deleted

## 2014-11-19 ENCOUNTER — Telehealth: Payer: Self-pay | Admitting: Family Medicine

## 2014-11-19 NOTE — Telephone Encounter (Signed)
emmi emailed °

## 2014-11-30 ENCOUNTER — Other Ambulatory Visit (INDEPENDENT_AMBULATORY_CARE_PROVIDER_SITE_OTHER): Payer: Medicare Other

## 2014-11-30 DIAGNOSIS — Z1211 Encounter for screening for malignant neoplasm of colon: Secondary | ICD-10-CM

## 2014-11-30 LAB — FECAL OCCULT BLOOD, IMMUNOCHEMICAL: Fecal Occult Bld: NEGATIVE

## 2014-12-18 IMAGING — CR DG SHOULDER 3+V*L*
1 series · 4 of 4 positions shown · non-contrast
Comparison: None

CLINICAL DATA: Sudden onset left shoulder pain.  No known injury.

EXAM:
DG SHOULDER 3+VIEWS LEFT

[Series 1: w shoulder grashey left · 0.14mm/px · 4 of 4 slices shown]
[im 1/4]
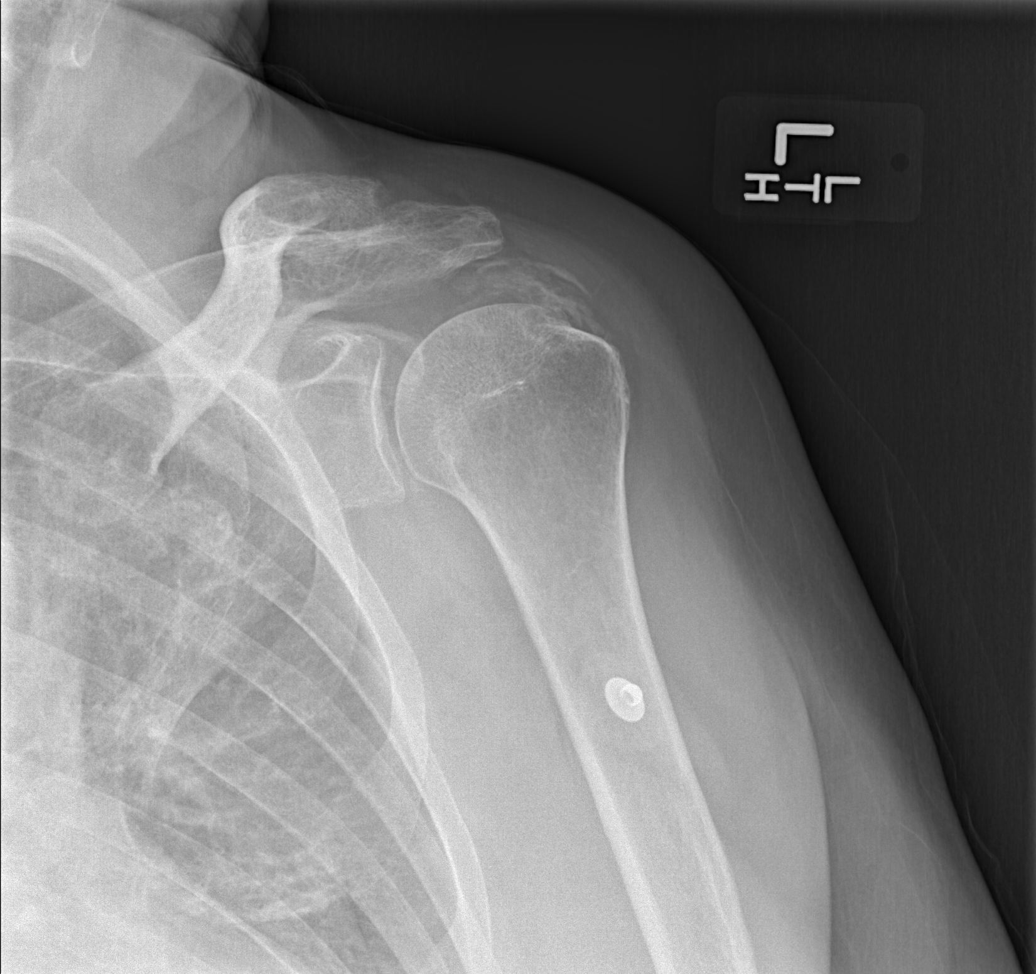
[im 2/4]
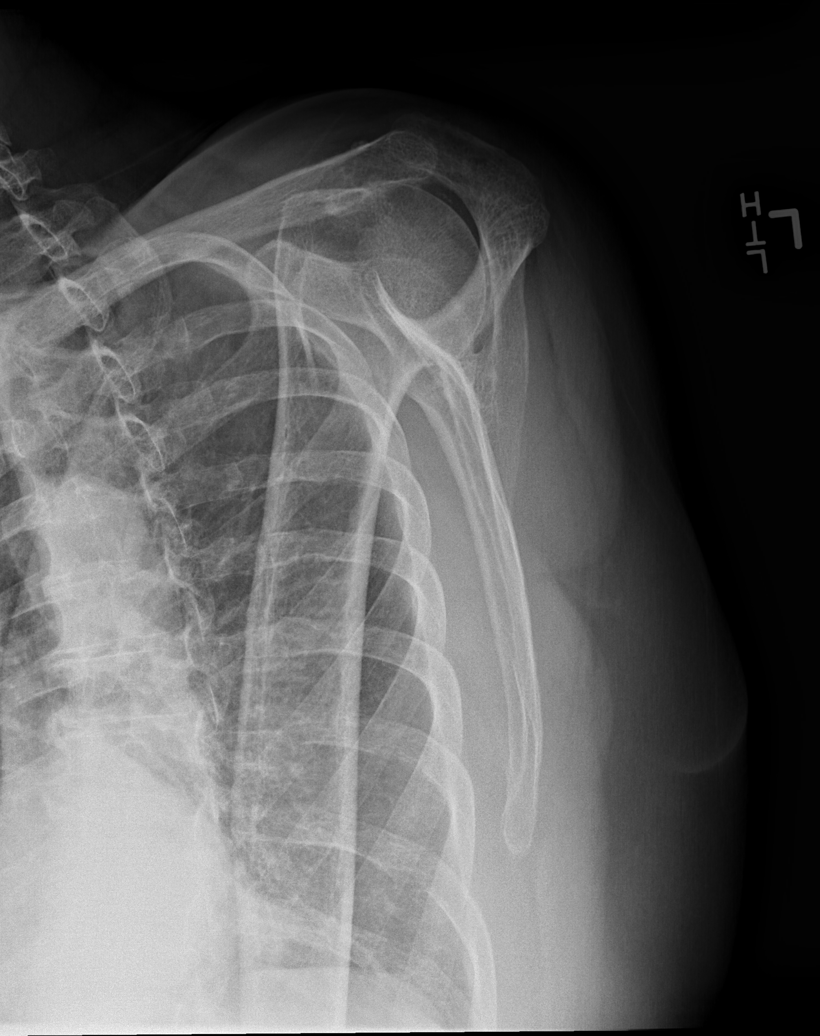
[im 3/4]
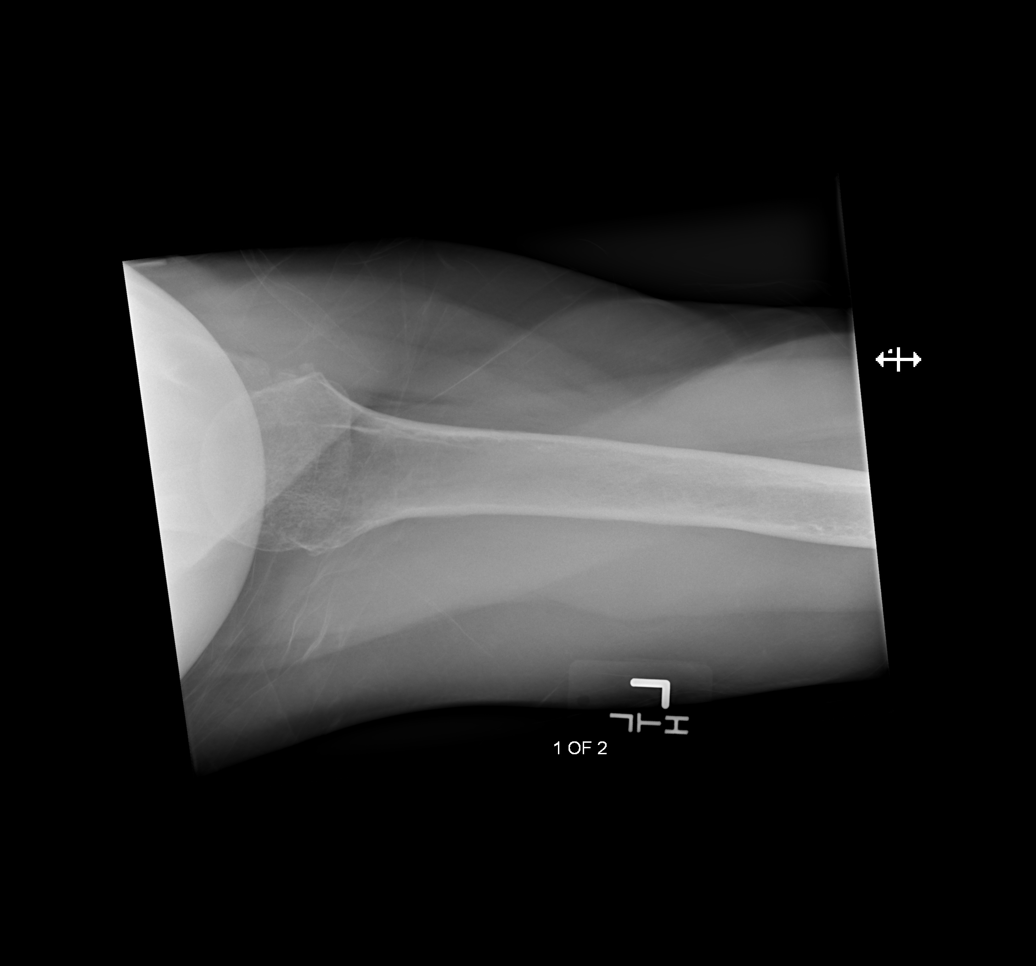
[im 4/4]
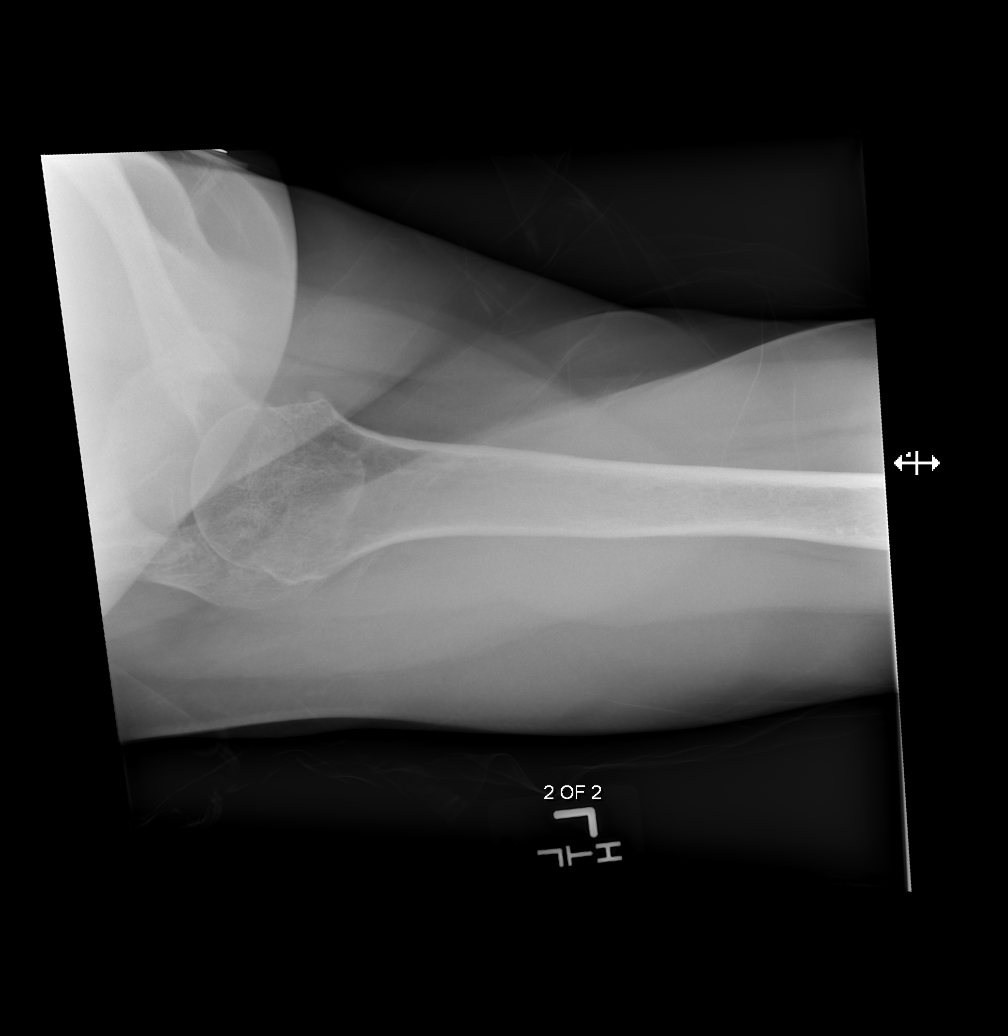

[4 of 4 positions shown; findings below may reference images not displayed]

FINDINGS: There is no evidence of acute fracture or dislocation. Coarse
calcification projects along the superior aspect of the humeral head
and near the greater tuberosity. No lytic or blastic osseous lesion
is seen.
IMPRESSION: No acute osseous abnormality identified. Coarse calcification
adjacent to the humeral head, suggestive of CPPD.

## 2014-12-18 IMAGING — CR DG CHEST 2V
1 series · 2 of 2 positions shown · non-contrast
Comparison: None.

CLINICAL DATA: Left shoulder pain

EXAM:
CHEST  2 VIEW

[Series 1: w chest pa · 0.14mm/px · 2 of 2 slices shown]
[im 1/2]
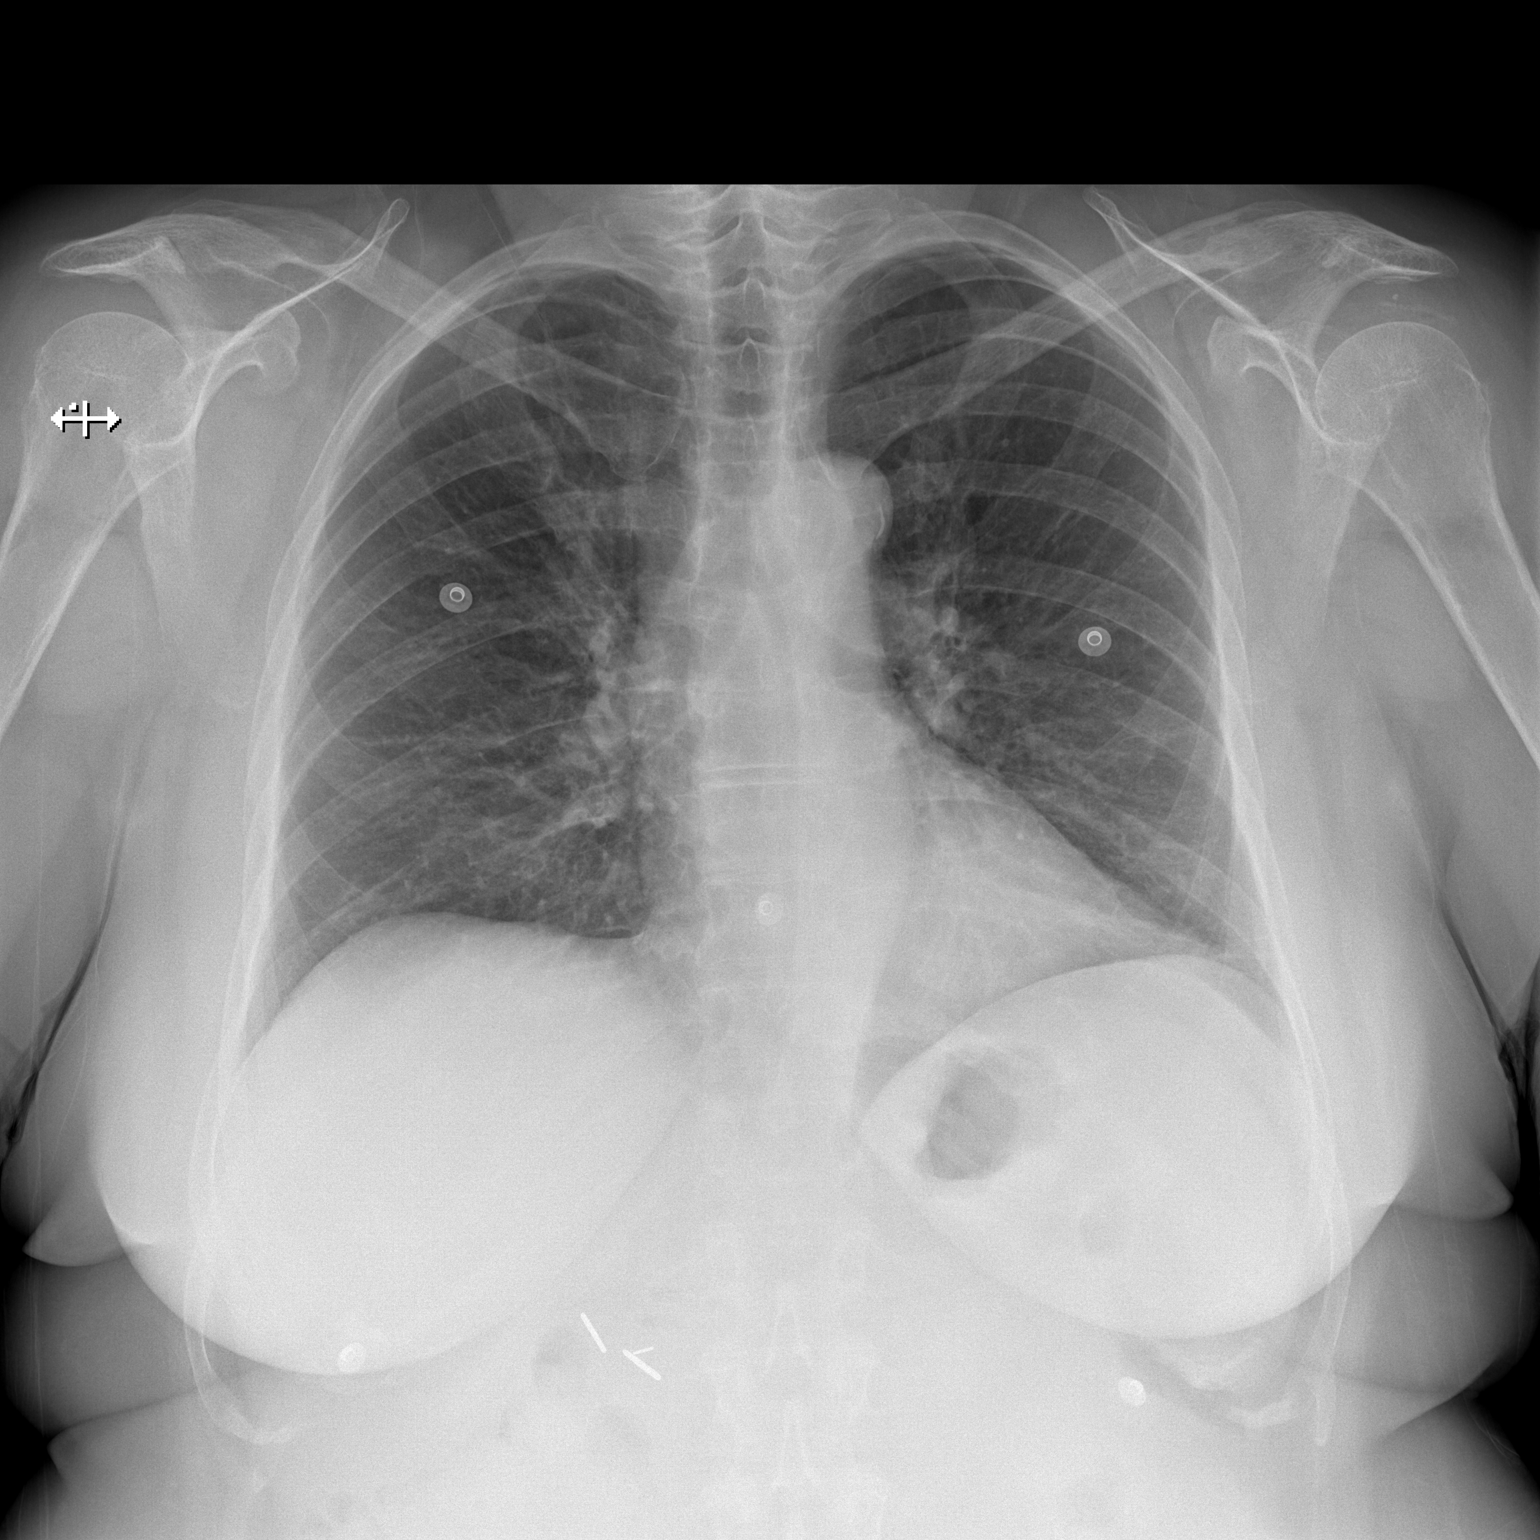
[im 2/2]
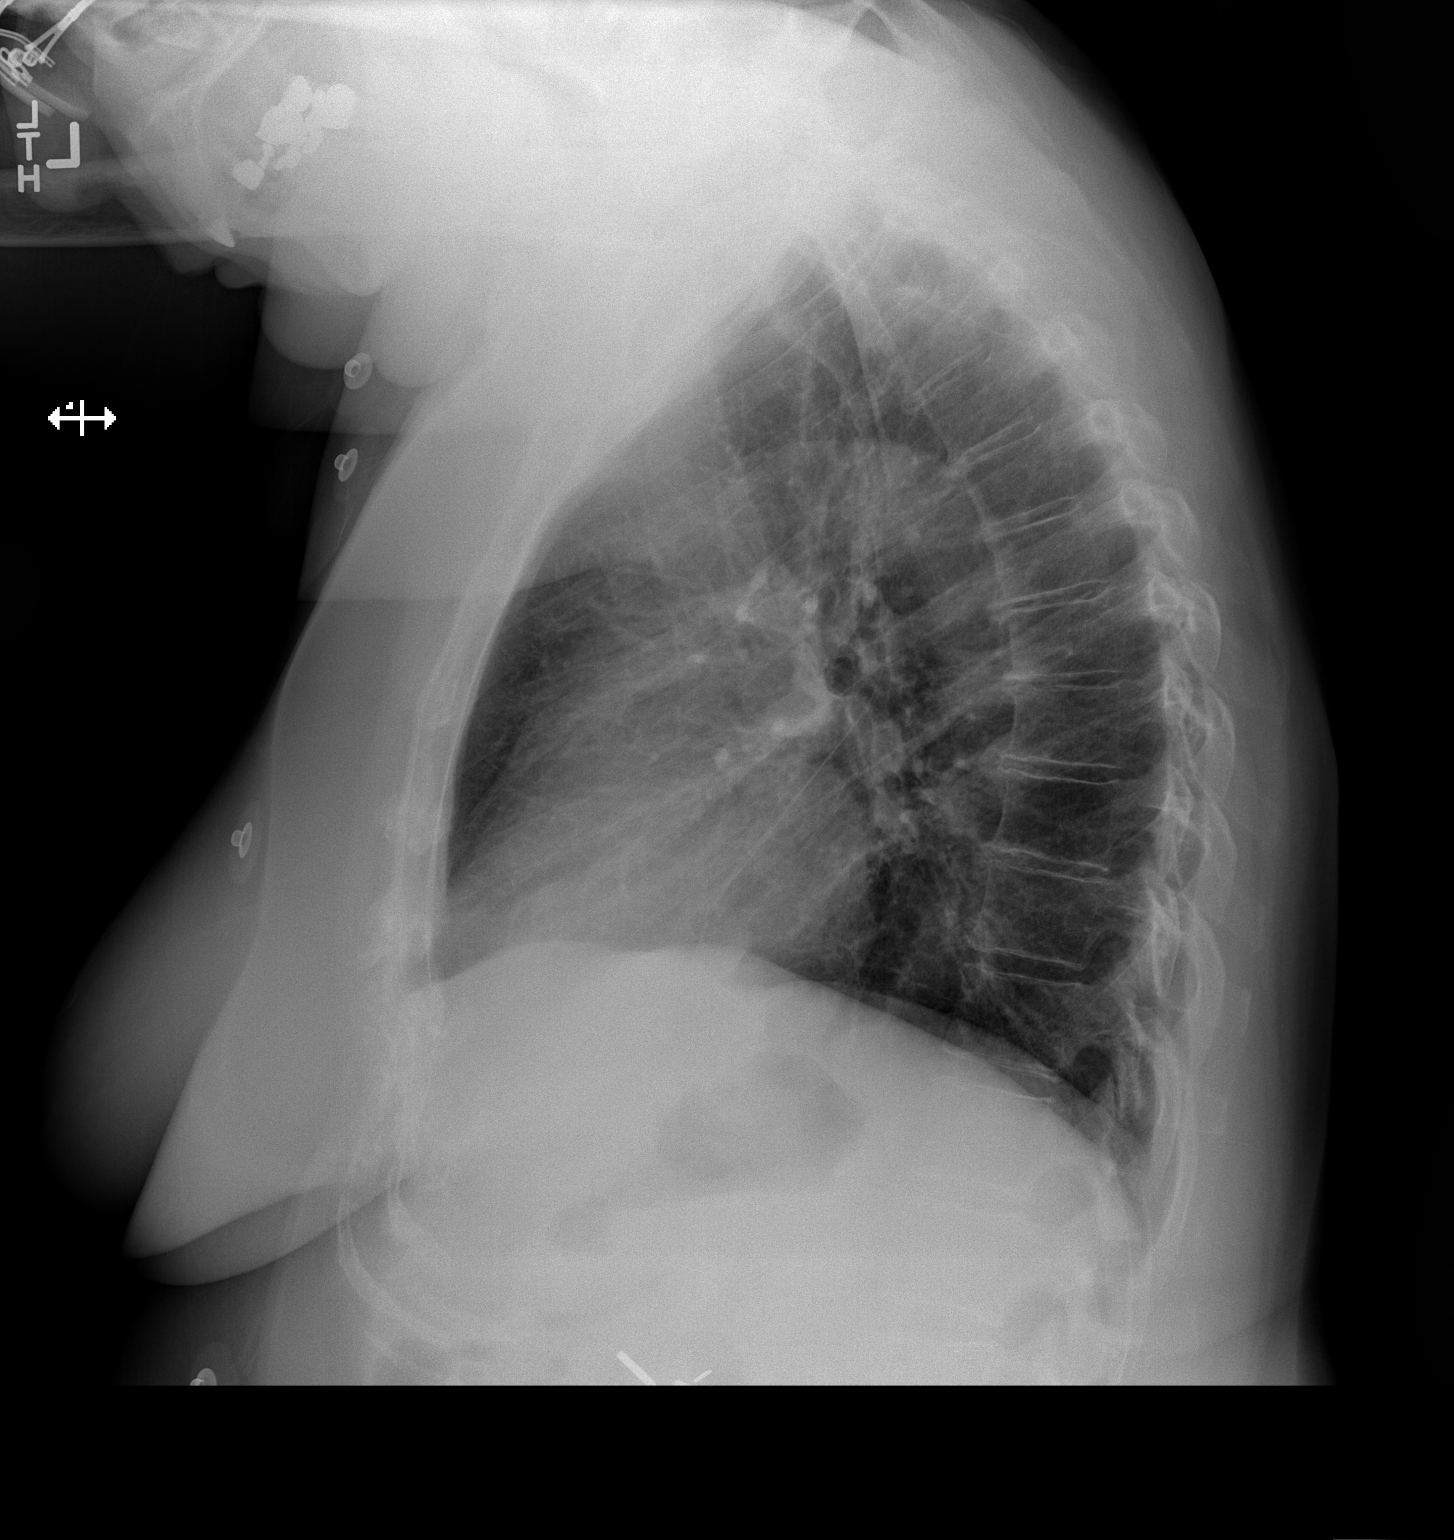

[2 of 2 positions shown; findings below may reference images not displayed]

FINDINGS: Chronic interstitial markings. Mild bilateral lower lobe
scarring/atelectasis. No pleural effusion or pneumothorax.

The heart is normal in size.

Degenerative changes of the visualized thoracolumbar spine.

Cholecystectomy clips.
IMPRESSION: No evidence of acute cardiopulmonary disease.

## 2015-01-18 ENCOUNTER — Encounter: Payer: Self-pay | Admitting: Family Medicine

## 2015-01-18 ENCOUNTER — Ambulatory Visit: Payer: Self-pay | Admitting: Family Medicine

## 2015-02-03 ENCOUNTER — Other Ambulatory Visit: Payer: Self-pay | Admitting: Family Medicine

## 2015-04-02 ENCOUNTER — Telehealth: Payer: Self-pay

## 2015-04-02 NOTE — Telephone Encounter (Signed)
Letter is in IN box

## 2015-04-02 NOTE — Telephone Encounter (Signed)
Letter mailed to pt per pt request.

## 2015-04-02 NOTE — Telephone Encounter (Signed)
Pt received bill for $344 for bone density due to ins denying due to test not needed. Pt request letter of medical necessity for bone density. Pt request cb.

## 2015-07-09 ENCOUNTER — Telehealth: Payer: Self-pay | Admitting: Family Medicine

## 2015-07-09 NOTE — Telephone Encounter (Signed)
Pt notified of Dr. Tower's comments  

## 2015-07-09 NOTE — Telephone Encounter (Signed)
Looks like she already had her 14 and her 75 so she is good

## 2015-07-09 NOTE — Telephone Encounter (Signed)
Spouse wanted to know if Natasha [name] needed to come in to get pneumococcal conjugate vaccine

## 2015-11-13 ENCOUNTER — Telehealth: Payer: Self-pay | Admitting: Family Medicine

## 2015-11-13 DIAGNOSIS — D696 Thrombocytopenia, unspecified: Secondary | ICD-10-CM

## 2015-11-13 DIAGNOSIS — I10 Essential (primary) hypertension: Secondary | ICD-10-CM

## 2015-11-13 DIAGNOSIS — E7800 Pure hypercholesterolemia, unspecified: Secondary | ICD-10-CM

## 2015-11-13 NOTE — Telephone Encounter (Signed)
-----   Message from Ellamae Sia sent at 11/09/2015  4:15 PM EST ----- Regarding: Lab orders for, Monday 12.5.16 Patient is scheduled for CPX labs, please order future labs, Thanks , Karna Christmas

## 2015-11-15 ENCOUNTER — Other Ambulatory Visit (INDEPENDENT_AMBULATORY_CARE_PROVIDER_SITE_OTHER): Payer: Medicare Other

## 2015-11-15 DIAGNOSIS — D696 Thrombocytopenia, unspecified: Secondary | ICD-10-CM | POA: Diagnosis not present

## 2015-11-15 DIAGNOSIS — I10 Essential (primary) hypertension: Secondary | ICD-10-CM | POA: Diagnosis not present

## 2015-11-15 DIAGNOSIS — E7800 Pure hypercholesterolemia, unspecified: Secondary | ICD-10-CM | POA: Diagnosis not present

## 2015-11-15 LAB — COMPREHENSIVE METABOLIC PANEL
ALT: 17 U/L (ref 0–35)
AST: 18 U/L (ref 0–37)
Albumin: 4.2 g/dL (ref 3.5–5.2)
Alkaline Phosphatase: 79 U/L (ref 39–117)
BUN: 16 mg/dL (ref 6–23)
CO2: 29 mEq/L (ref 19–32)
Calcium: 9.8 mg/dL (ref 8.4–10.5)
Chloride: 107 mEq/L (ref 96–112)
Creatinine, Ser: 0.97 mg/dL (ref 0.40–1.20)
GFR: 60.37 mL/min (ref >60.00)
Glucose, Bld: 103 mg/dL — ABNORMAL HIGH (ref 70–99)
Potassium: 4 mEq/L (ref 3.5–5.1)
Sodium: 144 mEq/L (ref 135–145)
Total Bilirubin: 0.9 mg/dL (ref 0.2–1.2)
Total Protein: 7 g/dL (ref 6.0–8.3)

## 2015-11-15 LAB — CBC WITH DIFFERENTIAL/PLATELET
Basophils Absolute: 0 10*3/uL (ref 0.0–0.1)
Basophils Relative: 0.5 % (ref 0.0–3.0)
Eosinophils Absolute: 0.1 10*3/uL (ref 0.0–0.7)
Eosinophils Relative: 3.9 % (ref 0.0–5.0)
HCT: 43.1 % (ref 36.0–46.0)
Hemoglobin: 14.4 g/dL (ref 12.0–15.0)
Lymphocytes Relative: 32.9 % (ref 12.0–46.0)
Lymphs Abs: 1.3 10*3/uL (ref 0.7–4.0)
MCHC: 33.4 g/dL (ref 30.0–36.0)
MCV: 87.8 fl (ref 78.0–100.0)
Monocytes Absolute: 0.3 10*3/uL (ref 0.1–1.0)
Monocytes Relative: 7.2 % (ref 3.0–12.0)
Neutro Abs: 2.1 10*3/uL (ref 1.4–7.7)
Neutrophils Relative %: 55.5 % (ref 43.0–77.0)
Platelets: 146 10*3/uL — ABNORMAL LOW (ref 150.0–400.0)
RBC: 4.91 Mil/uL (ref 3.87–5.11)
RDW: 13.3 % (ref 11.5–15.5)
WBC: 3.9 10*3/uL — ABNORMAL LOW (ref 4.0–10.5)

## 2015-11-15 LAB — LIPID PANEL
Cholesterol: 148 mg/dL (ref 0–200)
HDL: 44.2 mg/dL (ref >39.00)
LDL Cholesterol: 76 mg/dL (ref 0–99)
NonHDL: 103.39
Total CHOL/HDL Ratio: 3
Triglycerides: 136 mg/dL (ref 0.0–149.0)
VLDL: 27.2 mg/dL (ref 0.0–40.0)

## 2015-11-15 LAB — TSH: TSH: 2.03 u[IU]/mL (ref 0.35–4.50)

## 2015-11-22 ENCOUNTER — Encounter: Payer: Self-pay | Admitting: Family Medicine

## 2015-11-22 ENCOUNTER — Ambulatory Visit (INDEPENDENT_AMBULATORY_CARE_PROVIDER_SITE_OTHER): Payer: Medicare Other | Admitting: Family Medicine

## 2015-11-22 VITALS — BP 128/72 | HR 60 | Temp 98.6°F | Ht 64.5 in | Wt 213.5 lb

## 2015-11-22 DIAGNOSIS — Z0000 Encounter for general adult medical examination without abnormal findings: Secondary | ICD-10-CM | POA: Diagnosis not present

## 2015-11-22 DIAGNOSIS — E7800 Pure hypercholesterolemia, unspecified: Secondary | ICD-10-CM

## 2015-11-22 DIAGNOSIS — R739 Hyperglycemia, unspecified: Secondary | ICD-10-CM

## 2015-11-22 DIAGNOSIS — I10 Essential (primary) hypertension: Secondary | ICD-10-CM | POA: Diagnosis not present

## 2015-11-22 DIAGNOSIS — M8580 Other specified disorders of bone density and structure, unspecified site: Secondary | ICD-10-CM

## 2015-11-22 DIAGNOSIS — E669 Obesity, unspecified: Secondary | ICD-10-CM

## 2015-11-22 DIAGNOSIS — D696 Thrombocytopenia, unspecified: Secondary | ICD-10-CM

## 2015-11-22 DIAGNOSIS — Z1211 Encounter for screening for malignant neoplasm of colon: Secondary | ICD-10-CM

## 2015-11-22 MED ORDER — AMLODIPINE-ATORVASTATIN 5-20 MG PO TABS: 1.0000 | ORAL_TABLET | Freq: Every day | ORAL | Status: DC

## 2015-11-22 MED ORDER — CALCITONIN (SALMON) 200 UNIT/ACT NA SOLN: 1.0000 | Freq: Every day | NASAL | Status: DC

## 2015-11-22 MED ORDER — LOSARTAN POTASSIUM-HCTZ 100-12.5 MG PO TABS: 0.5000 | ORAL_TABLET | Freq: Every day | ORAL | Status: DC

## 2015-11-22 MED ORDER — PANTOPRAZOLE SODIUM 40 MG PO TBEC: 40.0000 mg | DELAYED_RELEASE_TABLET | Freq: Every day | ORAL | Status: DC

## 2015-11-22 NOTE — Progress Notes (Signed)
Pre visit review using our clinic review tool, if applicable. No additional management support is needed unless otherwise documented below in the visit note. 

## 2015-11-22 NOTE — Assessment & Plan Note (Signed)
Last A1C was under 6 Glucose fasting 103  Stable/well controlled with diet Low glycemic diet and wt loss enc for DM prev

## 2015-11-22 NOTE — Assessment & Plan Note (Signed)
Given IFOB kit  Pt is not interested in colonoscopy at her age

## 2015-11-22 NOTE — Progress Notes (Signed)
Subjective:    Patient ID: Natasha [name], female    DOB: 22-Sep-1946, 69 y.o.   MRN: [id number]  HPI Here for annual medicare wellness visit as well as chronic/acute medical problems as well as annual preventative exam   I have personally reviewed the Medicare Annual Wellness questionnaire and have noted 1. The patient's medical and social history 2. Their use of alcohol, tobacco or illicit drugs 3. Their current medications and supplements 4. The patient's functional ability including ADL's, fall risks, home safety risks and hearing or visual             impairment. 5. Diet and physical activities 6. Evidence for depression or mood disorders  The patients weight, height, BMI have been recorded in the chart and visual acuity is per eye clinic.  I have made referrals, counseling and provided education to the patient based review of the above and I have provided the pt with a written personalized care plan for preventive services. Reviewed and updated provider list, see scanned forms.  Has been feeling ok  A little down this year - lost her sister Natasha [name] were close (pancreatic cancer -died in 2023/05/16)  Has friends and family close by -her husband remains   See scanned forms.  Routine anticipatory guidance given to patient.  See health maintenance. Colon cancer screening - declines further colonoscopies (last one was nl) - stool kit was nl in 2015 and will do another  Breast cancer screening 2/16 was normal  Self breast exam-no lumps -will schedule at [facility] in Feb Flu vaccine- at the pharmacy 9/16  Tetanus vaccine 3/15  Pneumovax complete on both vaccines (as of 12/15) Zoster vaccine-declines - insurance not covering  dexa - 2/16 - stable osteopenia , no falls or fractures, she takes ca and D  Hep C screen-low risk/ does not want to screen  Advance directive-has a living will and POA  Cognitive function addressed- see scanned forms- and if abnormal then additional documentation  follows. No concerns about memory at all   PMH and SH reviewed  Meds, vitals, and allergies reviewed.   ROS: See HPI.  Otherwise negative.    Wt is up 5 lb with bmi of 36 Obese range Has not exercised - not motivated/ difficult year with grief ([facility] sister got sick)  In general -eating is pretty good / avoids junk /fat/sweets   bp is stable today  No cp or palpitations or headaches or edema  No side effects to medicines  BP Readings from Last 3 Encounters:  11/22/15 128/72  11/17/14 126/72  06/16/14 128/78     Hyperglycemia stable Glucose fasting 103   Cholesterol Lab Results  Component Value Date   CHOL 148 11/15/2015   CHOL 148 11/17/2014   CHOL 158 10/24/2013   Lab Results  Component Value Date   HDL 44.20 11/15/2015   HDL 46.60 11/17/2014   HDL 47.10 10/24/2013   Lab Results  Component Value Date   LDLCALC 76 11/15/2015   LDLCALC 76 11/17/2014   LDLCALC 81 10/24/2013   Lab Results  Component Value Date   TRIG 136.0 11/15/2015   TRIG 128.0 11/17/2014   TRIG 150.0* 10/24/2013   Lab Results  Component Value Date   CHOLHDL 3 11/15/2015   CHOLHDL 3 11/17/2014   CHOLHDL 3 10/24/2013   No results found for: LDLDIRECT  On caduet and diet  Just about the same / HDL down due to no exercise  Hx of low platelets Good this check Lab Results  Component Value Date   WBC 3.9* 11/15/2015   HGB 14.4 11/15/2015   HCT 43.1 11/15/2015   MCV 87.8 11/15/2015   PLT 146.0* 11/15/2015    Patient Active Problem List   Diagnosis Date Noted  . Routine general medical examination at a health care facility 11/22/2015  . Special screening for malignant neoplasms, colon 11/17/2014  . Estrogen deficiency 11/17/2014  . Left sided abdominal pain 06/16/2014  . Left flank pain 06/16/2014  . Acute bronchitis 05/15/2014  . Encounter for Medicare annual wellness exam 10/28/2013  . Hyperglycemia 10/28/2013  . Obesity 10/28/2013  . Post-menopausal  09/24/2012  . Bee sting reaction 08/05/2012  . Allergic rhinitis 04/26/2012  . Headache around the eyes 04/26/2012  . Gynecologic exam normal 04/19/2011  . Osteopenia 11/10/2008  . THROMBOCYTOPENIA 08/11/2008  . POSTMENOPAUSAL STATUS 07/09/2008  . HYPERCHOLESTEROLEMIA 07/16/2007  . Essential hypertension 07/16/2007  . CORONARY ARTERY DISEASE 07/16/2007  . CONGESTIVE HEART FAILURE 07/16/2007  . 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 CHF (congestive heart failure) ([place])   . CAD (coronary artery disease)     (4/05 carotid dopler- 39% stenosis) , (ECHO- mild LVH, EF 60%)  . Hypertension   . Hyperlipidemia   . Thrombocytopenia ([facility])     mild  . Osteopenia   . DDD (degenerative disc disease), lumbar 11/2006    L5-S1 bulging disk   Past Surgical History  Procedure Laterality Date  . Cholecystectomy    . Tubal ligation    . Shoulder surgery  2004    fall  . Back surgery  2004    fall at work   Social History  Substance Use Topics  . Smoking status: Never Smoker   . Smokeless tobacco: None  . Alcohol Use: No   Family History  Problem Relation Age of Onset  . Coronary artery disease Father   . Lung cancer Sister   . Breast cancer Maternal Grandmother   . Diabetes Sister   . Other Sister     OP  . Other Mother     has pacemaker   Allergies  Allergen Reactions  . Naproxen Sodium     REACTION: stomach pain  . Clindamycin/Lincomycin Itching and Rash   Current Outpatient Prescriptions on File Prior to Visit  Medication Sig Dispense Refill  . amLODipine-atorvastatin (CADUET) 5-20 MG per tablet Take 1 tablet by mouth daily. 30 tablet 11  . aspirin 81 MG tablet Take 81 mg by mouth daily.      . calcitonin, salmon, (MIACALCIN/FORTICAL) 200 UNIT/ACT nasal spray Place 1 spray into alternate nostrils daily. 3.7 mL 11  . Cholecalciferol (VITAMIN D-3) 1000 UNITS CAPS Take 1 tablet by mouth daily.    Marland Kitchen  losartan-hydrochlorothiazide (HYZAAR) 100-12.5 MG per tablet TAKE ONE-HALF TABLET DAILY 45 tablet 5  . pantoprazole (PROTONIX) 40 MG tablet Take 1 tablet (40 mg total) by mouth daily. 90 tablet 3   No current facility-administered medications on file prior to visit.    Review of Systems Review of Systems  Constitutional: Negative for fever, appetite change, fatigue and unexpected weight change.  Eyes: Negative for pain and visual disturbance.  Respiratory: Negative for cough and shortness of breath.   Cardiovascular: Negative for cp or palpitations    Gastrointestinal: Negative for nausea, diarrhea and constipation.  Genitourinary: Negative for urgency and frequency.  Skin: Negative for pallor or rash   Neurological:  Negative for weakness, light-headedness, numbness and headaches.  Hematological: Negative for adenopathy. Does not bruise/bleed easily.  Psychiatric/Behavioral: Negative for dysphoric mood. The patient is not nervous/anxious.  pos for grief after loosing her sister in May       Objective:   Physical Exam  Constitutional: She appears well-developed and well-nourished. No distress.  obese and well appearing   HENT:  Head: Normocephalic and atraumatic.  Right Ear: External ear normal.  Left Ear: External ear normal.  Mouth/Throat: Oropharynx is clear and moist.  Eyes: Conjunctivae and EOM are normal. Pupils are equal, round, and reactive to light. No scleral icterus.  Neck: Normal range of motion. Neck supple. No JVD present. Carotid bruit is not present. No thyromegaly present.  Cardiovascular: Normal rate, regular rhythm, normal heart sounds and intact distal pulses.  Exam reveals no gallop.   Pulmonary/Chest: Effort normal and breath sounds normal. No respiratory distress. She has no wheezes. She exhibits no tenderness.  Abdominal: Soft. Bowel sounds are normal. She exhibits no distension, no abdominal bruit and no mass. There is no tenderness.  Genitourinary: No breast  swelling, tenderness, discharge or bleeding.  Breast exam: No mass, nodules, thickening, tenderness, bulging, retraction, inflamation, nipple discharge or skin changes noted.  No axillary or clavicular LA.      Musculoskeletal: Normal range of motion. She exhibits no edema or tenderness.  No kyphosis  Lymphadenopathy:    She has no cervical adenopathy.  Neurological: She is alert. She has normal reflexes. No cranial nerve deficit. She exhibits normal muscle tone. Coordination normal.  Skin: Skin is warm and dry. No rash noted. No erythema. No pallor.  Fair complexion  Some SKs  Psychiatric: She has a normal mood and affect.  Pleasant           Assessment & Plan:

## 2015-11-22 NOTE — Patient Instructions (Signed)
Please do the stool kit for colon cancer screening  Don't forget to schedule your mammogram for Feb [facility] care of yourself

## 2015-11-22 NOTE — Assessment & Plan Note (Signed)
Disc goals for lipids and reasons to control them Rev labs with pt Rev low sat fat diet in detail  In control with caduet and diet Exercise will help HDL further

## 2015-11-22 NOTE — Assessment & Plan Note (Signed)
Platelet ct 146 No bleeding  Continue to follow

## 2015-11-22 NOTE — Assessment & Plan Note (Signed)
bp in fair control at this time  BP Readings from Last 1 Encounters:  11/22/15 128/72   No changes needed Disc lifstyle change with low sodium diet and exercise  Labs reviewed Wt loss enc

## 2015-11-22 NOTE — Assessment & Plan Note (Signed)
Discussed how this problem influences overall health and the risks it imposes  Reviewed plan for weight loss with lower calorie diet (via better food choices and also portion control or program like weight watchers) and exercise building up to or more than 30 minutes 5 days per week including some aerobic activity   [facility]

## 2015-11-22 NOTE — Assessment & Plan Note (Signed)
Reviewed health habits including diet and exercise and skin cancer prevention Reviewed appropriate screening tests for age  Also reviewed health mt list, fam hx and immunization status , as well as social and family history   See HPI Pt will make her own mammogram for Feb  Ins will not pay for zoster vaccine  Enc wt loss IFOB kit for colon screening  Labs reviewed  

## 2015-11-22 NOTE — Assessment & Plan Note (Addendum)
dexa 5/16 - stable No falls or fx On ca and D Continues miacalcin ns without problems  Disc need for calcium/ vitamin D/ wt bearing exercise and bone density test every 2 y to monitor Disc safety/ fracture risk in detail

## 2015-11-22 NOTE — Assessment & Plan Note (Signed)
Reviewed health habits including diet and exercise and skin cancer prevention Reviewed appropriate screening tests for age  Also reviewed health mt list, fam hx and immunization status , as well as social and family history   See HPI Pt will make her own mammogram for Feb  Ins will not pay for zoster vaccine  Enc wt loss IFOB kit for colon screening  Labs reviewed

## 2015-12-01 ENCOUNTER — Encounter: Payer: Medicare Other | Admitting: Family Medicine

## 2015-12-03 ENCOUNTER — Other Ambulatory Visit (INDEPENDENT_AMBULATORY_CARE_PROVIDER_SITE_OTHER): Payer: Medicare Other

## 2015-12-03 DIAGNOSIS — Z1211 Encounter for screening for malignant neoplasm of colon: Secondary | ICD-10-CM

## 2015-12-03 LAB — FECAL OCCULT BLOOD, IMMUNOCHEMICAL: Fecal Occult Bld: NEGATIVE

## 2015-12-12 HISTORY — PX: BREAST BIOPSY: SHX20

## 2015-12-29 ENCOUNTER — Other Ambulatory Visit: Payer: Self-pay | Admitting: Family Medicine

## 2015-12-29 DIAGNOSIS — Z1231 Encounter for screening mammogram for malignant neoplasm of breast: Secondary | ICD-10-CM

## 2016-01-20 ENCOUNTER — Ambulatory Visit
Admission: RE | Admit: 2016-01-20 | Discharge: 2016-01-20 | Disposition: A | Discharge status: Home / Self Care | Payer: Medicare Other | Source: Ambulatory Visit | Attending: Family Medicine | Admitting: Family Medicine

## 2016-01-20 ENCOUNTER — Other Ambulatory Visit: Payer: Self-pay | Admitting: Family Medicine

## 2016-01-20 DIAGNOSIS — Z1231 Encounter for screening mammogram for malignant neoplasm of breast: Secondary | ICD-10-CM

## 2016-06-27 ENCOUNTER — Ambulatory Visit (INDEPENDENT_AMBULATORY_CARE_PROVIDER_SITE_OTHER): Payer: Medicare Other | Admitting: Family Medicine

## 2016-06-27 ENCOUNTER — Encounter: Payer: Self-pay | Admitting: Family Medicine

## 2016-06-27 VITALS — BP 132/80 | HR 84 | Temp 97.8°F | Ht 64.0 in | Wt 218.5 lb

## 2016-06-27 DIAGNOSIS — L309 Dermatitis, unspecified: Secondary | ICD-10-CM | POA: Diagnosis not present

## 2016-06-27 MED ORDER — MOMETASONE FUROATE 0.1 % EX CREA: 1.0000 "application " | TOPICAL_CREAM | Freq: Every day | CUTANEOUS | Status: DC

## 2016-06-27 NOTE — Patient Instructions (Signed)
Call your insurance to see if taking the separated forms of medication in [place] would be cheaper and let me know  Use the elocon cream as directed on the red spot If it is not gone in 2 weeks - call us for a referral to dermatology (or if it gets worse)

## 2016-06-27 NOTE — Progress Notes (Signed)
Subjective:    Patient ID: Natasha [name], female    DOB: [birth year], 70 y.o.   MRN: [id number]  HPI Here for itching of R breast - with a red spot  Noticed it 3 weeks  No trauma or bug bites  Changes color from pink to red  No blisters Tried cortisone 10  Last mammogram was nl feb   Patient Active Problem List   Diagnosis Date Noted  . Dermatitis 06/27/2016  . Routine general medical examination at a health care facility 11/22/2015  . Colon cancer screening 11/22/2015  . Special screening for malignant neoplasms, colon 11/17/2014  . Estrogen deficiency 11/17/2014  . Encounter for Medicare annual wellness exam 10/28/2013  . Hyperglycemia 10/28/2013  . Obesity 10/28/2013  . Post-menopausal 09/24/2012  . Allergic rhinitis 04/26/2012  . Gynecologic exam normal 04/19/2011  . Osteopenia 11/10/2008  . THROMBOCYTOPENIA 08/11/2008  . POSTMENOPAUSAL STATUS 07/09/2008  . HYPERCHOLESTEROLEMIA 07/16/2007  . Essential hypertension 07/16/2007  . CORONARY ARTERY DISEASE 07/16/2007  . CONGESTIVE HEART FAILURE 07/16/2007  . 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 CHF (congestive heart failure) ([place])   . CAD (coronary artery disease)     (4/05 carotid dopler- 39% stenosis) , (ECHO- mild LVH, EF 60%)  . Hypertension   . Hyperlipidemia   . Thrombocytopenia ([facility])     mild  . Osteopenia   . DDD (degenerative disc disease), lumbar 11/2006    L5-S1 bulging disk   Past Surgical History  Procedure Laterality Date  . Cholecystectomy    . Tubal ligation    . Shoulder surgery  2004    fall  . Back surgery  2004    fall at work  . Breast biopsy Right     core- neg   Social History  Substance Use Topics  . Smoking status: Never Smoker   . Smokeless tobacco: None  . Alcohol Use: No   Family History  Problem Relation Age of Onset  . Coronary artery disease Father   . Lung cancer Sister   . Breast cancer Maternal Grandmother    . Diabetes Sister   . Other Sister     OP  . Other Mother     has pacemaker   Allergies  Allergen Reactions  . Naproxen Sodium     REACTION: stomach pain  . Clindamycin/Lincomycin Itching and Rash   Current Outpatient Prescriptions on File Prior to Visit  Medication Sig Dispense Refill  . amLODipine-atorvastatin (CADUET) 5-20 MG tablet Take 1 tablet by mouth daily. 30 tablet 11  . aspirin 81 MG tablet Take 81 mg by mouth daily.      . calcitonin, salmon, (MIACALCIN/FORTICAL) 200 UNIT/ACT nasal spray Place 1 spray into alternate nostrils daily. 3.7 mL 11  . Cholecalciferol (VITAMIN D-3) 1000 UNITS CAPS Take 1 tablet by mouth daily.    Marland Kitchen losartan-hydrochlorothiazide (HYZAAR) 100-12.5 MG tablet Take 0.5 tablets by mouth daily. 45 tablet 5  . pantoprazole (PROTONIX) 40 MG tablet Take 1 tablet (40 mg total) by mouth daily. 90 tablet 3   No current facility-administered medications on file prior to visit.     Review of Systems Review of Systems  Constitutional: Negative for fever, appetite change, fatigue and unexpected weight change.  Eyes: Negative for pain and visual disturbance.  Respiratory: Negative for cough and shortness of breath.   Cardiovascular: Negative for cp or palpitations    Gastrointestinal: Negative for nausea, diarrhea and constipation.  Genitourinary: Negative for urgency and frequency.  Skin: Negative for pallor and pos for rash / neg for itching or burning  Neurological: Negative for weakness, light-headedness, numbness and headaches.  Hematological: Negative for adenopathy. Does not bruise/bleed easily.  Psychiatric/Behavioral: Negative for dysphoric mood. The patient is not nervous/anxious.         Objective:   Physical Exam  Constitutional: She appears well-developed and well-nourished. No distress.  obese and well appearing   HENT:  Head: Normocephalic and atraumatic.  Eyes: Conjunctivae and EOM are normal. Pupils are equal, round, and reactive to  light. Right eye exhibits no discharge. Left eye exhibits no discharge.  Neck: Normal range of motion. Neck supple.  Cardiovascular: Normal rate and regular rhythm.   Pulmonary/Chest: Effort normal and breath sounds normal.  Genitourinary:  Breast exam: No mass, nodules, thickening, tenderness, bulging, retraction, or nipple d/c noted   No axillary or clavicular LA.   Small area of erythema and scale on R breast-see skin exam    Musculoskeletal: She exhibits no edema.  Lymphadenopathy:    She has no cervical adenopathy.  Skin: Skin is warm and dry. Rash noted. There is erythema.  1 cm area of erythema with scale on R breast near nipple  No vesicle or drainage  No excoriation or signs of trauma No central clearing   Psychiatric: She has a normal mood and affect.          Assessment & Plan:   Problem List Items Addressed This Visit      Musculoskeletal and Integument   Dermatitis - Primary    Small area of erythema- round to oval and slt raised/scant scale on R breast resembles dermatitis Trial of mometasone cream If no imp in 2 wk will need derm ref and bx  No lump found  No hx of trauma or change in exposures

## 2016-06-27 NOTE — Progress Notes (Signed)
Pre visit review using our clinic review tool, if applicable. No additional management support is needed unless otherwise documented below in the visit note. 

## 2016-06-28 NOTE — Assessment & Plan Note (Signed)
Small area of erythema- round to oval and slt raised/scant scale on R breast resembles dermatitis Trial of mometasone cream If no imp in 2 wk will need derm ref and bx  No lump found  No hx of trauma or change in exposures

## 2016-07-10 ENCOUNTER — Telehealth: Payer: Self-pay | Admitting: *Deleted

## 2016-07-10 DIAGNOSIS — L309 Dermatitis, unspecified: Secondary | ICD-10-CM

## 2016-07-10 NOTE — Telephone Encounter (Signed)
PT was in today and wanted to let you know she did not see any improvement with the medication given for the spot on her breast. Pt uses [facility]. PT can be reached at [phone].

## 2016-07-10 NOTE — Telephone Encounter (Signed)
I will do a dermatology referral and route to [facility]

## 2016-11-23 ENCOUNTER — Telehealth: Payer: Self-pay | Admitting: Family Medicine

## 2016-11-23 DIAGNOSIS — R739 Hyperglycemia, unspecified: Secondary | ICD-10-CM

## 2016-11-23 DIAGNOSIS — Z0000 Encounter for general adult medical examination without abnormal findings: Secondary | ICD-10-CM

## 2016-11-23 NOTE — Telephone Encounter (Signed)
-----   Message from Ellamae Sia sent at 11/23/2016  3:44 PM EST ----- Regarding: lab orders for Friday, 12.15.17 Patient is scheduled for CPX labs, please order future labs, Thanks , Karna Christmas

## 2016-11-24 ENCOUNTER — Other Ambulatory Visit (INDEPENDENT_AMBULATORY_CARE_PROVIDER_SITE_OTHER): Payer: Medicare Other

## 2016-11-24 ENCOUNTER — Ambulatory Visit (INDEPENDENT_AMBULATORY_CARE_PROVIDER_SITE_OTHER): Payer: Medicare Other

## 2016-11-24 VITALS — BP 130/84 | HR 64 | Temp 98.4°F | Ht 64.0 in | Wt 211.8 lb

## 2016-11-24 DIAGNOSIS — Z0000 Encounter for general adult medical examination without abnormal findings: Secondary | ICD-10-CM | POA: Diagnosis not present

## 2016-11-24 DIAGNOSIS — R739 Hyperglycemia, unspecified: Secondary | ICD-10-CM

## 2016-11-24 DIAGNOSIS — Z1159 Encounter for screening for other viral diseases: Secondary | ICD-10-CM

## 2016-11-24 LAB — LIPID PANEL
Cholesterol: 152 mg/dL (ref 0–200)
HDL: 51.6 mg/dL (ref >39.00)
LDL Cholesterol: 72 mg/dL (ref 0–99)
NonHDL: 100.65
Total CHOL/HDL Ratio: 3
Triglycerides: 143 mg/dL (ref 0.0–149.0)
VLDL: 28.6 mg/dL (ref 0.0–40.0)

## 2016-11-24 LAB — CBC WITH DIFFERENTIAL/PLATELET
Basophils Absolute: 0 10*3/uL (ref 0.0–0.1)
Basophils Relative: 0.9 % (ref 0.0–3.0)
Eosinophils Absolute: 0.2 10*3/uL (ref 0.0–0.7)
Eosinophils Relative: 3.9 % (ref 0.0–5.0)
HCT: 44.6 % (ref 36.0–46.0)
Hemoglobin: 15.2 g/dL — ABNORMAL HIGH (ref 12.0–15.0)
Lymphocytes Relative: 28.4 % (ref 12.0–46.0)
Lymphs Abs: 1.1 10*3/uL (ref 0.7–4.0)
MCHC: 34.2 g/dL (ref 30.0–36.0)
MCV: 86.9 fl (ref 78.0–100.0)
Monocytes Absolute: 0.3 10*3/uL (ref 0.1–1.0)
Monocytes Relative: 6.9 % (ref 3.0–12.0)
Neutro Abs: 2.4 10*3/uL (ref 1.4–7.7)
Neutrophils Relative %: 59.9 % (ref 43.0–77.0)
Platelets: 151 10*3/uL (ref 150.0–400.0)
RBC: 5.13 Mil/uL — ABNORMAL HIGH (ref 3.87–5.11)
RDW: 13.2 % (ref 11.5–15.5)
WBC: 4 10*3/uL (ref 4.0–10.5)

## 2016-11-24 LAB — COMPREHENSIVE METABOLIC PANEL
ALT: 15 U/L (ref 0–35)
AST: 18 U/L (ref 0–37)
Albumin: 4.6 g/dL (ref 3.5–5.2)
Alkaline Phosphatase: 93 U/L (ref 39–117)
BUN: 18 mg/dL (ref 6–23)
CO2: 29 mEq/L (ref 19–32)
Calcium: 10.7 mg/dL — ABNORMAL HIGH (ref 8.4–10.5)
Chloride: 105 mEq/L (ref 96–112)
Creatinine, Ser: 1.02 mg/dL (ref 0.40–1.20)
GFR: 56.8 mL/min — ABNORMAL LOW (ref >60.00)
Glucose, Bld: 113 mg/dL — ABNORMAL HIGH (ref 70–99)
Potassium: 4.5 mEq/L (ref 3.5–5.1)
Sodium: 143 mEq/L (ref 135–145)
Total Bilirubin: 0.9 mg/dL (ref 0.2–1.2)
Total Protein: 7.2 g/dL (ref 6.0–8.3)

## 2016-11-24 LAB — HEPATITIS C ANTIBODY: HCV Ab: NEGATIVE

## 2016-11-24 LAB — HEMOGLOBIN A1C: Hgb A1c MFr Bld: 5.8 % (ref 4.6–6.5)

## 2016-11-24 LAB — TSH: TSH: 2.1 u[IU]/mL (ref 0.35–4.50)

## 2016-11-24 NOTE — Progress Notes (Signed)
Pre visit review using our clinic review tool, if applicable. No additional management support is needed unless otherwise documented below in the visit note. 

## 2016-11-24 NOTE — Progress Notes (Signed)
Subjective:   Natasha [name] is a 70 y.o. female who presents for Medicare Annual (Subsequent) preventive examination.  Review of Systems:  N/A Cardiac Risk Factors include: advanced age (>36men, >6 women);obesity (BMI >30kg/m2);dyslipidemia;hypertension;sedentary lifestyle     Objective:     Vitals: BP 130/84 (BP Location: Left Arm, Patient Position: Sitting, Cuff Size: Normal) Comment: no BP medications taken  Pulse 64   Temp 98.4 F (36.9 C) (Oral)   Ht 5\' 4"  (1.626 m) Comment: no shoes  Wt 211 lb 12 oz (96 kg)   LMP 12/11/1993   SpO2 98%   BMI 36.35 kg/m   Body mass index is 36.35 kg/m.   Tobacco History  Smoking Status  . Never Smoker  Smokeless Tobacco  . Not on file     Counseling given: No   Past Medical History:  Diagnosis Date  . CAD (coronary artery disease)    (4/05 carotid dopler- 39% stenosis) , (ECHO- mild LVH, EF 60%)  . CHF (congestive heart failure) ([place])   . DDD (degenerative disc disease), lumbar 11/2006   L5-S1 bulging disk  . Hyperlipidemia   . Hypertension   . Osteopenia   . Thrombocytopenia ([place])    mild   Past Surgical History:  Procedure Laterality Date  . BACK SURGERY  2004   fall at work  . BREAST BIOPSY Right    core- neg  . CHOLECYSTECTOMY    . SHOULDER SURGERY  2004   fall  . TUBAL LIGATION     Family History  Problem Relation Age of Onset  . Coronary artery disease Father   . Lung cancer Sister   . Breast cancer Maternal Grandmother   . Diabetes Sister   . Other Sister     OP  . Other Mother     has pacemaker   History  Sexual Activity  . Sexual activity: Not on file    Outpatient Encounter Prescriptions as of 11/24/2016  Medication Sig  . amLODipine (NORVASC) 5 MG tablet Take 5 mg by mouth daily.  Marland Kitchen aspirin 81 MG tablet Take 81 mg by mouth daily.    Marland Kitchen atorvastatin (LIPITOR) 20 MG tablet Take 20 mg by mouth daily.  . calcitonin, salmon, (MIACALCIN/FORTICAL) 200 UNIT/ACT nasal spray Place 1 spray into  alternate nostrils daily.  . Cholecalciferol (VITAMIN D-3) 1000 UNITS CAPS Take 2 tablets by mouth daily.   . pantoprazole (PROTONIX) 40 MG tablet Take 1 tablet (40 mg total) by mouth daily.  Marland Kitchen telmisartan-hydrochlorothiazide (MICARDIS HCT) 80-12.5 MG tablet Take 0.5 tablets by mouth daily.  . [DISCONTINUED] amLODipine-atorvastatin (CADUET) 5-20 MG tablet Take 1 tablet by mouth daily.  . [DISCONTINUED] losartan-hydrochlorothiazide (HYZAAR) 100-12.5 MG tablet Take 0.5 tablets by mouth daily.  . [DISCONTINUED] mometasone (ELOCON) 0.1 % cream Apply 1 application topically daily. To affected area   No facility-administered encounter medications on file as of 11/24/2016.     Activities of Daily Living In your present state of health, do you have any difficulty performing the following activities: 11/24/2016  Hearing? N  Vision? N  Difficulty concentrating or making decisions? N  Walking or climbing stairs? Y  Dressing or bathing? N  Doing errands, shopping? N  Preparing Food and eating ? N  Using the Toilet? N  In the past six months, have you accidently leaked urine? N  Do you have problems with loss of bowel control? N  Managing your Medications? N  Managing your Finances? N  Housekeeping or managing your Housekeeping?  N  Some recent data might be hidden    Patient Care Team: Abner Greenspan, MD as PCP - General Bryson Ha, OD as Consulting Physician (Optometry)    Assessment:     Hearing Screening   125Hz  250Hz  500Hz  1000Hz  2000Hz  3000Hz  4000Hz  6000Hz  8000Hz   Right ear:   40 40 40  40    Left ear:   40 40 40  40    Vision Screening Comments: Last vision exam with Dr. Kerin Ransom on 11/21/16   Exercise Activities and Dietary recommendations Current Exercise Habits: The patient does not participate in regular exercise at present, Exercise limited by: orthopedic condition(s)  Goals    . Increase physical activity          Starting 11/24/2016, I will attempt to do at least 15  min of chair exercises daily.       Fall Risk Fall Risk  11/24/2016 11/22/2015 11/17/2014 10/28/2013  Falls in the past year? No No No No   Depression Screen PHQ 2/9 Scores 11/24/2016 11/22/2015 11/17/2014 10/28/2013  PHQ - 2 Score 0 0 0 0     Cognitive Function MMSE - Mini Mental State Exam 11/24/2016  Orientation to time 5  Orientation to Place 5  Registration 3  Attention/ Calculation 0  Recall 3  Language- name 2 objects 0  Language- repeat 1  Language- follow 3 step command 3  Language- read & follow direction 0  Write a sentence 0  Copy design 0  Total score 20     PLEASE NOTE: A Mini-Cog screen was completed. Maximum score is 20. A value of 0 denotes this part of [place] MMSE was not completed or the patient failed this part of the Mini-Cog screening.   Mini-Cog Screening Orientation to Time - Max 5 pts Orientation to Place - Max 5 pts Registration - Max 3 pts Recall - Max 3 pts Language Repeat - Max 1 pts Language Follow 3 Step Command - Max 3 pts     Immunization History  Administered Date(s) Administered  . Influenza Split 09/24/2012  . Influenza, High Dose Seasonal PF 10/03/2016  . Influenza,inj,Quad PF,36+ Mos 10/28/2013, 10/14/2014  . Influenza-Unspecified 09/06/2015  . Pneumococcal Conjugate-13 11/17/2014  . Pneumococcal Polysaccharide-23 10/28/2013  . Td 12/11/2002  . Tdap 03/06/2014   Screening Tests Health Maintenance  Topic Date Due  . PAP SMEAR  11/24/2017 (Originally 04/18/2012)  . Fecal DNA (Cologuard)  11/24/2017 (Originally 01/18/1996)  . ZOSTAVAX  01/12/2021 (Originally 01/17/2006)  . COLON CANCER SCREENING ANNUAL FOBT  12/02/2016  . MAMMOGRAM  01/19/2017  . TETANUS/TDAP  03/06/2024  . INFLUENZA VACCINE  Completed  . DEXA SCAN  Completed  . Hepatitis C Screening  Completed  . PNA vac Low Risk Adult  Completed      Plan:     I have personally reviewed and addressed the Medicare Annual Wellness questionnaire and have noted the  following in the patient's chart:  A. Medical and social history B. Use of alcohol, tobacco or illicit drugs  C. Current medications and supplements D. Functional ability and status E.  Nutritional status F.  Physical activity G. Advance directives H. List of other physicians I.  Hospitalizations, surgeries, and ER visits in previous 12 months J.  [facility] to include hearing, vision, cognitive, depression L. Referrals and appointments - none  In addition, I have reviewed and discussed with patient certain preventive protocols, quality metrics, and best practice recommendations. A written personalized care plan for  preventive services as well as general preventive health recommendations were provided to patient.  See attached scanned questionnaire for additional information.   Signed,   Lindell Noe, MHA, BS, LPN Health Coach

## 2016-11-24 NOTE — Progress Notes (Signed)
PCP notes:   Health maintenance:  Fecal cologuard - will be ordered Pap Smear - pt will discuss screening with PCP Hep C screening - completed  Abnormal screenings:   None  Patient concerns:   None  Nurse concerns:  None  Next PCP appt:   12/01/16 @ 0930  I reviewed health advisor's note, was available for consultation, and agree with documentation and plan. Loura Pardon MD

## 2016-11-24 NOTE — Patient Instructions (Signed)
Ms. Carloni , Thank you for taking time to come for your Medicare Wellness Visit. I appreciate your ongoing commitment to your health goals. Please review the following plan we discussed and let me know if I can assist you in the future.   These are the goals we discussed: Goals    . Increase physical activity          Starting 11/24/2016, I will attempt to do at least 15 min of chair exercises daily.        This is a list of the screening recommended for you and due dates:  Health Maintenance  Topic Date Due  . Pap Smear  11/24/2017*  . Cologuard (Stool DNA test)  11/24/2017*  . Shingles Vaccine  01/12/2021*  . Stool Blood Test  12/02/2016  . Mammogram  01/19/2017  . Tetanus Vaccine  03/06/2024  . Flu Shot  Completed  . DEXA scan (bone density measurement)  Completed  .  Hepatitis C: One time screening is recommended by Center for Disease Control  (CDC) for  adults born from [birth year] through 1965.   Completed  . Pneumonia vaccines  Completed  *Topic was postponed. The date shown is not the original due date.   Preventive Care for Adults  A healthy lifestyle and preventive care can promote health and wellness. Preventive health guidelines for adults include the following key practices.  . A routine yearly physical is a good way to check with your health care provider about your health and preventive screening. It is a chance to share any concerns and updates on your health and to receive a thorough exam.  . Visit your dentist for a routine exam and preventive care every 6 months. Brush your teeth twice a day and floss once a day. Good oral hygiene prevents tooth decay and gum disease.  . The frequency of eye exams is based on your age, health, family medical history, use  of contact lenses, and other factors. Follow your health care provider's ecommendations for frequency of eye exams.  . Eat a healthy diet. Foods like vegetables, fruits, whole grains, low-fat dairy products, and  lean protein foods contain the nutrients you need without too many calories. Decrease your intake of foods high in solid fats, added sugars, and salt. Eat the right amount of calories for you. Get information about a proper diet from your health care provider, if necessary.  . Regular physical exercise is one of the most important things you can do for your health. Most adults should get at least 150 minutes of moderate-intensity exercise (any activity that increases your heart rate and causes you to sweat) each week. In addition, most adults need muscle-strengthening exercises on 2 or more days a week.  Silver Sneakers may be a benefit available to you. To determine eligibility, you may visit the website: www.silversneakers.com or contact program at (680) 275-7096 Mon-Fri between 8AM-8PM.   . Maintain a healthy weight. The body mass index (BMI) is a screening tool to identify possible weight problems. It provides an estimate of body fat based on height and weight. Your health care provider can find your BMI and can help you achieve or maintain a healthy weight.   For adults 20 years and older: ? A BMI below 18.5 is considered underweight. ? A BMI of 18.5 to 24.9 is normal. ? A BMI of 25 to 29.9 is considered overweight. ? A BMI of 30 and above is considered obese.   . Maintain normal blood lipids  and cholesterol levels by exercising and minimizing your intake of saturated fat. Eat a balanced diet with plenty of fruit and vegetables. Blood tests for lipids and cholesterol should begin at age 76 and be repeated every 5 years. If your lipid or cholesterol levels are high, you are over 50, or you are at high risk for heart disease, you may need your cholesterol levels checked more frequently. Ongoing high lipid and cholesterol levels should be treated with medicines if diet and exercise are not working.  . If you smoke, find out from your health care provider how to quit. If you do not use tobacco,  please do not start.  . If you choose to drink alcohol, please do not consume more than 2 drinks per day. One drink is considered to be 12 ounces (355 mL) of beer, 5 ounces (148 mL) of wine, or 1.5 ounces (44 mL) of liquor.  . If you are 24-72 years old, ask your health care provider if you should take aspirin to prevent strokes.  . Use sunscreen. Apply sunscreen liberally and repeatedly throughout the day. You should seek shade when your shadow is shorter than you. Protect yourself by wearing long sleeves, pants, a wide-brimmed hat, and sunglasses year round, whenever you are outdoors.  . Once a month, do a whole body skin exam, using a mirror to look at the skin on your back. Tell your health care provider of new moles, moles that have irregular borders, moles that are larger than a pencil eraser, or moles that have changed in shape or color.

## 2016-12-01 ENCOUNTER — Ambulatory Visit (INDEPENDENT_AMBULATORY_CARE_PROVIDER_SITE_OTHER): Payer: Medicare Other | Admitting: Family Medicine

## 2016-12-01 ENCOUNTER — Encounter: Payer: Self-pay | Admitting: Family Medicine

## 2016-12-01 VITALS — BP 128/80 | HR 72 | Temp 98.5°F | Ht 64.0 in | Wt 214.8 lb

## 2016-12-01 DIAGNOSIS — Z1211 Encounter for screening for malignant neoplasm of colon: Secondary | ICD-10-CM

## 2016-12-01 DIAGNOSIS — Z0000 Encounter for general adult medical examination without abnormal findings: Secondary | ICD-10-CM

## 2016-12-01 DIAGNOSIS — E7800 Pure hypercholesterolemia, unspecified: Secondary | ICD-10-CM

## 2016-12-01 DIAGNOSIS — Z6836 Body mass index (BMI) 36.0-36.9, adult: Secondary | ICD-10-CM

## 2016-12-01 DIAGNOSIS — E2839 Other primary ovarian failure: Secondary | ICD-10-CM

## 2016-12-01 DIAGNOSIS — R739 Hyperglycemia, unspecified: Secondary | ICD-10-CM

## 2016-12-01 DIAGNOSIS — M8589 Other specified disorders of bone density and structure, multiple sites: Secondary | ICD-10-CM

## 2016-12-01 DIAGNOSIS — I10 Essential (primary) hypertension: Secondary | ICD-10-CM | POA: Diagnosis not present

## 2016-12-01 DIAGNOSIS — IMO0001 Reserved for inherently not codable concepts without codable children: Secondary | ICD-10-CM

## 2016-12-01 DIAGNOSIS — E6609 Other obesity due to excess calories: Secondary | ICD-10-CM

## 2016-12-01 MED ORDER — CALCITONIN (SALMON) 200 UNIT/ACT NA SOLN: 1.0000 | Freq: Every day | NASAL | 11 refills | Status: DC

## 2016-12-01 MED ORDER — PANTOPRAZOLE SODIUM 40 MG PO TBEC: 40.0000 mg | DELAYED_RELEASE_TABLET | Freq: Every day | ORAL | 3 refills | Status: DC

## 2016-12-01 NOTE — Assessment & Plan Note (Signed)
Lab Results  Component Value Date   HGBA1C 5.8 11/24/2016   This is stable Disc low glycemic diet and exercise and wt loss to prevent DM2

## 2016-12-01 NOTE — Assessment & Plan Note (Signed)
Disc goals for lipids and reasons to control them Rev labs with pt Rev low sat fat diet in detail  Well controlled with atorvastatin and diet  

## 2016-12-01 NOTE — Progress Notes (Signed)
Subjective:    Patient ID: Natasha [name], female    DOB: 1946/07/08, 70 y.o.   MRN: [id number]  HPI Here for health maintenance exam and to review chronic medical problems    Just lost her brother on Thursday  86 with alz   Had AMW 12/15  cologuard kit was ordered - has not rec it yet   Hep C screening done -neg   Wt Readings from Last 3 Encounters:  12/01/16 214 lb 12 oz (97.4 kg)  11/24/16 211 lb 12 oz (96 kg)  06/27/16 218 lb 8 oz (99.1 kg)  down between 4-5 lb since summer and up a few this month  bmi 36.8 Exercise- none- due to hip problems  Was enc to do chair exercise  Is very active  She does her own housekeeping - except for mopping     Pap 5/12 No gyn symptoms   Zoster vaccine - still not covered by insurance   ifob 12/16-nl   Mammogram 2/17-nl (nl bx in the past)  Self breast exam -no lumps   dexa 2/16-osteopenia mixed trend  On miacalcin - still using  Still taking ca and D  No falls or fractures  She wants to schedule her dexa for Feb   Hx of hyperlipidemia Lab Results  Component Value Date   CHOL 152 11/24/2016   CHOL 148 11/15/2015   CHOL 148 11/17/2014   Lab Results  Component Value Date   HDL 51.60 11/24/2016   HDL 44.20 11/15/2015   HDL 46.60 11/17/2014   Lab Results  Component Value Date   LDLCALC 72 11/24/2016   LDLCALC 76 11/15/2015   LDLCALC 76 11/17/2014   Lab Results  Component Value Date   TRIG 143.0 11/24/2016   TRIG 136.0 11/15/2015   TRIG 128.0 11/17/2014   Lab Results  Component Value Date   CHOLHDL 3 11/24/2016   CHOLHDL 3 11/15/2015   CHOLHDL 3 11/17/2014   No results found for: LDLDIRECT Atorvastatin 20 and diet  Does eat fairly healthy   Hx of hyperglycemia Lab Results  Component Value Date   HGBA1C 5.8 11/24/2016   Same as last check Wants to avoid diabetes  Is obese  Works on wt loss  She does like sweets but has cut back    bp is stable today  No cp or palpitations or headaches or edema    No side effects to medicines  BP Readings from Last 3 Encounters:  12/01/16 128/80  11/24/16 130/84  06/27/16 132/80    Hx of CAD  Hx of mild thrombocytopenia Lab Results  Component Value Date   WBC 4.0 11/24/2016   HGB 15.2 (H) 11/24/2016   HCT 44.6 11/24/2016   MCV 86.9 11/24/2016   PLT 151.0 11/24/2016     Results for orders placed or performed in visit on 11/24/16  Hepatitis C antibody  Result Value Ref Range   HCV Ab NEGATIVE NEGATIVE      Chemistry      Component Value Date/Time   NA 143 11/24/2016 0912   NA 140 07/11/2014 0942   NA 142 07/08/2009 0822   K 4.5 11/24/2016 0912   K 4.2 07/11/2014 0942   K 4.4 07/08/2009 0822   CL 105 11/24/2016 0912   CL 106 07/11/2014 0942   CL 104 07/08/2009 0822   CO2 29 11/24/2016 0912   CO2 28 07/11/2014 0942   CO2 31 07/08/2009 0822   BUN 18 11/24/2016 0912   BUN  13 07/11/2014 0942   BUN 16 07/08/2009 0822   CREATININE 1.02 11/24/2016 0912   CREATININE 0.94 07/11/2014 0942   CREATININE 0.9 07/08/2009 0822      Component Value Date/Time   CALCIUM 10.7 (H) 11/24/2016 0912   CALCIUM 9.8 07/11/2014 0942   CALCIUM 9.5 07/08/2009 0822   ALKPHOS 93 11/24/2016 0912   ALKPHOS 94 07/11/2014 0942   ALKPHOS 69 07/08/2009 0822   AST 18 11/24/2016 0912   AST 31 07/11/2014 0942   AST 26 07/08/2009 0822   ALT 15 11/24/2016 0912   ALT 24 07/11/2014 0942   ALT 21 07/08/2009 0822   BILITOT 0.9 11/24/2016 0912   BILITOT 0.8 07/11/2014 0942   BILITOT 0.80 07/08/2009 0822       Lab Results  Component Value Date   TSH 2.10 11/24/2016     Patient Active Problem List   Diagnosis Date Noted  . Dermatitis 06/27/2016  . Routine general medical examination at a health care facility 11/22/2015  . Colon cancer screening 11/22/2015  . Special screening for malignant neoplasms, colon 11/17/2014  . Estrogen deficiency 11/17/2014  . Encounter for Medicare annual wellness exam 10/28/2013  . Hyperglycemia 10/28/2013  . Obesity  10/28/2013  . Post-menopausal 09/24/2012  . Allergic rhinitis 04/26/2012  . Gynecologic exam normal 04/19/2011  . Osteopenia 11/10/2008  . THROMBOCYTOPENIA 08/11/2008  . POSTMENOPAUSAL STATUS 07/09/2008  . HYPERCHOLESTEROLEMIA 07/16/2007  . Essential hypertension 07/16/2007  . CORONARY ARTERY DISEASE 07/16/2007  . CONGESTIVE HEART FAILURE 07/16/2007  . DEGENERATIVE DISC DISEASE, LUMBAR SPINE 07/16/2007   Past Medical History:  Diagnosis Date  . CAD (coronary artery disease)    (4/05 carotid dopler- 39% stenosis) , (ECHO- mild LVH, EF 60%)  . CHF (congestive heart failure) ([place])   . DDD (degenerative disc disease), lumbar 11/2006   L5-S1 bulging disk  . Hyperlipidemia   . Hypertension   . Osteopenia   . Thrombocytopenia ([place])    mild   Past Surgical History:  Procedure Laterality Date  . BACK SURGERY  2004   fall at work  . BREAST BIOPSY Right    core- neg  . CHOLECYSTECTOMY    . SHOULDER SURGERY  2004   fall  . TUBAL LIGATION     Social History  Substance Use Topics  . Smoking status: Never Smoker  . Smokeless tobacco: Not on file  . Alcohol use No   Family History  Problem Relation Age of Onset  . Coronary artery disease Father   . Lung cancer Sister   . Breast cancer Maternal Grandmother   . Diabetes Sister   . Other Sister     OP  . Other Mother     has pacemaker   Allergies  Allergen Reactions  . Naproxen Sodium     REACTION: stomach pain  . Clindamycin/Lincomycin Itching and Rash   Current Outpatient Prescriptions on File Prior to Visit  Medication Sig Dispense Refill  . amLODipine (NORVASC) 5 MG tablet Take 5 mg by mouth daily.    Marland Kitchen aspirin 81 MG tablet Take 81 mg by mouth daily.      Marland Kitchen atorvastatin (LIPITOR) 20 MG tablet Take 20 mg by mouth daily.    . Cholecalciferol (VITAMIN D-3) 1000 UNITS CAPS Take 2 tablets by mouth daily.     Marland Kitchen telmisartan-hydrochlorothiazide (MICARDIS HCT) 80-12.5 MG tablet Take 0.5 tablets by mouth daily.     No  current facility-administered medications on file prior to visit.     Review  of Systems Review of Systems  Constitutional: Negative for fever, appetite change, fatigue and unexpected weight change.  Eyes: Negative for pain and visual disturbance.  Respiratory: Negative for cough and shortness of breath.   Cardiovascular: Negative for cp or palpitations    Gastrointestinal: Negative for nausea, diarrhea and constipation.  Genitourinary: Negative for urgency and frequency.  Skin: Negative for pallor or rash   MSK pos for chronic hip pain that limits activities  Neurological: Negative for weakness, light-headedness, numbness and headaches.  Hematological: Negative for adenopathy. Does not bruise/bleed easily.  Psychiatric/Behavioral: Negative for dysphoric mood. The patient is not nervous/anxious.         Objective:   Physical Exam  Constitutional: She appears well-developed and well-nourished. No distress.  obese and well appearing   HENT:  Head: Normocephalic and atraumatic.  Right Ear: External ear normal.  Left Ear: External ear normal.  Mouth/Throat: Oropharynx is clear and moist.  Eyes: Conjunctivae and EOM are normal. Pupils are equal, round, and reactive to light. No scleral icterus.  Neck: Normal range of motion. Neck supple. No JVD present. Carotid bruit is not present. No thyromegaly present.  Cardiovascular: Normal rate, regular rhythm, normal heart sounds and intact distal pulses.  Exam reveals no gallop.   Pulmonary/Chest: Effort normal and breath sounds normal. No respiratory distress. She has no wheezes. She exhibits no tenderness.  Abdominal: Soft. Bowel sounds are normal. She exhibits no distension, no abdominal bruit and no mass. There is no tenderness.  Genitourinary: No breast swelling, tenderness, discharge or bleeding.  Genitourinary Comments: Breast exam: No mass, nodules, thickening, tenderness, bulging, retraction, inflamation, nipple discharge or skin changes  noted.  No axillary or clavicular LA.      Musculoskeletal: Normal range of motion. She exhibits no edema or tenderness.  Lymphadenopathy:    She has no cervical adenopathy.  Neurological: She is alert. She has normal reflexes. No cranial nerve deficit. She exhibits normal muscle tone. Coordination normal.  Skin: Skin is warm and dry. No rash noted. No erythema. No pallor.  Solar lentigines diffusely on arms Few SK on trunk  Psychiatric: She has a normal mood and affect.          Assessment & Plan:   Problem List Items Addressed This Visit      Cardiovascular and Mediastinum   Essential hypertension - Primary    bp in fair control at this time  BP Readings from Last 1 Encounters:  12/01/16 128/80   No changes needed Disc lifstyle change with low sodium diet and exercise  Labs reviewed  Wt loss enc        Musculoskeletal and Integument   Osteopenia    Due for 2 y dexa in 2/18  Will work on scheduling that No falls or fx Continue miacalcin and exercise and ca and D        Other   Special screening for malignant neoplasms, colon    cologuard was [facility] at [facility]      Routine general medical examination at a health care facility    Reviewed health habits including diet and exercise and skin cancer prevention Reviewed appropriate screening tests for age  Also reviewed health mt list, fam hx and immunization status , as well as social and family history   See HPI Labs rev  AMW rev  Enc exercise including chair exercise  Enc wt loss  Enc low glycemic diet to prevent DM (and imp of protein with every meal cologuard was prev  ordered She will schedule her mammogram for feb (reminder given)  We will ref for dexa 2/18       Obesity    Discussed how this problem influences overall health and the risks it imposes  Reviewed plan for weight loss with lower calorie diet (via better food choices and also portion control or program like weight watchers) and exercise  building up to or more than 30 minutes 5 days per week including some aerobic activity   Enc to avoid processed carbs  Enc to try some chair exercise for upper body      Hyperglycemia    Lab Results  Component Value Date   HGBA1C 5.8 11/24/2016   This is stable Disc low glycemic diet and exercise and wt loss to prevent DM2      HYPERCHOLESTEROLEMIA    Disc goals for lipids and reasons to control them Rev labs with pt Rev low sat fat diet in detail Well controlled with atorvastatin and diet       Estrogen deficiency   Relevant Orders   DG Bone Density   Colon cancer screening    cologaurd was ordered with amw

## 2016-12-01 NOTE — Assessment & Plan Note (Signed)
cologaurd was ordered with amw

## 2016-12-01 NOTE — Assessment & Plan Note (Signed)
bp in fair control at this time  BP Readings from Last 1 Encounters:  12/01/16 128/80   No changes needed Disc lifstyle change with low sodium diet and exercise  Labs reviewed  Wt loss enc

## 2016-12-01 NOTE — Assessment & Plan Note (Signed)
Discussed how this problem influences overall health and the risks it imposes  Reviewed plan for weight loss with lower calorie diet (via better food choices and also portion control or program like weight watchers) and exercise building up to or more than 30 minutes 5 days per week including some aerobic activity   Enc to avoid processed carbs  Enc to try some chair exercise for upper body

## 2016-12-01 NOTE — Assessment & Plan Note (Signed)
Due for 2 y dexa in 2/18  Will work on scheduling that No falls or fx Continue miacalcin and exercise and ca and D

## 2016-12-01 NOTE — Patient Instructions (Addendum)
Don't forget to schedule your mammogram in February at [place]  Stop at check out for referral for bone density test  To prevent diabetes- avoid sweets/ sugar drinks and refined carbohydrates - and exercise  Weight loss helps  Protein is important - meat/fish/dairy/nuts/dried beans/soy/eggs   Add a little chair exercise for arm strength

## 2016-12-01 NOTE — Assessment & Plan Note (Signed)
Reviewed health habits including diet and exercise and skin cancer prevention Reviewed appropriate screening tests for age  Also reviewed health mt list, fam hx and immunization status , as well as social and family history   See HPI Labs rev  AMW rev  Enc exercise including chair exercise  Enc wt loss  Enc low glycemic diet to prevent DM (and imp of protein with every meal cologuard was prev ordered She will schedule her mammogram for feb (reminder given)  We will ref for dexa 2/18

## 2016-12-01 NOTE — Progress Notes (Signed)
Pre visit review using our clinic review tool, if applicable. No additional management support is needed unless otherwise documented below in the visit note. 

## 2016-12-01 NOTE — Assessment & Plan Note (Signed)
cologuard was ordered at [facility]

## 2016-12-06 ENCOUNTER — Other Ambulatory Visit: Payer: Self-pay | Admitting: Family Medicine

## 2016-12-07 ENCOUNTER — Other Ambulatory Visit: Payer: Self-pay | Admitting: Family Medicine

## 2016-12-07 DIAGNOSIS — Z1231 Encounter for screening mammogram for malignant neoplasm of breast: Secondary | ICD-10-CM

## 2016-12-26 LAB — COLOGUARD: Cologuard: NEGATIVE

## 2017-01-03 ENCOUNTER — Encounter: Payer: Self-pay | Admitting: *Deleted

## 2017-01-22 ENCOUNTER — Ambulatory Visit
Admission: RE | Admit: 2017-01-22 | Discharge: 2017-01-22 | Disposition: A | Discharge status: Home / Self Care | Payer: Medicare Other | Source: Ambulatory Visit | Attending: Family Medicine | Admitting: Family Medicine

## 2017-01-22 DIAGNOSIS — M8588 Other specified disorders of bone density and structure, other site: Secondary | ICD-10-CM | POA: Diagnosis not present

## 2017-01-22 DIAGNOSIS — Z1231 Encounter for screening mammogram for malignant neoplasm of breast: Secondary | ICD-10-CM | POA: Diagnosis not present

## 2017-01-22 DIAGNOSIS — Z780 Asymptomatic menopausal state: Secondary | ICD-10-CM | POA: Insufficient documentation

## 2017-01-22 DIAGNOSIS — E2839 Other primary ovarian failure: Secondary | ICD-10-CM | POA: Diagnosis not present

## 2017-04-11 ENCOUNTER — Ambulatory Visit (INDEPENDENT_AMBULATORY_CARE_PROVIDER_SITE_OTHER): Payer: Medicare Other | Admitting: Family Medicine

## 2017-04-11 VITALS — BP 132/80 | HR 70 | Temp 98.3°F | Ht 64.0 in | Wt 217.8 lb

## 2017-04-11 DIAGNOSIS — R519 Headache, unspecified: Secondary | ICD-10-CM

## 2017-04-11 DIAGNOSIS — R51 Headache: Secondary | ICD-10-CM | POA: Diagnosis not present

## 2017-04-11 NOTE — Patient Instructions (Signed)
Labs today to look for an inflammatory condition Also a blood count (your white count will be high from the prednisone)- I expect that  Possible cause could be a neuralgia of a facial nerve - a neurologist can help with that   We will make a plan when labs come back If warm or cool compress helps when pain starts- that is ok to try

## 2017-04-11 NOTE — Progress Notes (Signed)
Pre visit review using our clinic review tool, if applicable. No additional management support is needed unless otherwise documented below in the visit note. 

## 2017-04-11 NOTE — Progress Notes (Signed)
Subjective:    Patient ID: Natasha [name], female    DOB: 06/01/1946, 71 y.o.   MRN: [id number]  HPI Here for a problem under her L ear -pain  Happens sporadically  Sometimes it is severe - spreading to her whole ear  Tender  A puffy place  6 months-getting worse  No redness/heat or rash  No trauma  Does not feel like an ear infection  Felt a little dizzy this week  No nasal or throat symptoms    She is on prednisone for back pain Helped her back but not the facial/ear pain   BP Readings from Last 3 Encounters:  04/11/17 132/80  12/01/16 128/80  11/24/16 130/84   Patient Active Problem List   Diagnosis Date Noted  . Facial pain 04/11/2017  . Temple tenderness 04/11/2017  . Dermatitis 06/27/2016  . Routine general medical examination at a health care facility 11/22/2015  . Colon cancer screening 11/22/2015  . Special screening for malignant neoplasms, colon 11/17/2014  . Estrogen deficiency 11/17/2014  . Encounter for Medicare annual wellness exam 10/28/2013  . Hyperglycemia 10/28/2013  . Obesity 10/28/2013  . Post-menopausal 09/24/2012  . Allergic rhinitis 04/26/2012  . Gynecologic exam normal 04/19/2011  . Osteopenia 11/10/2008  . THROMBOCYTOPENIA 08/11/2008  . POSTMENOPAUSAL STATUS 07/09/2008  . HYPERCHOLESTEROLEMIA 07/16/2007  . Essential hypertension 07/16/2007  . CORONARY ARTERY DISEASE 07/16/2007  . CONGESTIVE HEART FAILURE 07/16/2007  . DEGENERATIVE DISC DISEASE, LUMBAR SPINE 07/16/2007   Past Medical History:  Diagnosis Date  . CAD (coronary artery disease)    (4/05 carotid dopler- 39% stenosis) , (ECHO- mild LVH, EF 60%)  . CHF (congestive heart failure) ([place])   . DDD (degenerative disc disease), lumbar 11/2006   L5-S1 bulging disk  . Hyperlipidemia   . Hypertension   . Osteopenia   . Thrombocytopenia ([place])    mild   Past Surgical History:  Procedure Laterality Date  . BACK SURGERY  2004   fall at work  . BREAST BIOPSY Right 2017   core- neg  . CHOLECYSTECTOMY    . SHOULDER SURGERY  2004   fall  . TUBAL LIGATION     Social History  Substance Use Topics  . Smoking status: Never Smoker  . Smokeless tobacco: Not on file  . Alcohol use No   Family History  Problem Relation Age of Onset  . Coronary artery disease Father   . Lung cancer Sister   . Breast cancer Maternal Grandmother   . Diabetes Sister   . Other Sister     OP  . Other Mother     has pacemaker   Allergies  Allergen Reactions  . Naproxen Sodium     REACTION: stomach pain  . Clindamycin/Lincomycin Itching and Rash   Current Outpatient Prescriptions on File Prior to Visit  Medication Sig Dispense Refill  . amLODipine (NORVASC) 5 MG tablet TAKE 1 TABLET EVERY DAY 30 tablet 11  . aspirin 81 MG tablet Take 81 mg by mouth daily.      Marland Kitchen atorvastatin (LIPITOR) 20 MG tablet TAKE 1 TABLET EVERY DAY 30 tablet 11  . calcitonin, salmon, (MIACALCIN/FORTICAL) 200 UNIT/ACT nasal spray Place 1 spray into alternate nostrils daily. 3.7 mL 11  . Cholecalciferol (VITAMIN D-3) 1000 UNITS CAPS Take 2 tablets by mouth daily.     Marland Kitchen losartan-hydrochlorothiazide (HYZAAR) 100-12.5 MG tablet TAKE 1/2 TABLET EVERYDAY 45 tablet 3  . pantoprazole (PROTONIX) 40 MG tablet Take 1 tablet (40 mg total) by mouth  daily. 90 tablet 3  . telmisartan-hydrochlorothiazide (MICARDIS HCT) 80-12.5 MG tablet Take 0.5 tablets by mouth daily.     No current facility-administered medications on file prior to visit.      Review of Systems    Review of Systems  Constitutional: Negative for fever, appetite change, fatigue and unexpected weight change.  Eyes: Negative for pain and visual disturbance.  Pos for fleeting ear pain / neg for diff hearing  Respiratory: Negative for cough and shortness of breath.   Cardiovascular: Negative for cp or palpitations    Gastrointestinal: Negative for nausea, diarrhea and constipation.  Genitourinary: Negative for urgency and frequency.  Skin:  Negative for pallor or rash  Neg for swelling  MSK pos for chronic back pain  Neurological: Negative for weakness, light-headedness, numbness and headaches. pos for fleeting L sided facial pain  Hematological: Negative for adenopathy. Does not bruise/bleed easily.  Psychiatric/Behavioral: Negative for dysphoric mood. The patient is not nervous/anxious.      Objective:   Physical Exam  Constitutional: She appears well-developed and well-nourished. No distress.  obese and well appearing  Pain is not occurring at the moment  HENT:  Head: Normocephalic and atraumatic.  Right Ear: External ear normal.  Left Ear: External ear normal.  Nose: Nose normal.  Mouth/Throat: Oropharynx is clear and moist. No oropharyngeal exudate.  Very slight temporal tenderness/ no other tenderness No M or adenopathy No swelling of jaw or ear on L No dental abn seen on exam No TMJ tenderness  Eyes: Conjunctivae and EOM are normal. Pupils are equal, round, and reactive to light. Right eye exhibits no discharge. Left eye exhibits no discharge. No scleral icterus.  Neck: Normal range of motion. Neck supple. No JVD present. Carotid bruit is not present. No thyromegaly present.  Cardiovascular: Normal rate and regular rhythm.  Exam reveals no gallop.   No murmur heard. Pulmonary/Chest: Effort normal and breath sounds normal. No respiratory distress. She has no wheezes. She has no rales.  Musculoskeletal: She exhibits no edema, tenderness or deformity.  Nl rom neck  Lymphadenopathy:    She has no cervical adenopathy.  Neurological: She is alert. She has normal strength and normal reflexes. She displays no atrophy. No cranial nerve deficit or sensory deficit. She exhibits normal muscle tone. Coordination and gait normal.  Skin: No rash noted. No erythema. No pallor.  Psychiatric: She has a normal mood and affect.          Assessment & Plan:   Problem List Items Addressed This Visit      Other   Facial  pain    Starting at angle of jaw and extending up over side of face- to the temple occ worse with chewing (no TMJ tenderness today) No M or skin changes-reassuring exam Check lab for TA Also cbc  ? 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

## 2017-04-12 LAB — CBC WITH DIFFERENTIAL/PLATELET
Basophils Absolute: 0 10*3/uL (ref 0.0–0.1)
Basophils Relative: 0.3 % (ref 0.0–3.0)
Eosinophils Absolute: 0 10*3/uL (ref 0.0–0.7)
Eosinophils Relative: 0.1 % (ref 0.0–5.0)
HCT: 45.4 % (ref 36.0–46.0)
Hemoglobin: 15.3 g/dL — ABNORMAL HIGH (ref 12.0–15.0)
Lymphocytes Relative: 10.4 % — ABNORMAL LOW (ref 12.0–46.0)
Lymphs Abs: 0.7 10*3/uL (ref 0.7–4.0)
MCHC: 33.7 g/dL (ref 30.0–36.0)
MCV: 88.3 fl (ref 78.0–100.0)
Monocytes Absolute: 0.2 10*3/uL (ref 0.1–1.0)
Monocytes Relative: 2.3 % — ABNORMAL LOW (ref 3.0–12.0)
Neutro Abs: 6.2 10*3/uL (ref 1.4–7.7)
Neutrophils Relative %: 86.9 % — ABNORMAL HIGH (ref 43.0–77.0)
Platelets: 152 10*3/uL (ref 150.0–400.0)
RBC: 5.14 Mil/uL — ABNORMAL HIGH (ref 3.87–5.11)
RDW: 13.6 % (ref 11.5–15.5)
WBC: 7.1 10*3/uL (ref 4.0–10.5)

## 2017-04-12 LAB — SEDIMENTATION RATE: Sed Rate: 5 mm/hr (ref 0–30)

## 2017-04-12 LAB — C-REACTIVE PROTEIN: CRP: <0.1 mg/dL — ABNORMAL LOW (ref 0.5–20.0)

## 2017-04-12 NOTE — Assessment & Plan Note (Signed)
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

## 2017-04-12 NOTE — Assessment & Plan Note (Signed)
Starting at angle of jaw and extending up over side of face- to the temple occ worse with chewing (no TMJ tenderness today) No M or skin changes-reassuring exam Check lab for TA Also cbc  ? If this may be a facial nerve neuralgia  Pending labs /consider neuro ref

## 2017-04-13 ENCOUNTER — Telehealth: Payer: Self-pay | Admitting: Family Medicine

## 2017-04-13 DIAGNOSIS — R51 Headache: Principal | ICD-10-CM

## 2017-04-13 DIAGNOSIS — R519 Headache, unspecified: Secondary | ICD-10-CM

## 2017-04-13 NOTE — Telephone Encounter (Signed)
Ref done  Will route to [facility] 

## 2017-04-13 NOTE — Telephone Encounter (Signed)
-----   Message from Tammi Sou, [place] sent at 04/12/2017  4:51 PM EDT ----- Pt notified of lab results and Dr. Marliss Coots recommendations. Pt does agree with referral, I advise pt our [facility] will call to schedule appt., please put referral in

## 2017-04-30 ENCOUNTER — Ambulatory Visit (INDEPENDENT_AMBULATORY_CARE_PROVIDER_SITE_OTHER): Payer: Medicare Other | Admitting: Neurology

## 2017-04-30 ENCOUNTER — Encounter: Payer: Self-pay | Admitting: Neurology

## 2017-04-30 DIAGNOSIS — G4489 Other headache syndrome: Secondary | ICD-10-CM

## 2017-04-30 HISTORY — DX: Other headache syndrome: G44.89

## 2017-04-30 MED ORDER — GABAPENTIN 300 MG PO CAPS: 300.0000 mg | ORAL_CAPSULE | Freq: Every day | ORAL | 3 refills | Status: DC

## 2017-04-30 NOTE — Patient Instructions (Signed)
   We will get MRI of the brain and get started on gabapentin at night.  Neurontin (gabapentin) may result in drowsiness, ankle swelling, gait instability, or possibly dizziness. Please contact our office if significant side effects occur with this medication.

## 2017-04-30 NOTE — Progress Notes (Signed)
Reason for visit: Headache  Referring physician: Dr. Janann August is a 71 y.o. female  History of present illness:  Ms. Devivo is a 71 year old right-handed white female with a history of onset of headaches that began in October 2017. The patient had spontaneous onset of the headache that began as an "explosion" on the left side of the head affecting mainly the ear and going down the neck on the left side. The episodes initially were relatively infrequent occurring once every 2 or 3 weeks. In the month of May, the episodes have become more frequent, and are occurring several times a week and may occur several times at night. All events have occurred coming out of sleep, she has never had any episodes during the daytime. The patient has discomfort around the left ear, she has some neck stiffness at times. The patient has not been placed on any medications for the headache. She has undergone blood work that has included a C-reactive protein and sedimentation rate which were unremarkable. She denies any numbness or weakness of the extremities, she has not had any balance changes or difficulty controlling the bowels or the bladder. She denies any blackout episodes, dizziness, or confusion. She has not had any visual complaints or nausea or vomiting with the headache. She is sent to this office for further evaluation.   Past Medical History:  Diagnosis Date  . CAD (coronary artery disease)    (4/05 carotid dopler- 39% stenosis) , (ECHO- mild LVH, EF 60%)  . CHF (congestive heart failure) ([place])   . DDD (degenerative disc disease), lumbar 11/2006   L5-S1 bulging disk  . Hyperlipidemia   . Hypertension   . Osteopenia   . Thrombocytopenia ([place])    mild    Past Surgical History:  Procedure Laterality Date  . BACK SURGERY  2004   fall at work  . BREAST BIOPSY Right 2017   core- neg  . CHOLECYSTECTOMY    . SHOULDER SURGERY  2004   fall  . TUBAL LIGATION      Family History    Problem Relation Age of Onset  . Coronary artery disease Father   . Lung cancer Sister   . Breast cancer Maternal Grandmother   . Diabetes Sister   . Other Sister        OP  . Other Mother        has pacemaker    Social history:  reports that she has never smoked. She has never used smokeless tobacco. She reports that she does not drink alcohol or use drugs.  Medications:  Prior to Admission medications   Medication Sig Start Date End Date Taking? Authorizing Provider  amLODipine (NORVASC) 5 MG tablet TAKE 1 TABLET EVERY DAY 12/06/16  Yes Tower, Wynelle Fanny, MD  aspirin 81 MG tablet Take 81 mg by mouth daily.     Yes [provider]  atorvastatin (LIPITOR) 20 MG tablet TAKE 1 TABLET EVERY DAY 12/06/16  Yes Tower, Marne A, MD  calcitonin, salmon, (MIACALCIN/FORTICAL) 200 UNIT/ACT nasal spray Place 1 spray into alternate nostrils daily. 12/01/16  Yes Tower, Wynelle Fanny, MD  Cholecalciferol (VITAMIN D-3) 1000 UNITS CAPS Take 2 tablets by mouth daily.    Yes [provider]  losartan-hydrochlorothiazide (HYZAAR) 100-12.5 MG tablet TAKE 1/2 TABLET EVERYDAY 12/06/16  Yes Tower, Marne A, MD  pantoprazole (PROTONIX) 40 MG tablet Take 1 tablet (40 mg total) by mouth daily. 12/01/16  Yes Tower, Wynelle Fanny, MD  Allergies  Allergen Reactions  . Naproxen Sodium     REACTION: stomach pain  . Clindamycin/Lincomycin Itching and Rash    ROS:  Out of a complete 14 system review of symptoms, the patient complains only of the following symptoms, and all other reviewed systems are negative.  Headache  Blood pressure (!) 141/75, pulse 70, height 5\' 4"  (1.626 m), weight 220 lb (99.8 kg), last menstrual period 12/11/1993.  Physical Exam  General: The patient is alert and cooperative at the time of the examination.  Eyes: Pupils are equal, round, and reactive to light. Discs are flat bilaterally.  Ears: Tympanic membranes are clear bilaterally.  Neck: The neck is supple, no  carotid bruits are noted.  Respiratory: The respiratory examination is clear.  Cardiovascular: The cardiovascular examination reveals a regular rate and rhythm, no obvious murmurs or rubs are noted.  Neuromuscular: The patient has full range of movement of the cervical spine when turning her head to the right, she lacks about 15 when turning to the left.  Skin: Extremities are without significant edema.  Neurologic Exam  Mental status: The patient is alert and oriented x 3 at the time of the examination. The patient has apparent normal recent and remote memory, with an apparently normal attention span and concentration ability.  Cranial nerves: Facial symmetry is present. There is good sensation of the face to pinprick and soft touch on the right, slight decrease in pinprick sensation on the corner of the jaw on the left and the left neck. The strength of the facial muscles and the muscles to head turning and shoulder shrug are normal bilaterally. Speech is well enunciated, no aphasia or dysarthria is noted. Extraocular movements are full. Visual fields are full. The tongue is midline, and the patient has symmetric elevation of the soft palate. No obvious hearing deficits are noted.  Motor: The motor testing reveals 5 over 5 strength of all 4 extremities. Good symmetric motor tone is noted throughout.  Sensory: Sensory testing is intact to pinprick, soft touch, vibration sensation, and position sense on all 4 extremities. No evidence of extinction is noted.  Coordination: Cerebellar testing reveals good finger-nose-finger and heel-to-shin bilaterally.  Gait and station: Gait is normal. Tandem gait is normal. Romberg is negative. No drift is seen.  Reflexes: Deep tendon reflexes are symmetric and normal bilaterally. Toes are downgoing bilaterally.   Assessment/Plan:  1. Left ear, neck discomfort  The patient reports sharp headache pains that come on suddenly and last only a few seconds  to a minute. The patient is having multiple episodes at night. She denies any tongue numbness, but she does report some slight sensory alteration on the left neck and left lower jaw. The patient may have a neuralgia affecting the C2 or C3 nerve roots, or she could potentially have a lesser branch occipital neuralgia. The patient will be placed on low-dose gabapentin taking 300 mg at night. The patient will be set up for MRI of the brain with and without gadolinium enhancement. She will follow-up in 3 months.  Jill Alexanders MD 04/30/2017 9:39 AM  [facility] [address] [place], [place] [address]  Phone [phone] Fax [phone]

## 2017-05-16 ENCOUNTER — Ambulatory Visit
Admission: RE | Admit: 2017-05-16 | Discharge: 2017-05-16 | Disposition: A | Discharge status: Home / Self Care | Payer: Medicare Other | Source: Ambulatory Visit | Attending: Neurology | Admitting: Neurology

## 2017-05-16 DIAGNOSIS — G4489 Other headache syndrome: Secondary | ICD-10-CM | POA: Diagnosis not present

## 2017-05-16 MED ORDER — GADOBENATE DIMEGLUMINE 529 MG/ML IV SOLN
20.0000 mL | Freq: Once | INTRAVENOUS | Status: AC | PRN
  Administered 2017-05-16: 20 mL via INTRAVENOUS

## 2017-05-18 ENCOUNTER — Telehealth: Payer: Self-pay | Admitting: Neurology

## 2017-05-18 MED ORDER — GABAPENTIN 300 MG PO CAPS: 600.0000 mg | ORAL_CAPSULE | Freq: Every day | ORAL | 3 refills | Status: DC

## 2017-05-18 NOTE — Telephone Encounter (Signed)
I called patient. MRI the brain shows very minimal white matter changes, nothing that should be causing headaches.  The patient indicates that the gabapentin has helped reduce the severity of the events, they are still occurring about every other day, we will go to 600 mg in the evening on this medication, she is otherwise tolerating the drug well.   MRI brain 05/17/17:  IMPRESSION:  Mildly abnormal MRI brain (with and without) demonstrating: 1. Few scattered periventricular and subcortical foci of non-specific T2 hyperintensities. These findings are non-specific and considerations include autoimmune, inflammatory, post-infectious, microvascular ischemic or migraine associated etiologies.  2. No abnormal lesions on post-contrast views.  3. No acute findings.

## 2017-08-01 ENCOUNTER — Encounter: Payer: Self-pay | Admitting: Adult Health

## 2017-08-01 ENCOUNTER — Ambulatory Visit (INDEPENDENT_AMBULATORY_CARE_PROVIDER_SITE_OTHER): Payer: Medicare Other | Admitting: Adult Health

## 2017-08-01 VITALS — BP 132/68 | HR 68 | Ht 64.0 in | Wt 221.0 lb

## 2017-08-01 DIAGNOSIS — G4489 Other headache syndrome: Secondary | ICD-10-CM | POA: Diagnosis not present

## 2017-08-01 MED ORDER — GABAPENTIN 300 MG PO CAPS: 600.0000 mg | ORAL_CAPSULE | Freq: Every day | ORAL | 11 refills | Status: DC

## 2017-08-01 NOTE — Patient Instructions (Signed)
Your Plan:  Continue gabapentin 600 mg at bedtime If your symptoms worsen or you develop new symptoms please let us know.    Thank you for coming to see [place] at [facility]. I hope we have been able to provide you high quality care today.  You may receive a patient satisfaction survey over the next few weeks. We would appreciate your feedback and comments so that we may continue to improve ourselves and the health of our patients.

## 2017-08-01 NOTE — Progress Notes (Signed)
I have read the note, and I agree with the clinical assessment and plan.  WILLIS,CHARLES KEITH   

## 2017-08-01 NOTE — Progress Notes (Signed)
PATIENT: Natasha [name] DOB: 02/09/1946  REASON FOR VISIT: follow up- headache HISTORY FROM: patient  HISTORY OF PRESENT ILLNESS: Today 08/01/17 Natasha [name] is a 71 year old female with a history of headaches. She returns today for follow-up. At the last visit she was placed on gabapentin 600 mg at bedtime. She reports that this has been beneficial for her headaches. She states in the month of June she had approximately 14 days where she was woken up with this pain. She states in July she had 11 days and in August she only had 4 days. She states that the sharp shooting pain is always in the left jaw area. She states it can last anywhere from 30 seconds to 1 minute. At her first  visit with [name] she was also having numbness on the left side of the head as well as neck stiffness, she states that both of these symptoms have resolved. She states that she never had this pain during the day. She is tolerating gabapentin well. She returns today for follow-up.  HISTORY 04/30/17: Natasha [name] is a 71 year old right-handed white female with a history of onset of headaches that began in October 2017. The patient had spontaneous onset of the headache that began as an "explosion" on the left side of the head affecting mainly the ear and going down the neck on the left side. The episodes initially were relatively infrequent occurring once every 2 or 3 weeks. In the month of May, the episodes have become more frequent, and are occurring several times a week and may occur several times at night. All events have occurred coming out of sleep, she has never had any episodes during the daytime. The patient has discomfort around the left ear, she has some neck stiffness at times. The patient has not been placed on any medications for the headache. She has undergone blood work that has included a C-reactive protein and sedimentation rate which were unremarkable. She denies any numbness or weakness of the extremities, she  has not had any balance changes or difficulty controlling the bowels or the bladder. She denies any blackout episodes, dizziness, or confusion. She has not had any visual complaints or nausea or vomiting with the headache. She is sent to this office for further evaluation.    REVIEW OF SYSTEMS: Out of a complete 14 system review of symptoms, the patient complains only of the following symptoms, and all other reviewed systems are negative.  See history of present illness  ALLERGIES: Allergies  Allergen Reactions  . Naproxen Sodium     REACTION: stomach pain  . Clindamycin/Lincomycin Itching and Rash    HOME MEDICATIONS: Outpatient Medications Prior to Visit  Medication Sig Dispense Refill  . amLODipine (NORVASC) 5 MG tablet TAKE 1 TABLET EVERY DAY 30 tablet 11  . aspirin 81 MG tablet Take 81 mg by mouth daily.      Marland Kitchen atorvastatin (LIPITOR) 20 MG tablet TAKE 1 TABLET EVERY DAY 30 tablet 11  . calcitonin, salmon, (MIACALCIN/FORTICAL) 200 UNIT/ACT nasal spray Place 1 spray into alternate nostrils daily. 3.7 mL 11  . Cholecalciferol (VITAMIN D-3) 1000 UNITS CAPS Take 2 tablets by mouth daily.     Marland Kitchen gabapentin (NEURONTIN) 300 MG capsule Take 2 capsules (600 mg total) by mouth at bedtime. 60 capsule 3  . losartan-hydrochlorothiazide (HYZAAR) 100-12.5 MG tablet TAKE 1/2 TABLET EVERYDAY 45 tablet 3  . pantoprazole (PROTONIX) 40 MG tablet Take 1 tablet (40 mg total) by mouth daily. 90 tablet  3   No facility-administered medications prior to visit.     PAST MEDICAL HISTORY: Past Medical History:  Diagnosis Date  . CAD (coronary artery disease)    (4/05 carotid dopler- 39% stenosis) , (ECHO- mild LVH, EF 60%)  . CHF (congestive heart failure) ([place])   . DDD (degenerative disc disease), lumbar 11/2006   L5-S1 bulging disk  . Headache syndrome 04/30/2017   Left occipital and ear   . Hyperlipidemia   . Hypertension   . Osteopenia   . Thrombocytopenia ([place])    mild    PAST SURGICAL  HISTORY: Past Surgical History:  Procedure Laterality Date  . BACK SURGERY  2004   fall at work  . BREAST BIOPSY Right 2017   core- neg  . CHOLECYSTECTOMY    . SHOULDER SURGERY  2004   fall  . TUBAL LIGATION      FAMILY HISTORY: Family History  Problem Relation Age of Onset  . Coronary artery disease Father   . Lung cancer Sister   . Breast cancer Maternal Grandmother   . Diabetes Sister   . Other Sister        OP  . Other Mother        has pacemaker    SOCIAL HISTORY: Social History   Social History  . Marital status: Married    Spouse name: Kashari Chalmers  . Number of children: 4  . Years of education: 12   Occupational History  . Marketing- Retired Physiological scientist   Social History Main Topics  . Smoking status: Never Smoker  . Smokeless tobacco: Never Used  . Alcohol use No  . Drug use: No  . Sexual activity: Not on file   Other Topics Concern  . Not on file   Social History Narrative   Lives with husband, Fritz Pickerel   Caffeine use: 1 cup coffee every morning   Soda rare   4 daughters      PHYSICAL EXAM  Vitals:   08/01/17 1115  BP: 132/68  Pulse: 68  Weight: 221 lb (100.2 kg)  Height: 5\' 4"  (1.626 m)   Body mass index is 37.93 kg/m.  Generalized: Well developed, in no acute distress   Neurological examination  Mentation: Alert oriented to time, place, history taking. Follows all commands speech and language fluent Cranial nerve II-XII: Pupils were equal round reactive to light. Extraocular movements were full, visual field were full on confrontational test. Facial sensation and strength were normal. Uvula tongue midline. Head turning and shoulder shrug  were normal and symmetric. Motor: The motor testing reveals 5 over 5 strength of all 4 extremities. Good symmetric motor tone is noted throughout.  Sensory: Sensory testing is intact to soft touch on all 4 extremities. No evidence of extinction is noted.  Coordination: Cerebellar testing  reveals good finger-nose-finger and heel-to-shin bilaterally.  Gait and station: Gait is normal.  Reflexes: Deep tendon reflexes are symmetric and normal bilaterally.   DIAGNOSTIC DATA (LABS, IMAGING, TESTING) - I reviewed patient records, labs, notes, testing and imaging myself where available.  Lab Results  Component Value Date   WBC 7.1 04/11/2017   HGB 15.3 (H) 04/11/2017   HCT 45.4 04/11/2017   MCV 88.3 04/11/2017   PLT 152.0 04/11/2017      Component Value Date/Time   NA 143 11/24/2016 0912   NA 140 07/11/2014 0942   NA 142 07/08/2009 0822   K 4.5 11/24/2016 0912   K 4.2 07/11/2014 0942   K 4.4  07/08/2009 0822   CL 105 11/24/2016 0912   CL 106 07/11/2014 0942   CL 104 07/08/2009 0822   CO2 29 11/24/2016 0912   CO2 28 07/11/2014 0942   CO2 31 07/08/2009 0822   GLUCOSE 113 (H) 11/24/2016 0912   GLUCOSE 128 (H) 07/11/2014 0942   GLUCOSE 108 07/08/2009 0822   BUN 18 11/24/2016 0912   BUN 13 07/11/2014 0942   BUN 16 07/08/2009 0822   CREATININE 1.02 11/24/2016 0912   CREATININE 0.94 07/11/2014 0942   CREATININE 0.9 07/08/2009 0822   CALCIUM 10.7 (H) 11/24/2016 0912   CALCIUM 9.8 07/11/2014 0942   CALCIUM 9.5 07/08/2009 0822   PROT 7.2 11/24/2016 0912   PROT 8.0 07/11/2014 0942   PROT 7.1 07/08/2009 0822   ALBUMIN 4.6 11/24/2016 0912   ALBUMIN 4.1 07/11/2014 0942   AST 18 11/24/2016 0912   AST 31 07/11/2014 0942   AST 26 07/08/2009 0822   ALT 15 11/24/2016 0912   ALT 24 07/11/2014 0942   ALT 21 07/08/2009 0822   ALKPHOS 93 11/24/2016 0912   ALKPHOS 94 07/11/2014 0942   ALKPHOS 69 07/08/2009 0822   BILITOT 0.9 11/24/2016 0912   BILITOT 0.8 07/11/2014 0942   BILITOT 0.80 07/08/2009 0822   GFRNONAA >60 07/11/2014 0942   GFRAA >60 07/11/2014 0942   Lab Results  Component Value Date   CHOL 152 11/24/2016   HDL 51.60 11/24/2016   LDLCALC 72 11/24/2016   TRIG 143.0 11/24/2016   CHOLHDL 3 11/24/2016   Lab Results  Component Value Date   HGBA1C 5.8  11/24/2016   No results found for: [id number] Lab Results  Component Value Date   TSH 2.10 11/24/2016      ASSESSMENT AND PLAN 71 y.o. year old female  has a past medical history of CAD (coronary artery disease); CHF (congestive heart failure) ([place]); DDD (degenerative disc disease), lumbar (11/2006); Headache syndrome (04/30/2017); Hyperlipidemia; Hypertension; Osteopenia; and Thrombocytopenia ([place]). here with:  1. Headache syndrome  The patient has gotten good benefit with gabapentin. I advised that she will continue doing gabapentin 600 mg at bedtime. Hopefully these episodes continued to decrease. However I advised that if her symptoms worsen she should let us know. She will follow-up in 6 months or sooner if needed.  I spent 15 minutes with the patient. 50% of this time was spent discussing her medication   Ward Givens, MSN, NP-C 08/01/2017, 11:23 AM [facility] [address], [place], [place] [address] [phone]

## 2017-10-22 ENCOUNTER — Encounter: Payer: Self-pay | Admitting: Family Medicine

## 2017-10-22 ENCOUNTER — Ambulatory Visit: Payer: Medicare Other | Admitting: Family Medicine

## 2017-10-22 VITALS — BP 142/70 | HR 69 | Temp 97.8°F | Ht 64.0 in | Wt 214.4 lb

## 2017-10-22 DIAGNOSIS — N950 Postmenopausal bleeding: Secondary | ICD-10-CM | POA: Insufficient documentation

## 2017-10-22 NOTE — Assessment & Plan Note (Signed)
New- urgent referral to GYN for work up/endometrial biopsy. The patient indicates understanding of these issues and agrees with the plan. Orders Placed This Encounter  Procedures  . Ambulatory referral to Gynecology

## 2017-10-22 NOTE — Progress Notes (Signed)
Subjective:   Patient ID: Natasha [name], female    DOB: 1946/09/07, 71 y.o.   MRN: [id number]  Natasha [name] is a pleasant 71 y.o. year old female who presents to clinic today with Menstrual Problem (Patient is here today C/O spotting that started on 11.11.2018 and has since increased.)  on 10/22/2017  HPI:  Post menopausal bleeding- LMP 1995. Has never had any PMB until yesterday.  Started as light spotting, now more constant and heavier.  No cramping.  No urinary symptoms.  No pain- has been over a week since she had intercourse so she does not think it is from trauma.   Current Outpatient Medications on File Prior to Visit  Medication Sig Dispense Refill  . amLODipine (NORVASC) 5 MG tablet TAKE 1 TABLET EVERY DAY 30 tablet 11  . aspirin 81 MG tablet Take 81 mg by mouth daily.      Marland Kitchen atorvastatin (LIPITOR) 20 MG tablet TAKE 1 TABLET EVERY DAY 30 tablet 11  . calcitonin, salmon, (MIACALCIN/FORTICAL) 200 UNIT/ACT nasal spray Place 1 spray into alternate nostrils daily. 3.7 mL 11  . Cholecalciferol (VITAMIN D-3) 1000 UNITS CAPS Take 2 tablets by mouth daily.     Marland Kitchen gabapentin (NEURONTIN) 300 MG capsule Take 2 capsules (600 mg total) by mouth at bedtime. 60 capsule 11  . losartan-hydrochlorothiazide (HYZAAR) 100-12.5 MG tablet TAKE 1/2 TABLET EVERYDAY 45 tablet 3  . pantoprazole (PROTONIX) 40 MG tablet Take 1 tablet (40 mg total) by mouth daily. 90 tablet 3   No current facility-administered medications on file prior to visit.     Allergies  Allergen Reactions  . Naproxen Sodium     REACTION: stomach pain  . Clindamycin/Lincomycin Itching and Rash    Past Medical History:  Diagnosis Date  . CAD (coronary artery disease)    (4/05 carotid dopler- 39% stenosis) , (ECHO- mild LVH, EF 60%)  . CHF (congestive heart failure) ([place])   . DDD (degenerative disc disease), lumbar 11/2006   L5-S1 bulging disk  . Headache syndrome 04/30/2017   Left occipital and ear   .  Hyperlipidemia   . Hypertension   . Osteopenia   . Thrombocytopenia ([place])    mild    Past Surgical History:  Procedure Laterality Date  . BACK SURGERY  2004   fall at work  . BREAST BIOPSY Right 2017   core- neg  . CHOLECYSTECTOMY    . SHOULDER SURGERY  2004   fall  . TUBAL LIGATION      Family History  Problem Relation Age of Onset  . Coronary artery disease Father   . Lung cancer Sister   . Breast cancer Maternal Grandmother   . Diabetes Sister   . Other Sister        OP  . Other Mother        has pacemaker    Social History   Socioeconomic History  . Marital status: Married    Spouse name: Makynzee Tigges  . Number of children: 4  . Years of education: 48  . Highest education level: Not on file  Social Needs  . Financial resource strain: Not on file  . Food insecurity - worry: Not on file  . Food insecurity - inability: Not on file  . Transportation needs - medical: Not on file  . Transportation needs - non-medical: Not on file  Occupational History  . Occupation: Pharmacologist- Retired    Fish farm manager: [employer]  Tobacco Use  . Smoking  status: Never Smoker  . Smokeless tobacco: Never Used  Substance and Sexual Activity  . Alcohol use: No    Alcohol/week: 0.0 oz  . Drug use: No  . Sexual activity: Not on file  Other Topics Concern  . Not on file  Social History Narrative   Lives with husband, Fritz Pickerel   Caffeine use: 1 cup coffee every morning   Soda rare   4 daughters   The PMH, PSH, Social History, Family History, Medications, and allergies have been reviewed in [facility], and have been updated if relevant.   Review of Systems  Genitourinary: Positive for vaginal bleeding. Negative for decreased urine volume, difficulty urinating, dyspareunia, dysuria, enuresis, flank pain, frequency, genital sores, hematuria, menstrual problem, pelvic pain, urgency and vaginal discharge.  All other systems reviewed and are negative.      Objective:    BP (!)  142/70 (BP Location: Right Arm, Patient Position: Sitting, Cuff Size: Large)   Pulse 69   Temp 97.8 F (36.6 C) (Oral)   Ht 5\' 4"  (1.626 m)   Wt 214 lb 6.4 oz (97.3 kg)   LMP 12/11/1993   SpO2 96%   BMI 36.80 kg/m    Physical Exam  Constitutional: She is oriented to person, place, and time. She appears well-developed and well-nourished. No distress.  HENT:  Head: Normocephalic and atraumatic.  Eyes: Conjunctivae are normal.  Cardiovascular: Normal rate.  Pulmonary/Chest: Effort normal.  Abdominal: Soft. She exhibits no distension. There is no tenderness. There is no rebound.  Neurological: She is alert and oriented to person, place, and time. No cranial nerve deficit.  Skin: Skin is warm and dry. She is not diaphoretic.  Psychiatric: She has a normal mood and affect. Her behavior is normal. Judgment and thought content normal.  Nursing note and vitals reviewed.         Assessment & Plan:   Post-menopausal bleeding - Plan: Ambulatory referral to Gynecology No Follow-up on file.

## 2017-10-22 NOTE — Patient Instructions (Signed)
Great to meet you.  Please stop by to see Natasha [name] on your way out.

## 2017-10-23 ENCOUNTER — Other Ambulatory Visit (HOSPITAL_COMMUNITY)
Admission: RE | Admit: 2017-10-23 | Discharge: 2017-10-23 | Disposition: A | Discharge status: Home / Self Care | Payer: Medicare Other | Source: Ambulatory Visit | Attending: Obstetrics and Gynecology | Admitting: Obstetrics and Gynecology

## 2017-10-23 ENCOUNTER — Encounter: Payer: Self-pay | Admitting: Obstetrics and Gynecology

## 2017-10-23 ENCOUNTER — Ambulatory Visit (INDEPENDENT_AMBULATORY_CARE_PROVIDER_SITE_OTHER): Payer: Medicare Other | Admitting: Obstetrics and Gynecology

## 2017-10-23 VITALS — BP 145/75 | HR 71 | Wt 214.6 lb

## 2017-10-23 DIAGNOSIS — M5136 Other intervertebral disc degeneration, lumbar region: Secondary | ICD-10-CM | POA: Insufficient documentation

## 2017-10-23 DIAGNOSIS — M8580 Other specified disorders of bone density and structure, unspecified site: Secondary | ICD-10-CM | POA: Insufficient documentation

## 2017-10-23 DIAGNOSIS — I110 Hypertensive heart disease with heart failure: Secondary | ICD-10-CM | POA: Insufficient documentation

## 2017-10-23 DIAGNOSIS — I2510 Atherosclerotic heart disease of native coronary artery without angina pectoris: Secondary | ICD-10-CM | POA: Insufficient documentation

## 2017-10-23 DIAGNOSIS — E785 Hyperlipidemia, unspecified: Secondary | ICD-10-CM | POA: Diagnosis not present

## 2017-10-23 DIAGNOSIS — N950 Postmenopausal bleeding: Secondary | ICD-10-CM | POA: Diagnosis not present

## 2017-10-23 DIAGNOSIS — I509 Heart failure, unspecified: Secondary | ICD-10-CM | POA: Diagnosis not present

## 2017-10-23 DIAGNOSIS — Z01419 Encounter for gynecological examination (general) (routine) without abnormal findings: Secondary | ICD-10-CM | POA: Insufficient documentation

## 2017-10-23 NOTE — Procedures (Signed)
Endometrial Biopsy Procedure Note  Pre-operative Diagnosis: Postmenopausal bleeding  Post-operative Diagnosis: same  Procedure Details  Urine pregnancy test was not done.  The risks (including infection, bleeding, pain, and uterine perforation) and benefits of the procedure were explained to the patient and Written informed consent was obtained.  The patient was placed in the dorsal lithotomy position.  Bimanual exam showed the uterus to be in the neutral position.  A Graves' speculum inserted in the vagina, and the cervix was visualized and a pap smear performed. The cervix was then prepped with povidone iodine, and a sharp tenaculum was applied to the anterior lip of the cervix for stabilization.  A pipelle was inserted, but I couldn't get past the internal os; os finder was used to easily open the internal os. The pipelle was then inserted into the uterine cavity and sounded the uterus to a depth of 6cm.  A small amount of tissue was collected after 2 passes. The sample was sent for pathologic examination.  Condition: Stable  Complications: None  Plan: The patient was advised to call for any fever or for prolonged or severe pain or bleeding. She was advised to use OTC analgesics as needed for mild to moderate pain. She was advised to avoid vaginal intercourse for 48 hours or until the bleeding has completely stopped.  Durene Romans MD Attending Center for [employer] Fish farm manager)

## 2017-10-23 NOTE — Progress Notes (Signed)
Obstetrics and Gynecology New Patient Evaluation  Appointment Date: 10/23/2017  OBGYN Clinic: [facility]  Primary Care Provider: Loura Pardon [name]  Referring Provider: Lucille Passy, MD  Chief Complaint:  Chief Complaint  Patient presents with  . New Patient (Initial Visit)    BIOPSY    History of Present Illness: Natasha [name] is [name] 71 y.o. Caucasian P4 (Patient's last menstrual period was 12/11/1993.), seen for the above chief complaint.  Starting this past Sunday, she started having pink discharge with wiping and more when she first gets up in the morning. No pain. No h/o prior issues.   No breast s/s, fevers, chills, chest pain, SOB, nausea, vomiting, abdominal pain, dysuria, hematuria, vaginal itching, dyspareunia, diarrhea, constipation, blood in BMs  Review of Systems: as noted in the History of Present Illness.  Patient Active Problem List   Diagnosis Date Noted  . Post-menopausal bleeding 10/22/2017  . Headache syndrome 04/30/2017  . Facial pain 04/11/2017  . Temple tenderness 04/11/2017  . Dermatitis 06/27/2016  . Routine general medical examination at [name] facility 11/22/2015  . Colon cancer screening 11/22/2015  . Special screening for malignant neoplasms, colon 11/17/2014  . Estrogen deficiency 11/17/2014  . Encounter for Medicare annual wellness exam 10/28/2013  . Hyperglycemia 10/28/2013  . Obesity 10/28/2013  . Post-menopausal 09/24/2012  . Allergic rhinitis 04/26/2012  . Gynecologic exam normal 04/19/2011  . Osteopenia 11/10/2008  . THROMBOCYTOPENIA 08/11/2008  . POSTMENOPAUSAL STATUS 07/09/2008  . HYPERCHOLESTEROLEMIA 07/16/2007  . Essential hypertension 07/16/2007  . CORONARY ARTERY DISEASE 07/16/2007  . CONGESTIVE HEART FAILURE 07/16/2007  . DEGENERATIVE DISC DISEASE, LUMBAR SPINE 07/16/2007    Past Medical History:  Past Medical History:  Diagnosis Date  . CAD (coronary artery disease)    (4/05  carotid dopler- 39% stenosis) , (ECHO- mild LVH, EF 60%)  . CHF (congestive heart failure) ([place])   . DDD (degenerative disc disease), lumbar 11/2006   L5-S1 bulging disk  . Headache syndrome 04/30/2017   Left occipital and ear   . Hyperlipidemia   . Hypertension   . Osteopenia   . Thrombocytopenia ([place])    mild    Past Surgical History:  Past Surgical History:  Procedure Laterality Date  . BACK SURGERY  2004   fall at work  . BREAST BIOPSY Right 2017   core- neg  . CHOLECYSTECTOMY    . SHOULDER SURGERY  2004   fall  . TUBAL LIGATION      Past Obstetrical History:  OB History  Gravida Para Term Preterm AB Living  4 4          SAB TAB Ectopic Multiple Live Births               # Outcome Date GA Lbr Len/2nd Weight Sex Delivery Anes PTL Lv  4 Para           3 Para           2 Para           1 Para             Obstetric Comments  SVD x 4.     Past Gynecological History: As per HPI. History of Pap Smear(s): Yes.   Last pap [name] few years ago, which were always normal.  History of HRT use: No.  Social History:  Social History   Socioeconomic History  . Marital status: Married    Spouse name: Natasha [name]  .  Number of children: 4  . Years of education: 46  . Highest education level: Not on file  Social Needs  . Financial resource strain: Not on file  . Food insecurity - worry: Not on file  . Food insecurity - inability: Not on file  . Transportation needs - medical: Not on file  . Transportation needs - non-medical: Not on file  Occupational History  . Occupation: Pharmacologist- Retired    Fish farm manager: [employer]  Tobacco Use  . Smoking status: Never Smoker  . Smokeless tobacco: Never Used  Substance and Sexual Activity  . Alcohol use: No    Alcohol/week: 0.0 oz  . Drug use: No  . Sexual activity: Not on file  Other Topics Concern  . Not on file  Social History Narrative   Lives with husband, Natasha [name]   Caffeine use: 1 cup coffee every morning   Soda  rare   4 daughters    Family History:  Family History  Problem Relation Age of Onset  . Coronary artery disease Father   . Lung cancer Sister   . Breast cancer Maternal Grandmother   . Diabetes Sister   . Other Sister        OP  . Other Mother        has pacemaker   She denies any female cancers, bleeding or blood clotting disorders.   Health Maintenance:  Mammogram(s): Yes.   Date: 01/2017  Medications Natasha [name]. Natasha [name] had no medications administered during this visit. Current Outpatient Medications  Medication Sig Dispense Refill  . amLODipine (NORVASC) 5 MG tablet TAKE 1 TABLET EVERY DAY 30 tablet 11  . aspirin 81 MG tablet Take 81 mg by mouth daily.      Natasha [name] atorvastatin (LIPITOR) 20 MG tablet TAKE 1 TABLET EVERY DAY 30 tablet 11  . calcitonin, salmon, (MIACALCIN/FORTICAL) 200 UNIT/ACT nasal spray Place 1 spray into alternate nostrils daily. 3.7 mL 11  . Cholecalciferol (VITAMIN D-3) 1000 UNITS CAPS Take 2 tablets by mouth daily.     Natasha [name] gabapentin (NEURONTIN) 300 MG capsule Take 2 capsules (600 mg total) by mouth at bedtime. 60 capsule 11  . losartan-hydrochlorothiazide (HYZAAR) 100-12.5 MG tablet TAKE 1/2 TABLET EVERYDAY 45 tablet 3  . pantoprazole (PROTONIX) 40 MG tablet Take 1 tablet (40 mg total) by mouth daily. 90 tablet 3   No current facility-administered medications for this visit.     Allergies Naproxen sodium and Clindamycin/lincomycin   Physical Exam:  BP (!) 145/75   Pulse 71   Wt 214 lb 9.6 oz (97.3 kg)   LMP 12/11/1993   BMI 36.84 kg/m  Body mass index is 36.84 kg/m. General appearance: Well nourished, well developed female in no acute distress.  Cardiovascular: normal s1 and s2.  No murmurs, rubs or gallops. Respiratory:  Clear to auscultation bilateral. Normal respiratory effort Abdomen: positive bowel sounds and no masses, hernias; diffusely non tender to palpation, non distended Neuro/Psych:  Normal mood and affect.  Skin:  Warm and dry.   Lymphatic:  No inguinal lymphadenopathy.   Pelvic exam: is not limited by body habitus EGBUS: within normal limits with mild to moderate atrophy, Vagina: within normal limits and with no blood or discharge in the vault, +slight cystocele, Cervix: normal appearing cervix without tenderness, discharge or lesions. Uterus:  nonenlarged and non tender and Adnexa:  normal adnexa and no mass, fullness, tenderness Rectovaginal: deferred  See procedure note for endometrial biopsy  Laboratory: none  Radiology: none  Assessment: pt stable  Plan:  1. Post-menopausal bleeding F/u pap and embx. D/w her that if path is negative and no more bleeding after [name] few days then no need for an u/s - Cytology - PAP - Surgical pathology  RTC PRN  Durene Romans MD Attending [facility])

## 2017-10-24 ENCOUNTER — Encounter: Payer: Self-pay | Admitting: Obstetrics and Gynecology

## 2017-10-29 ENCOUNTER — Encounter: Payer: Self-pay | Admitting: *Deleted

## 2017-10-30 ENCOUNTER — Telehealth: Payer: Self-pay | Admitting: *Deleted

## 2017-10-30 LAB — CYTOLOGY - PAP
Diagnosis: NEGATIVE
HPV: NOT DETECTED

## 2017-10-30 NOTE — Telephone Encounter (Signed)
Called pt to adv normal pap and negative EMB - per Dr. Ilda Basset pt can RTO for follow up if PMB reoccurs. Pt states she has stopped bleeding and has no reoccurrence as of today. She understands to call if this happens again.

## 2017-11-26 ENCOUNTER — Ambulatory Visit (INDEPENDENT_AMBULATORY_CARE_PROVIDER_SITE_OTHER): Payer: Medicare Other

## 2017-11-26 VITALS — BP 124/70 | HR 52 | Temp 97.5°F | Ht 64.5 in | Wt 213.2 lb

## 2017-11-26 DIAGNOSIS — R739 Hyperglycemia, unspecified: Secondary | ICD-10-CM

## 2017-11-26 DIAGNOSIS — E7800 Pure hypercholesterolemia, unspecified: Secondary | ICD-10-CM | POA: Diagnosis not present

## 2017-11-26 DIAGNOSIS — I10 Essential (primary) hypertension: Secondary | ICD-10-CM | POA: Diagnosis not present

## 2017-11-26 DIAGNOSIS — Z0000 Encounter for general adult medical examination without abnormal findings: Secondary | ICD-10-CM | POA: Diagnosis not present

## 2017-11-26 DIAGNOSIS — M8580 Other specified disorders of bone density and structure, unspecified site: Secondary | ICD-10-CM

## 2017-11-26 DIAGNOSIS — D582 Other hemoglobinopathies: Secondary | ICD-10-CM

## 2017-11-26 LAB — COMPREHENSIVE METABOLIC PANEL
ALT: 16 U/L (ref 0–35)
AST: 18 U/L (ref 0–37)
Albumin: 4.2 g/dL (ref 3.5–5.2)
Alkaline Phosphatase: 76 U/L (ref 39–117)
BUN: 19 mg/dL (ref 6–23)
CO2: 31 mEq/L (ref 19–32)
Calcium: 9.8 mg/dL (ref 8.4–10.5)
Chloride: 106 mEq/L (ref 96–112)
Creatinine, Ser: 1 mg/dL (ref 0.40–1.20)
GFR: 57.95 mL/min — ABNORMAL LOW (ref >60.00)
Glucose, Bld: 99 mg/dL (ref 70–99)
Potassium: 4.6 mEq/L (ref 3.5–5.1)
Sodium: 142 mEq/L (ref 135–145)
Total Bilirubin: 0.8 mg/dL (ref 0.2–1.2)
Total Protein: 6.9 g/dL (ref 6.0–8.3)

## 2017-11-26 LAB — CBC WITH DIFFERENTIAL/PLATELET
Basophils Absolute: 0 10*3/uL (ref 0.0–0.1)
Basophils Relative: 1 % (ref 0.0–3.0)
Eosinophils Absolute: 0.1 10*3/uL (ref 0.0–0.7)
Eosinophils Relative: 3.6 % (ref 0.0–5.0)
HCT: 43.9 % (ref 36.0–46.0)
Hemoglobin: 14.8 g/dL (ref 12.0–15.0)
Lymphocytes Relative: 31.9 % (ref 12.0–46.0)
Lymphs Abs: 1.2 10*3/uL (ref 0.7–4.0)
MCHC: 33.7 g/dL (ref 30.0–36.0)
MCV: 89 fl (ref 78.0–100.0)
Monocytes Absolute: 0.3 10*3/uL (ref 0.1–1.0)
Monocytes Relative: 7 % (ref 3.0–12.0)
Neutro Abs: 2.1 10*3/uL (ref 1.4–7.7)
Neutrophils Relative %: 56.5 % (ref 43.0–77.0)
Platelets: 124 10*3/uL — ABNORMAL LOW (ref 150.0–400.0)
RBC: 4.93 Mil/uL (ref 3.87–5.11)
RDW: 14 % (ref 11.5–15.5)
WBC: 3.6 10*3/uL — ABNORMAL LOW (ref 4.0–10.5)

## 2017-11-26 LAB — LIPID PANEL
Cholesterol: 143 mg/dL (ref 0–200)
HDL: 55.7 mg/dL (ref >39.00)
LDL Cholesterol: 70 mg/dL (ref 0–99)
NonHDL: 87.38
Total CHOL/HDL Ratio: 3
Triglycerides: 85 mg/dL (ref 0.0–149.0)
VLDL: 17 mg/dL (ref 0.0–40.0)

## 2017-11-26 LAB — TSH: TSH: 2.39 u[IU]/mL (ref 0.35–4.50)

## 2017-11-26 LAB — VITAMIN D 25 HYDROXY (VIT D DEFICIENCY, FRACTURES): VITD: 44.69 ng/mL (ref 30.00–100.00)

## 2017-11-26 LAB — HEMOGLOBIN A1C: Hgb A1c MFr Bld: 5.9 % (ref 4.6–6.5)

## 2017-11-26 NOTE — Patient Instructions (Addendum)
Natasha [name] , Thank you for taking time to come for your Medicare Wellness Visit. I appreciate your ongoing commitment to your health goals. Please review the following plan we discussed and let me know if I can assist you in the future.   These are the goals we discussed: Goals    . Follow up with Primary Care Provider     Starting 11/26/2017, I will continue to take medications as prescribed and to follow up with PCP as scheduled.        This is a list of the screening recommended for you and due dates:  Health Maintenance  Topic Date Due  . Stool Blood Test  12/27/2019*  . Mammogram  01/22/2018  . Pap Smear  10/23/2018  . Cologuard (Stool DNA test)  12/27/2019  . Tetanus Vaccine  03/06/2024  . Flu Shot  Completed  . DEXA scan (bone density measurement)  Completed  .  Hepatitis C: One time screening is recommended by Center for Disease Control  (CDC) for  adults born from [birth year] through 1965.   Completed  . Pneumonia vaccines  Completed  *Topic was postponed. The date shown is not the original due date.   Preventive Care for Adults  A healthy lifestyle and preventive care can promote health and wellness. Preventive health guidelines for adults include the following key practices.  . A routine yearly physical is a good way to check with your health care provider about your health and preventive screening. It is a chance to share any concerns and updates on your health and to receive a thorough exam.  . Visit your dentist for a routine exam and preventive care every 6 months. Brush your teeth twice a day and floss once a day. Good oral hygiene prevents tooth decay and gum disease.  . The frequency of eye exams is based on your age, health, family medical history, use  of contact lenses, and other factors. Follow your health care provider's recommendations for frequency of eye exams.  . Eat a healthy diet. Foods like vegetables, fruits, whole grains, low-fat dairy products, and lean  protein foods contain the nutrients you need without too many calories. Decrease your intake of foods high in solid fats, added sugars, and salt. Eat the right amount of calories for you. Get information about a proper diet from your health care provider, if necessary.  . Regular physical exercise is one of the most important things you can do for your health. Most adults should get at least 150 minutes of moderate-intensity exercise (any activity that increases your heart rate and causes you to sweat) each week. In addition, most adults need muscle-strengthening exercises on 2 or more days a week.  Silver Sneakers may be a benefit available to you. To determine eligibility, you may visit the website: www.silversneakers.com or contact program at 740-046-3582 Mon-Fri between 8AM-8PM.   . Maintain a healthy weight. The body mass index (BMI) is a screening tool to identify possible weight problems. It provides an estimate of body fat based on height and weight. Your health care provider can find your BMI and can help you achieve or maintain a healthy weight.   For adults 20 years and older: ? A BMI below 18.5 is considered underweight. ? A BMI of 18.5 to 24.9 is normal. ? A BMI of 25 to 29.9 is considered overweight. ? A BMI of 30 and above is considered obese.   . Maintain normal blood lipids and cholesterol levels by exercising  and minimizing your intake of saturated fat. Eat a balanced diet with plenty of fruit and vegetables. Blood tests for lipids and cholesterol should begin at age 13 and be repeated every 5 years. If your lipid or cholesterol levels are high, you are over 50, or you are at high risk for heart disease, you may need your cholesterol levels checked more frequently. Ongoing high lipid and cholesterol levels should be treated with medicines if diet and exercise are not working.  . If you smoke, find out from your health care provider how to quit. If you do not use tobacco, please  do not start.  . If you choose to drink alcohol, please do not consume more than 2 drinks per day. One drink is considered to be 12 ounces (355 mL) of beer, 5 ounces (148 mL) of wine, or 1.5 ounces (44 mL) of liquor.  . If you are 58-66 years old, ask your health care provider if you should take aspirin to prevent strokes.  . Use sunscreen. Apply sunscreen liberally and repeatedly throughout the day. You should seek shade when your shadow is shorter than you. Protect yourself by wearing long sleeves, pants, a wide-brimmed hat, and sunglasses year round, whenever you are outdoors.  . Once a month, do a whole body skin exam, using a mirror to look at the skin on your back. Tell your health care provider of new moles, moles that have irregular borders, moles that are larger than a pencil eraser, or moles that have changed in shape or color.

## 2017-11-26 NOTE — Progress Notes (Signed)
Pre visit review using our clinic review tool, if applicable. No additional management support is needed unless otherwise documented below in the visit note. 

## 2017-11-26 NOTE — Progress Notes (Signed)
Subjective:   Natasha [name] is a 71 y.o. female who presents for Medicare Annual (Subsequent) preventive examination.  Review of Systems:  N/A Cardiac Risk Factors include: advanced age (>58men, >18 women);obesity (BMI >30kg/m2);dyslipidemia;hypertension     Objective:     Vitals: BP 124/70 (BP Location: Right Arm, Patient Position: Sitting, Cuff Size: Large)   Pulse (!) 52   Temp (!) 97.5 F (36.4 C) (Oral)   Ht 5' 4.5" (1.638 m) Comment: no shoes  Wt 213 lb 4 oz (96.7 kg)   LMP 12/11/1993   SpO2 98%   BMI 36.04 kg/m   Body mass index is 36.04 kg/m.  Advanced Directives 11/26/2017 11/24/2016  Does Patient Have a Medical Advance Directive? Yes Yes  Type of Paramedic of [place];Living will [place];Living will  Copy of [place] in Chart? Yes Yes    Tobacco Social History   Tobacco Use  Smoking Status Never Smoker  Smokeless Tobacco Never Used     Counseling given: No   Clinical Intake:  Pre-visit preparation completed: Yes  Pain : No/denies pain Pain Score: 0-No pain     Nutritional Status: BMI > 30  Obese Nutritional Risks: None Diabetes: No  How often do you need to have someone help you when you read instructions, pamphlets, or other written materials from your doctor or pharmacy?: 1 - Never What is the last grade level you completed in school?: 12th grade  Interpreter Needed?: No  Comments: pt lives with spouse Information entered by :: LPinson, LPN  Past Medical History:  Diagnosis Date  . CAD (coronary artery disease)    (4/05 carotid dopler- 39% stenosis) , (ECHO- mild LVH, EF 60%)  . CHF (congestive heart failure) ([place])   . DDD (degenerative disc disease), lumbar 11/2006   L5-S1 bulging disk  . Headache syndrome 04/30/2017   Left occipital and ear   . Hyperlipidemia   . Hypertension   . Osteopenia   . Thrombocytopenia ([place])    mild   Past Surgical History:    Procedure Laterality Date  . BACK SURGERY  2004   fall at work  . BREAST BIOPSY Right 2017   core- neg  . CHOLECYSTECTOMY    . SHOULDER SURGERY  2004   fall  . TUBAL LIGATION     Family History  Problem Relation Age of Onset  . Coronary artery disease Father   . Lung cancer Sister   . Breast cancer Maternal Grandmother   . Diabetes Sister   . Other Sister        OP  . Other Mother        has pacemaker   Social History   Socioeconomic History  . Marital status: Married    Spouse name: Amalee Olsen  . Number of children: 4  . Years of education: 29  . Highest education level: None  Social Needs  . Financial resource strain: None  . Food insecurity - worry: None  . Food insecurity - inability: None  . Transportation needs - medical: None  . Transportation needs - non-medical: None  Occupational History  . Occupation: Pharmacologist- Retired    Fish farm manager: [employer]  Tobacco Use  . Smoking status: Never Smoker  . Smokeless tobacco: Never Used  Substance and Sexual Activity  . Alcohol use: No    Alcohol/week: 0.0 oz  . Drug use: No  . Sexual activity: None  Other Topics Concern  . None  Social History Narrative   Lives with husband, Fritz Pickerel   Caffeine use: 1 cup coffee every morning   Soda rare   4 daughters    Outpatient Encounter Medications as of 11/26/2017  Medication Sig  . amLODipine (NORVASC) 5 MG tablet TAKE 1 TABLET EVERY DAY  . aspirin 81 MG tablet Take 81 mg by mouth daily.    Marland Kitchen atorvastatin (LIPITOR) 20 MG tablet TAKE 1 TABLET EVERY DAY  . calcitonin, salmon, (MIACALCIN/FORTICAL) 200 UNIT/ACT nasal spray Place 1 spray into alternate nostrils daily.  . Cholecalciferol (VITAMIN D-3) 1000 UNITS CAPS Take 2 tablets by mouth daily.   Marland Kitchen gabapentin (NEURONTIN) 300 MG capsule Take 2 capsules (600 mg total) by mouth at bedtime.  Marland Kitchen losartan-hydrochlorothiazide (HYZAAR) 100-12.5 MG tablet TAKE 1/2 TABLET EVERYDAY  . pantoprazole (PROTONIX) 40 MG tablet  Take 1 tablet (40 mg total) by mouth daily.   No facility-administered encounter medications on file as of 11/26/2017.     Activities of Daily Living In your present state of health, do you have any difficulty performing the following activities: 11/26/2017  Hearing? N  Vision? N  Difficulty concentrating or making decisions? N  Walking or climbing stairs? N  Dressing or bathing? N  Doing errands, shopping? N  Preparing Food and eating ? N  Using the Toilet? N  In the past six months, have you accidently leaked urine? N  Do you have problems with loss of bowel control? N  Managing your Medications? N  Managing your Finances? N  Housekeeping or managing your Housekeeping? N  Some recent data might be hidden    Patient Care Team: Tower, Wynelle Fanny, MD as PCP - General Kerin Ransom, Stephannie Li, OD as Consulting Physician (Optometry) Kathrynn Ducking, MD as Consulting Physician (Neurology) Altha Harm, [place] as Referring Physician (Dentistry)    Assessment:   This is a routine wellness examination for Natasha [name].   Hearing Screening   125Hz  250Hz  500Hz  1000Hz  2000Hz  3000Hz  4000Hz  6000Hz  8000Hz   Right ear:   40 40 40  40    Left ear:   40 40 40  40    Vision Screening Comments: Last vision exam in Dec 2017 with Dr. Kerin Ransom   Exercise Activities and Dietary recommendations Current Exercise Habits: The patient does not participate in regular exercise at present, Exercise limited by: None identified  Goals    . Follow up with Primary Care Provider     Starting 11/26/2017, I will continue to take medications as prescribed and to follow up with PCP as scheduled.        Fall Risk Fall Risk  11/26/2017 11/24/2016 11/22/2015 11/17/2014 10/28/2013  Falls in the past year? No No No No No   Depression Screen PHQ 2/9 Scores 11/26/2017 11/24/2016 11/22/2015 11/17/2014  PHQ - 2 Score 0 0 0 0  PHQ- 9 Score 0 - - -     Cognitive Function MMSE - Mini Mental State Exam 11/26/2017 11/24/2016    Orientation to time 5 5  Orientation to Place 5 5  Registration 3 3  Attention/ Calculation 0 0  Recall 3 3  Language- name 2 objects 0 0  Language- repeat 1 1  Language- follow 3 step command 3 3  Language- read & follow direction 0 0  Write a sentence 0 0  Copy design 0 0  Total score 20 20       PLEASE NOTE: A Mini-Cog screen was completed. Maximum score is 20. A value of 0 denotes  this part of [place] MMSE was not completed or the patient failed this part of the Mini-Cog screening.   Mini-Cog Screening Orientation to Time - Max 5 pts Orientation to Place - Max 5 pts Registration - Max 3 pts Recall - Max 3 pts Language Repeat - Max 1 pts Language Follow 3 Step Command - Max 3 pts   Immunization History  Administered Date(s) Administered  . Influenza Split 09/24/2012  . Influenza, High Dose Seasonal PF 10/03/2016  . Influenza,inj,Quad PF,6+ Mos 10/28/2013, 10/14/2014  . Influenza-Unspecified 09/06/2015, 09/11/2017  . Pneumococcal Conjugate-13 11/17/2014  . Pneumococcal Polysaccharide-23 10/28/2013  . Td 12/11/2002  . Tdap 03/06/2014    Screening Tests Health Maintenance  Topic Date Due  . COLON CANCER SCREENING ANNUAL FOBT  12/27/2019 (Originally 12/02/2016)  . MAMMOGRAM  01/22/2018  . PAP SMEAR  10/23/2018  . Fecal DNA (Cologuard)  12/27/2019  . TETANUS/TDAP  03/06/2024  . INFLUENZA VACCINE  Completed  . DEXA SCAN  Completed  . Hepatitis C Screening  Completed  . PNA vac Low Risk Adult  Completed       Plan:     I have personally reviewed, addressed, and noted the following in the patient's chart:  A. Medical and social history B. Use of alcohol, tobacco or illicit drugs  C. Current medications and supplements D. Functional ability and status E.  Nutritional status F.  Physical activity G. Advance directives H. List of other physicians I.  Hospitalizations, surgeries, and ER visits in previous 12 months J.  [place] to include  hearing, vision, cognitive, depression L. Referrals and appointments - none  In addition, I have reviewed and discussed with patient certain preventive protocols, quality metrics, and best practice recommendations. A written personalized care plan for preventive services as well as general preventive health recommendations were provided to patient.  See attached scanned questionnaire for additional information.   Signed,   Lindell Noe, MHA, BS, LPN Health Coach

## 2017-11-26 NOTE — Progress Notes (Signed)
PCP notes:   Health maintenance:  No gaps identified.  Abnormal screenings:   None  Patient concerns:   None  Nurse concerns:  None  Next PCP appt:   12/05/17 @ 1030  I reviewed health advisor's note, was available for consultation, and agree with documentation and plan. Loura Pardon MD

## 2017-12-05 ENCOUNTER — Encounter: Payer: Self-pay | Admitting: Family Medicine

## 2017-12-05 ENCOUNTER — Ambulatory Visit (INDEPENDENT_AMBULATORY_CARE_PROVIDER_SITE_OTHER): Payer: Medicare Other | Admitting: Family Medicine

## 2017-12-05 VITALS — BP 136/70 | HR 59 | Temp 97.5°F | Ht 64.5 in | Wt 216.2 lb

## 2017-12-05 DIAGNOSIS — Z1211 Encounter for screening for malignant neoplasm of colon: Secondary | ICD-10-CM | POA: Diagnosis not present

## 2017-12-05 DIAGNOSIS — D696 Thrombocytopenia, unspecified: Secondary | ICD-10-CM | POA: Diagnosis not present

## 2017-12-05 DIAGNOSIS — E7800 Pure hypercholesterolemia, unspecified: Secondary | ICD-10-CM | POA: Diagnosis not present

## 2017-12-05 DIAGNOSIS — Z0000 Encounter for general adult medical examination without abnormal findings: Secondary | ICD-10-CM | POA: Diagnosis not present

## 2017-12-05 DIAGNOSIS — I509 Heart failure, unspecified: Secondary | ICD-10-CM

## 2017-12-05 DIAGNOSIS — Z6836 Body mass index (BMI) 36.0-36.9, adult: Secondary | ICD-10-CM | POA: Diagnosis not present

## 2017-12-05 DIAGNOSIS — M8589 Other specified disorders of bone density and structure, multiple sites: Secondary | ICD-10-CM

## 2017-12-05 DIAGNOSIS — I10 Essential (primary) hypertension: Secondary | ICD-10-CM | POA: Diagnosis not present

## 2017-12-05 DIAGNOSIS — R739 Hyperglycemia, unspecified: Secondary | ICD-10-CM

## 2017-12-05 MED ORDER — CALCITONIN (SALMON) 200 UNIT/ACT NA SOLN: 1.0000 | Freq: Every day | NASAL | 11 refills | Status: DC

## 2017-12-05 MED ORDER — ATORVASTATIN CALCIUM 20 MG PO TABS
20.0000 mg | ORAL_TABLET | Freq: Every day | ORAL | 3 refills | Status: DC
Start: 2017-12-05 — End: 2018-11-01

## 2017-12-05 MED ORDER — AMLODIPINE BESYLATE 5 MG PO TABS: 5.0000 mg | ORAL_TABLET | Freq: Every day | ORAL | 3 refills | Status: DC

## 2017-12-05 MED ORDER — PANTOPRAZOLE SODIUM 40 MG PO TBEC: 40.0000 mg | DELAYED_RELEASE_TABLET | Freq: Every day | ORAL | 3 refills | Status: DC

## 2017-12-05 MED ORDER — LOSARTAN POTASSIUM-HCTZ 100-12.5 MG PO TABS: ORAL_TABLET | ORAL | 3 refills | Status: DC

## 2017-12-05 NOTE — Patient Instructions (Addendum)
Don't forget to schedule your mammogram after 2/12   For bones keep taking ca and D  Exercise is also important   For prevention of diabetes  Try to get most of your carbohydrates from produce (with the exception of white potatoes)  Eat less bread/pasta/rice/snack foods/cereals/sweets and other items from the middle of the grocery store (processed carbs)  Weight loss will also help   Take care of yourself

## 2017-12-05 NOTE — Progress Notes (Signed)
Subjective:    Patient ID: Natasha [name], female    DOB: 04-Apr-1946, 71 y.o.   MRN: [id number]  HPI .Here for health maintenance exam and to review chronic medical problems    Had amw on 12/17 Doing well /no gaps or concerns   Wt Readings from Last 3 Encounters:  12/05/17 216 lb 4 oz (98.1 kg)  11/26/17 213 lb 4 oz (96.7 kg)  10/23/17 214 lb 9.6 oz (97.3 kg)  up from the holidays /also wearing a coat  No regular exercise - "not interested in it" Eating - good / stays away from junk food  36.55 kg/m   Colon cancer screening  12/16 colonoscopy  cologuard 1/18 neg   Mammogram 2/18 Self breast exam - no lumps or changes    Pap 11/18 neg Had some post menopausal bleeding- had bx / it was normal  Does not know why  Will let us know if it happens again   dexa 2/18- fairly stable osteopenia  D level is 12/18  44.6  Not exercising  No falls  No fractures  On miacalcin   bp is stable today  No cp or palpitations or headaches or edema  No side effects to medicines  BP Readings from Last 3 Encounters:  12/05/17 136/70  11/26/17 124/70  10/23/17 (!) 145/75     Hx of low platelet ct Lab Results  Component Value Date   WBC 3.6 (L) 11/26/2017   HGB 14.8 11/26/2017   HCT 43.9 11/26/2017   MCV 89.0 11/26/2017   PLT 124.0 (L) 11/26/2017   no excess bruising or bleeding   Hyperglycemia Lab Results  Component Value Date   HGBA1C 5.9 11/26/2017  last year 5.8 Eats less sweets  No pasta  Not much bread  Not much rice  Uses sweet and low in tea    Hyperlipidemia  Lab Results  Component Value Date   CHOL 143 11/26/2017   CHOL 152 11/24/2016   CHOL 148 11/15/2015   Lab Results  Component Value Date   HDL 55.70 11/26/2017   HDL 51.60 11/24/2016   HDL 44.20 11/15/2015   Lab Results  Component Value Date   LDLCALC 70 11/26/2017   LDLCALC 72 11/24/2016   LDLCALC 76 11/15/2015   Lab Results  Component Value Date   TRIG 85.0 11/26/2017   TRIG 143.0  11/24/2016   TRIG 136.0 11/15/2015   Lab Results  Component Value Date   CHOLHDL 3 11/26/2017   CHOLHDL 3 11/24/2016   CHOLHDL 3 11/15/2015   No results found for: LDLDIRECT On atorvastatin and diet  Very good profile   Lab Results  Component Value Date   CREATININE 1.00 11/26/2017   BUN 19 11/26/2017   NA 142 11/26/2017   K 4.6 11/26/2017   CL 106 11/26/2017   CO2 31 11/26/2017   Lab Results  Component Value Date   ALT 16 11/26/2017   AST 18 11/26/2017   ALKPHOS 76 11/26/2017   BILITOT 0.8 11/26/2017    Patient Active Problem List   Diagnosis Date Noted  . Post-menopausal bleeding 10/22/2017  . Headache syndrome 04/30/2017  . Facial pain 04/11/2017  . Temple tenderness 04/11/2017  . Dermatitis 06/27/2016  . Routine general medical examination at a health care facility 11/22/2015  . Colon cancer screening 11/22/2015  . Special screening for malignant neoplasms, colon 11/17/2014  . Estrogen deficiency 11/17/2014  . Encounter for Medicare annual wellness exam 10/28/2013  . Hyperglycemia 10/28/2013  .  Obesity 10/28/2013  . Post-menopausal 09/24/2012  . Allergic rhinitis 04/26/2012  . Gynecologic exam normal 04/19/2011  . Osteopenia 11/10/2008  . THROMBOCYTOPENIA 08/11/2008  . POSTMENOPAUSAL STATUS 07/09/2008  . HYPERCHOLESTEROLEMIA 07/16/2007  . Essential hypertension 07/16/2007  . CORONARY ARTERY DISEASE 07/16/2007  . CONGESTIVE HEART FAILURE 07/16/2007  . DEGENERATIVE DISC DISEASE, LUMBAR SPINE 07/16/2007   Past Medical History:  Diagnosis Date  . CAD (coronary artery disease)    (4/05 carotid dopler- 39% stenosis) , (ECHO- mild LVH, EF 60%)  . CHF (congestive heart failure) ([place])   . DDD (degenerative disc disease), lumbar 11/2006   L5-S1 bulging disk  . Headache syndrome 04/30/2017   Left occipital and ear   . Hyperlipidemia   . Hypertension   . Osteopenia   . Thrombocytopenia ([place])    mild   Past Surgical History:  Procedure Laterality Date  .  BACK SURGERY  2004   fall at work  . BREAST BIOPSY Right 2017   core- neg  . CHOLECYSTECTOMY    . SHOULDER SURGERY  2004   fall  . TUBAL LIGATION     Social History   Tobacco Use  . Smoking status: Never Smoker  . Smokeless tobacco: Never Used  Substance Use Topics  . Alcohol use: No    Alcohol/week: 0.0 oz  . Drug use: No   Family History  Problem Relation Age of Onset  . Coronary artery disease Father   . Lung cancer Sister   . Breast cancer Maternal Grandmother   . Diabetes Sister   . Other Sister        OP  . Other Mother        has pacemaker   Allergies  Allergen Reactions  . Naproxen Sodium     REACTION: stomach pain  . Clindamycin/Lincomycin Itching and Rash   Current Outpatient Medications on File Prior to Visit  Medication Sig Dispense Refill  . aspirin 81 MG tablet Take 81 mg by mouth daily.      . Cholecalciferol (VITAMIN D-3) 1000 UNITS CAPS Take 2 tablets by mouth daily.     Marland Kitchen gabapentin (NEURONTIN) 300 MG capsule Take 2 capsules (600 mg total) by mouth at bedtime. 60 capsule 11   No current facility-administered medications on file prior to visit.     Review of Systems  Constitutional: Negative for activity change, appetite change, fatigue, fever and unexpected weight change.  HENT: Negative for congestion, ear pain, rhinorrhea, sinus pressure and sore throat.   Eyes: Negative for pain, redness and visual disturbance.  Respiratory: Negative for cough, shortness of breath and wheezing.   Cardiovascular: Negative for chest pain and palpitations.  Gastrointestinal: Negative for abdominal pain, blood in stool, constipation and diarrhea.  Endocrine: Negative for polydipsia and polyuria.  Genitourinary: Negative for dysuria, frequency and urgency.  Musculoskeletal: Negative for arthralgias, back pain and myalgias.  Skin: Negative for pallor and rash.  Allergic/Immunologic: Negative for environmental allergies.  Neurological: Negative for dizziness,  syncope and headaches.  Hematological: Negative for adenopathy. Does not bruise/bleed easily.  Psychiatric/Behavioral: Negative for decreased concentration and dysphoric mood. The patient is not nervous/anxious.        Objective:   Physical Exam  Constitutional: She appears well-developed and well-nourished. No distress.  obese and well appearing   HENT:  Head: Normocephalic and atraumatic.  Right Ear: External ear normal.  Left Ear: External ear normal.  Mouth/Throat: Oropharynx is clear and moist.  Eyes: Conjunctivae and EOM are normal. Pupils are equal,  round, and reactive to light. No scleral icterus.  Neck: Normal range of motion. Neck supple. No JVD present. Carotid bruit is not present. No thyromegaly present.  Cardiovascular: Normal rate, regular rhythm, normal heart sounds and intact distal pulses. Exam reveals no gallop.  Pulmonary/Chest: Effort normal and breath sounds normal. No respiratory distress. She has no wheezes. She exhibits no tenderness.  Abdominal: Soft. Bowel sounds are normal. She exhibits no distension, no abdominal bruit and no mass. There is no tenderness.  Genitourinary: No breast swelling, tenderness, discharge or bleeding.  Genitourinary Comments: Breast exam: No mass, nodules, thickening, tenderness, bulging, retraction, inflamation, nipple discharge or skin changes noted.  No axillary or clavicular LA.      Musculoskeletal: Normal range of motion. She exhibits no edema or tenderness.  No kyphosis   Lymphadenopathy:    She has no cervical adenopathy.  Neurological: She is alert. She has normal reflexes. No cranial nerve deficit. She exhibits normal muscle tone. Coordination normal.  Skin: Skin is warm and dry. No rash noted. No erythema. No pallor.  Solar lentigines diffusely  Some SKs  Psychiatric: She has a normal mood and affect.          Assessment & Plan:   Problem List Items Addressed This Visit      Cardiovascular and Mediastinum    Congestive heart failure (HCC)    No symptoms or problems       Relevant Medications   amLODipine (NORVASC) 5 MG tablet   atorvastatin (LIPITOR) 20 MG tablet   losartan-hydrochlorothiazide (HYZAAR) 100-12.5 MG tablet   Essential hypertension    bp in fair control at this time  BP Readings from Last 1 Encounters:  12/05/17 136/70   No changes needed Disc lifstyle change with low sodium diet and exercise  Labs reviewed       Relevant Medications   amLODipine (NORVASC) 5 MG tablet   atorvastatin (LIPITOR) 20 MG tablet   losartan-hydrochlorothiazide (HYZAAR) 100-12.5 MG tablet     Musculoskeletal and Integument   Osteopenia    Stable 2/18 dexa  D level is therapeutic  Disc exercise  No fx or falls/ on miacalcin  Enc ca and D Re check 2 y        Other   Colon cancer screening    cologuard neg 1/18  Reassuring  Can rep in at 3 y      HYPERCHOLESTEROLEMIA    Disc goals for lipids and reasons to control them Rev labs with pt Rev low sat fat diet in detail Well controlled with atorvastatin and diet        Relevant Medications   amLODipine (NORVASC) 5 MG tablet   atorvastatin (LIPITOR) 20 MG tablet   losartan-hydrochlorothiazide (HYZAAR) 100-12.5 MG tablet   Hyperglycemia    Lab Results  Component Value Date   HGBA1C 5.9 11/26/2017   disc imp of low glycemic diet and wt loss to prevent DM2       Obesity    Discussed how this problem influences overall health and the risks it imposes  Reviewed plan for weight loss with lower calorie diet (via better food choices and also portion control or program like weight watchers) and exercise building up to or more than 30 minutes 5 days per week including some aerobic activity         Routine general medical examination at a health care facility - Primary    Reviewed health habits including diet and exercise and skin cancer prevention Reviewed  appropriate screening tests for age  Also reviewed health mt list, fam hx  and immunization status , as well as social and family history   See HPI Labs reviewed  amw reviewed Pt will schedule her own mammogram  Medicines refilled  Disc plan for wt loss and enc exercise as tolerated        THROMBOCYTOPENIA    Platelet ct 124 w/o change in bleeding or bruising  Will continue to watch

## 2017-12-06 NOTE — Assessment & Plan Note (Signed)
No symptoms or problems

## 2017-12-06 NOTE — Assessment & Plan Note (Signed)
Discussed how this problem influences overall health and the risks it imposes  Reviewed plan for weight loss with lower calorie diet (via better food choices and also portion control or program like weight watchers) and exercise building up to or more than 30 minutes 5 days per week including some aerobic activity    

## 2017-12-06 NOTE — Assessment & Plan Note (Signed)
Stable 2/18 dexa  D level is therapeutic  Disc exercise  No fx or falls/ on miacalcin  Enc ca and D Re check 2 y

## 2017-12-06 NOTE — Assessment & Plan Note (Signed)
Reviewed health habits including diet and exercise and skin cancer prevention Reviewed appropriate screening tests for age  Also reviewed health mt list, fam hx and immunization status , as well as social and family history   See HPI Labs reviewed  amw reviewed Pt will schedule her own mammogram  Medicines refilled  Disc plan for wt loss and enc exercise as tolerated

## 2017-12-06 NOTE — Assessment & Plan Note (Signed)
cologuard neg 1/18  Reassuring  Can rep in at 3 y

## 2017-12-06 NOTE — Assessment & Plan Note (Signed)
Platelet ct 124 w/o change in bleeding or bruising  Will continue to watch

## 2017-12-06 NOTE — Assessment & Plan Note (Signed)
Disc goals for lipids and reasons to control them Rev labs with pt Rev low sat fat diet in detail Well controlled with atorvastatin and diet

## 2017-12-06 NOTE — Assessment & Plan Note (Signed)
bp in fair control at this time  BP Readings from Last 1 Encounters:  12/05/17 136/70   No changes needed Disc lifstyle change with low sodium diet and exercise  Labs reviewed

## 2017-12-06 NOTE — Assessment & Plan Note (Signed)
Lab Results  Component Value Date   HGBA1C 5.9 11/26/2017   disc imp of low glycemic diet and wt loss to prevent DM2

## 2017-12-17 ENCOUNTER — Other Ambulatory Visit: Payer: Self-pay | Admitting: Family Medicine

## 2017-12-17 DIAGNOSIS — Z1231 Encounter for screening mammogram for malignant neoplasm of breast: Secondary | ICD-10-CM

## 2018-01-22 ENCOUNTER — Ambulatory Visit
Admission: RE | Admit: 2018-01-22 | Discharge: 2018-01-22 | Disposition: A | Discharge status: Home / Self Care | Payer: Medicare Other | Source: Ambulatory Visit | Attending: Family Medicine | Admitting: Family Medicine

## 2018-01-22 DIAGNOSIS — Z1231 Encounter for screening mammogram for malignant neoplasm of breast: Secondary | ICD-10-CM

## 2018-02-07 ENCOUNTER — Ambulatory Visit (INDEPENDENT_AMBULATORY_CARE_PROVIDER_SITE_OTHER): Payer: Medicare Other | Admitting: Adult Health

## 2018-02-07 ENCOUNTER — Encounter: Payer: Self-pay | Admitting: Adult Health

## 2018-02-07 VITALS — BP 146/78 | HR 60 | Ht 64.5 in | Wt 222.6 lb

## 2018-02-07 DIAGNOSIS — G4489 Other headache syndrome: Secondary | ICD-10-CM

## 2018-02-07 NOTE — Progress Notes (Signed)
I have read the note, and I agree with the clinical assessment and plan.  Deniz Hannan K Ahron Hulbert   

## 2018-02-07 NOTE — Progress Notes (Signed)
PATIENT: Natasha [name] DOB: 1946-06-06  REASON FOR VISIT: follow up- headache HISTORY FROM: patient  HISTORY OF PRESENT ILLNESS: Today 02/07/18 Natasha [name] is a 72 year old female with a history of headaches.  She returns today for follow-up.  She is currently on gabapentin taking 600 mg at bedtime.  She feels that this continues to be beneficial.  She states for months she has no pain and other months it may be more frequent.  She states that the pain still occurs in the left jaw.  She states that it is a sharp shooting pain that only lasts for seconds and then resolves.  She denies any associated symptoms such as numbness or tingling.  She returns today for evaluation.   HISTORY 08/01/17 Natasha [name] is a 72 year old female with a history of headaches. She returns today for follow-up. At the last visit she was placed on gabapentin 600 mg at bedtime. She reports that this has been beneficial for her headaches. She states in the month of June she had approximately 14 days where she was woken up with this pain. She states in July she had 11 days and in August she only had 4 days. She states that the sharp shooting pain is always in the left jaw area. She states it can last anywhere from 30 seconds to 1 minute. At her first  visit with [name] she was also having numbness on the left side of the head as well as neck stiffness, she states that both of these symptoms have resolved. She states that she never had this pain during the day. She is tolerating gabapentin well. She returns today for follow-up.  REVIEW OF SYSTEMS: Out of a complete 14 system review of symptoms, the patient complains only of the following symptoms, and all other reviewed systems are negative.  See HPI  ALLERGIES: Allergies  Allergen Reactions  . Naproxen Sodium     REACTION: stomach pain  . Clindamycin/Lincomycin Itching and Rash    HOME MEDICATIONS: Outpatient Medications Prior to Visit  Medication Sig Dispense  Refill  . amLODipine (NORVASC) 5 MG tablet Take 1 tablet (5 mg total) by mouth daily. 90 tablet 3  . aspirin 81 MG tablet Take 81 mg by mouth daily.      Marland Kitchen atorvastatin (LIPITOR) 20 MG tablet Take 1 tablet (20 mg total) by mouth daily. 90 tablet 3  . calcitonin, salmon, (MIACALCIN/FORTICAL) 200 UNIT/ACT nasal spray Place 1 spray into alternate nostrils daily. 3.7 mL 11  . Cholecalciferol (VITAMIN D-3) 1000 UNITS CAPS Take 2 tablets by mouth daily.     Marland Kitchen gabapentin (NEURONTIN) 300 MG capsule Take 2 capsules (600 mg total) by mouth at bedtime. 60 capsule 11  . losartan-hydrochlorothiazide (HYZAAR) 100-12.5 MG tablet TAKE 1/2 TABLET EVERYDAY 45 tablet 3  . pantoprazole (PROTONIX) 40 MG tablet Take 1 tablet (40 mg total) by mouth daily. 90 tablet 3   No facility-administered medications prior to visit.     PAST MEDICAL HISTORY: Past Medical History:  Diagnosis Date  . CAD (coronary artery disease)    (4/05 carotid dopler- 39% stenosis) , (ECHO- mild LVH, EF 60%)  . CHF (congestive heart failure) ([place])   . DDD (degenerative disc disease), lumbar 11/2006   L5-S1 bulging disk  . Headache syndrome 04/30/2017   Left occipital and ear   . Hyperlipidemia   . Hypertension   . Osteopenia   . 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 BACK SURGERY  2004   fall at work  . BREAST BIOPSY Right 2017   core- neg  . CHOLECYSTECTOMY    . SHOULDER SURGERY  2004   fall  . TUBAL LIGATION      FAMILY HISTORY: Family History  Problem Relation Age of Onset  . Coronary artery disease Father   . Lung cancer Sister   . Breast cancer Maternal Grandmother   . Diabetes Sister   . Other Sister        OP  . Other Mother        has pacemaker    SOCIAL HISTORY: Social History   Socioeconomic History  . Marital status: Married    Spouse name: Natasha [name]  . Number of children: 4  . Years of education: 65  . Highest education  level: Not on file  Social Needs  . Financial resource strain: Not on file  . Food insecurity - worry: Not on file  . Food insecurity - inability: Not on file  . Transportation needs - medical: Not on file  . Transportation needs - non-medical: Not on file  Occupational History  . Occupation: Pharmacologist- Retired    Fish farm manager: [employer]  Tobacco Use  . Smoking status: Never Smoker  . Smokeless tobacco: Never Used  Substance and Sexual Activity  . Alcohol use: No    Alcohol/week: 0.0 oz  . Drug use: No  . Sexual activity: Not on file  Other Topics Concern  . Not on file  Social History Narrative   Lives with husband, Natasha [name]   Caffeine use: 1 cup coffee every morning   Soda rare   4 daughters      PHYSICAL EXAM  Vitals:   02/07/18 1050  BP: (!) 146/78  Pulse: 60  SpO2: 96%  Weight: 222 lb 9.6 oz (101 kg)  Height: 5' 4.5" (1.638 m)   Body mass index is 37.62 kg/m.  Generalized: Well developed, in no acute distress   Neurological examination  Mentation: Alert oriented to time, place, history taking. Follows all commands speech and language fluent Cranial nerve II-XII: Pupils were equal round reactive to light. Extraocular movements were full, visual field were full on confrontational test. Facial sensation and strength were normal. Uvula tongue midline. Head turning and shoulder shrug  were normal and symmetric. Motor: The motor testing reveals 5 over 5 strength of all 4 extremities. Good symmetric motor tone is noted throughout.  Sensory: Sensory testing is intact to soft touch on all 4 extremities. No evidence of extinction is noted.  Coordination: Cerebellar testing reveals good finger-nose-finger and heel-to-shin bilaterally.  Gait and station: Gait is normal.  Reflexes: Deep tendon reflexes are symmetric and normal bilaterally.   DIAGNOSTIC DATA (LABS, IMAGING, TESTING) - I reviewed patient records, labs, notes, testing and imaging myself where  available.  Lab Results  Component Value Date   WBC 3.6 (L) 11/26/2017   HGB 14.8 11/26/2017   HCT 43.9 11/26/2017   MCV 89.0 11/26/2017   PLT 124.0 (L) 11/26/2017      Component Value Date/Time   NA 142 11/26/2017 0958   NA 140 07/11/2014 0942   NA 142 07/08/2009 0822   K 4.6 11/26/2017 0958   K 4.2 07/11/2014 0942   K 4.4 07/08/2009 0822   CL 106 11/26/2017 0958   CL 106 07/11/2014 0942   CL 104 07/08/2009 0822   CO2 31 11/26/2017 0958   CO2 28  07/11/2014 0942   CO2 31 07/08/2009 0822   GLUCOSE 99 11/26/2017 0958   GLUCOSE 128 (H) 07/11/2014 0942   GLUCOSE 108 07/08/2009 0822   BUN 19 11/26/2017 0958   BUN 13 07/11/2014 0942   BUN 16 07/08/2009 0822   CREATININE 1.00 11/26/2017 0958   CREATININE 0.94 07/11/2014 0942   CREATININE 0.9 07/08/2009 0822   CALCIUM 9.8 11/26/2017 0958   CALCIUM 9.8 07/11/2014 0942   CALCIUM 9.5 07/08/2009 0822   PROT 6.9 11/26/2017 0958   PROT 8.0 07/11/2014 0942   PROT 7.1 07/08/2009 0822   ALBUMIN 4.2 11/26/2017 0958   ALBUMIN 4.1 07/11/2014 0942   AST 18 11/26/2017 0958   AST 31 07/11/2014 0942   AST 26 07/08/2009 0822   ALT 16 11/26/2017 0958   ALT 24 07/11/2014 0942   ALT 21 07/08/2009 0822   ALKPHOS 76 11/26/2017 0958   ALKPHOS 94 07/11/2014 0942   ALKPHOS 69 07/08/2009 0822   BILITOT 0.8 11/26/2017 0958   BILITOT 0.8 07/11/2014 0942   BILITOT 0.80 07/08/2009 0822   GFRNONAA >60 07/11/2014 0942   GFRAA >60 07/11/2014 0942   Lab Results  Component Value Date   CHOL 143 11/26/2017   HDL 55.70 11/26/2017   LDLCALC 70 11/26/2017   TRIG 85.0 11/26/2017   CHOLHDL 3 11/26/2017   Lab Results  Component Value Date   HGBA1C 5.9 11/26/2017    Lab Results  Component Value Date   TSH 2.39 11/26/2017      ASSESSMENT AND PLAN 72 y.o. year old female  has a past medical history of CAD (coronary artery disease), CHF (congestive heart failure) ([place]), DDD (degenerative disc disease), lumbar (11/2006), Headache syndrome  (04/30/2017), Hyperlipidemia, Hypertension, Osteopenia, and Thrombocytopenia ([place]). here with:  1.  Headache  Overall the patient has remained stable.  She will continue on gabapentin 600 mg at bedtime.  I have advised that if her symptoms worsen or she develops new symptoms she should let me know.  She will follow-up in 1 year or sooner if needed.  I spent 15 minutes with the patient. 50% of this time was spent reviewing medication   Ward Givens, MSN, NP-C 02/07/2018, 11:00 AM [facility] [address] [place], [place] [address] [phone]

## 2018-02-07 NOTE — Patient Instructions (Signed)
Your Plan:  Continue gabapentin 600 mg at bedtime If your symptoms worsen or you develop new symptoms please let us know.   Thank you for coming to see [place] at [facility]. I hope we have been able to provide you high quality care today.  You may receive a patient satisfaction survey over the next few weeks. We would appreciate your feedback and comments so that we may continue to improve ourselves and the health of our patients.

## 2018-02-21 ENCOUNTER — Ambulatory Visit: Payer: Medicare Other | Admitting: Internal Medicine

## 2018-02-21 ENCOUNTER — Encounter: Payer: Self-pay | Admitting: Internal Medicine

## 2018-02-21 VITALS — BP 142/84 | HR 63 | Temp 97.9°F | Wt 219.0 lb

## 2018-02-21 DIAGNOSIS — R059 Cough, unspecified: Secondary | ICD-10-CM

## 2018-02-21 DIAGNOSIS — R05 Cough: Secondary | ICD-10-CM

## 2018-02-21 DIAGNOSIS — R0982 Postnasal drip: Secondary | ICD-10-CM

## 2018-02-21 MED ORDER — HYDROCOD POLST-CPM POLST ER 10-8 MG/5ML PO SUER: 5.0000 mL | Freq: Every evening | ORAL | 0 refills | Status: DC | PRN

## 2018-02-21 MED ORDER — METHYLPREDNISOLONE ACETATE 80 MG/ML IJ SUSP
80.0000 mg | Freq: Once | INTRAMUSCULAR | Status: AC
  Administered 2018-02-21: 80 mg via INTRAMUSCULAR

## 2018-02-21 NOTE — Patient Instructions (Signed)

## 2018-02-21 NOTE — Progress Notes (Signed)
HPI  Pt presents to the clinic today with c/o sore throat and cough. This started 3 days ago. She denies difficulty swallowing. The cough is non productive. She denies fever, but has had some chills. She denies runny nose, nasal congestion, ear pain or shortness of breath. She has tried Robitussin with some relief. She has no history of allergies. She has had sick contacts.  Review of Systems      Past Medical History:  Diagnosis Date  . CAD (coronary artery disease)    (4/05 carotid dopler- 39% stenosis) , (ECHO- mild LVH, EF 60%)  . CHF (congestive heart failure) ([place])   . DDD (degenerative disc disease), lumbar 11/2006   L5-S1 bulging disk  . Headache syndrome 04/30/2017   Left occipital and ear   . Hyperlipidemia   . Hypertension   . Osteopenia   . Thrombocytopenia (HCC)    mild    Family History  Problem Relation Age of Onset  . Coronary artery disease Father   . Lung cancer Sister   . Breast cancer Maternal Grandmother   . Diabetes Sister   . Other Sister        OP  . Other Mother        has pacemaker    Social History   Socioeconomic History  . Marital status: Married    Spouse name: Caylei Sperry  . Number of children: 4  . Years of education: 69  . Highest education level: Not on file  Social Needs  . Financial resource strain: Not on file  . Food insecurity - worry: Not on file  . Food insecurity - inability: Not on file  . Transportation needs - medical: Not on file  . Transportation needs - non-medical: Not on file  Occupational History  . Occupation: Pharmacologist- Retired    Fish farm manager: [employer]  Tobacco Use  . Smoking status: Never Smoker  . Smokeless tobacco: Never Used  Substance and Sexual Activity  . Alcohol use: No    Alcohol/week: 0.0 oz  . Drug use: No  . Sexual activity: Not on file  Other Topics Concern  . Not on file  Social History Narrative   Lives with husband, Fritz Pickerel   Caffeine use: 1 cup coffee every morning   Soda rare    4 daughters    Allergies  Allergen Reactions  . Naproxen Sodium     REACTION: stomach pain  . Clindamycin/Lincomycin Itching and Rash     Constitutional:  Denies headache, fatigue, fever or abrupt weight changes.  HEENT:  Positive sore throat. Denies eye redness, eye pain, pressure behind the eyes, facial pain, nasal congestion, ear pain, ringing in the ears, wax buildup, runny nose or bloody nose. Respiratory: Positive cough. Denies difficulty breathing or shortness of breath.  Cardiovascular: Denies chest pain, chest tightness, palpitations or swelling in the hands or feet.   No other specific complaints in a complete review of systems (except as listed in HPI above).  Objective:   BP (!) 142/84   Pulse 63   Temp 97.9 F (36.6 C) (Oral)   Wt 219 lb (99.3 kg)   LMP 12/11/1993   SpO2 98%   BMI 37.01 kg/m  Wt Readings from Last 3 Encounters:  02/21/18 219 lb (99.3 kg)  02/07/18 222 lb 9.6 oz (101 kg)  12/05/17 216 lb 4 oz (98.1 kg)     General: Appears her stated age, in NAD. HEENT: Head: normal shape and size, no sinus tenderness noted; Throat/Mouth: +  PND. Teeth present, mucosa pink and moist, no exudate noted, no lesions or ulcerations noted.  Neck: No cervical lymphadenopathy.  Pulmonary/Chest: Normal effort and positive vesicular breath sounds. No respiratory distress. No wheezes, rales or ronchi noted.       Assessment & Plan:   Cough, secondary to PND:  Get some rest and drink plenty of water Do salt water gargles for the sore throat 80 mg Depo IM today Start Allegra daily x 1-2 weeks eRx for Tussionex cough syrup  RTC as needed or if symptoms persist.   Webb Silversmith, NP

## 2018-02-21 NOTE — Addendum Note (Signed)
Addended by: Lurlean Nanny on: 02/21/2018 09:28 AM   Modules accepted: Orders

## 2018-09-11 ENCOUNTER — Other Ambulatory Visit: Payer: Self-pay | Admitting: Family Medicine

## 2018-11-01 ENCOUNTER — Other Ambulatory Visit: Payer: Self-pay | Admitting: Family Medicine

## 2018-11-26 ENCOUNTER — Telehealth: Payer: Self-pay | Admitting: Family Medicine

## 2018-11-26 DIAGNOSIS — R739 Hyperglycemia, unspecified: Secondary | ICD-10-CM

## 2018-11-26 DIAGNOSIS — D696 Thrombocytopenia, unspecified: Secondary | ICD-10-CM

## 2018-11-26 DIAGNOSIS — I10 Essential (primary) hypertension: Secondary | ICD-10-CM

## 2018-11-26 DIAGNOSIS — E7800 Pure hypercholesterolemia, unspecified: Secondary | ICD-10-CM

## 2018-11-26 NOTE — Telephone Encounter (Signed)
-----   Message from Eustace Pen, LPN sent at 44/17/1278  3:27 PM EST ----- Regarding: Labs 12/18 Lab orders needed. Thank you.  Insurance:  [facility] Medicare

## 2018-11-27 ENCOUNTER — Ambulatory Visit (INDEPENDENT_AMBULATORY_CARE_PROVIDER_SITE_OTHER): Payer: Medicare Other

## 2018-11-27 ENCOUNTER — Ambulatory Visit: Payer: Medicare Other

## 2018-11-27 VITALS — BP 122/80 | HR 55 | Temp 97.6°F | Ht 65.5 in | Wt 214.5 lb

## 2018-11-27 DIAGNOSIS — I10 Essential (primary) hypertension: Secondary | ICD-10-CM | POA: Diagnosis not present

## 2018-11-27 DIAGNOSIS — D696 Thrombocytopenia, unspecified: Secondary | ICD-10-CM | POA: Diagnosis not present

## 2018-11-27 DIAGNOSIS — R739 Hyperglycemia, unspecified: Secondary | ICD-10-CM | POA: Diagnosis not present

## 2018-11-27 DIAGNOSIS — Z0000 Encounter for general adult medical examination without abnormal findings: Secondary | ICD-10-CM | POA: Diagnosis not present

## 2018-11-27 DIAGNOSIS — E7800 Pure hypercholesterolemia, unspecified: Secondary | ICD-10-CM | POA: Diagnosis not present

## 2018-11-27 LAB — CBC WITH DIFFERENTIAL/PLATELET
Basophils Absolute: 0.1 10*3/uL (ref 0.0–0.1)
Basophils Relative: 1.3 % (ref 0.0–3.0)
Eosinophils Absolute: 0.2 10*3/uL (ref 0.0–0.7)
Eosinophils Relative: 3.7 % (ref 0.0–5.0)
HCT: 44.5 % (ref 36.0–46.0)
Hemoglobin: 15 g/dL (ref 12.0–15.0)
Lymphocytes Relative: 32.7 % (ref 12.0–46.0)
Lymphs Abs: 1.4 10*3/uL (ref 0.7–4.0)
MCHC: 33.8 g/dL (ref 30.0–36.0)
MCV: 87.7 fl (ref 78.0–100.0)
Monocytes Absolute: 0.4 10*3/uL (ref 0.1–1.0)
Monocytes Relative: 8.3 % (ref 3.0–12.0)
Neutro Abs: 2.3 10*3/uL (ref 1.4–7.7)
Neutrophils Relative %: 54 % (ref 43.0–77.0)
Platelets: 131 10*3/uL — ABNORMAL LOW (ref 150.0–400.0)
RBC: 5.07 Mil/uL (ref 3.87–5.11)
RDW: 13.2 % (ref 11.5–15.5)
WBC: 4.4 10*3/uL (ref 4.0–10.5)

## 2018-11-27 LAB — COMPREHENSIVE METABOLIC PANEL
ALT: 14 U/L (ref 0–35)
AST: 15 U/L (ref 0–37)
Albumin: 4.5 g/dL (ref 3.5–5.2)
Alkaline Phosphatase: 81 U/L (ref 39–117)
BUN: 19 mg/dL (ref 6–23)
CO2: 32 mEq/L (ref 19–32)
Calcium: 10.6 mg/dL — ABNORMAL HIGH (ref 8.4–10.5)
Chloride: 105 mEq/L (ref 96–112)
Creatinine, Ser: 1.12 mg/dL (ref 0.40–1.20)
GFR: 50.7 mL/min — ABNORMAL LOW (ref >60.00)
Glucose, Bld: 117 mg/dL — ABNORMAL HIGH (ref 70–99)
Potassium: 4.4 mEq/L (ref 3.5–5.1)
Sodium: 143 mEq/L (ref 135–145)
Total Bilirubin: 0.7 mg/dL (ref 0.2–1.2)
Total Protein: 7.2 g/dL (ref 6.0–8.3)

## 2018-11-27 LAB — HEMOGLOBIN A1C: Hgb A1c MFr Bld: 5.9 % (ref 4.6–6.5)

## 2018-11-27 LAB — LIPID PANEL
Cholesterol: 152 mg/dL (ref 0–200)
HDL: 51.8 mg/dL (ref >39.00)
LDL Cholesterol: 71 mg/dL (ref 0–99)
NonHDL: 100.57
Total CHOL/HDL Ratio: 3
Triglycerides: 149 mg/dL (ref 0.0–149.0)
VLDL: 29.8 mg/dL (ref 0.0–40.0)

## 2018-11-27 LAB — TSH: TSH: 3.87 u[IU]/mL (ref 0.35–4.50)

## 2018-11-27 NOTE — Progress Notes (Signed)
PCP notes:   Health maintenance:  No gaps identified  Abnormal screenings:   Mini-Cog score: 18/20 MMSE - Mini Mental State Exam 11/27/2018 11/26/2017 11/24/2016  Orientation to time 5 5 5   Orientation to Place 5 5 5   Registration 3 3 3   Attention/ Calculation 0 0 0  Recall 1 3 3   Recall-comments unable to recall 2 of 3 words - -  Language- name 2 objects 0 0 0  Language- repeat 1 1 1   Language- follow 3 step command 3 3 3   Language- read & follow direction 0 0 0  Write a sentence 0 0 0  Copy design 0 0 0  Total score 18 20 20       Fall risk - hx of single fall Fall Risk  11/27/2018 11/26/2017 11/24/2016 11/22/2015 11/17/2014  Falls in the past year? 1 No No No No  Comment fell after missing a step; bruise to right hip - - - -  Number falls in past yr: 0 - - - -  Injury with Fall? 1 - - - -   Patient concerns:   None  Nurse concerns:  None  Next PCP appt:   12/06/18 @ 0830  I reviewed health advisor's note, was available for consultation, and agree with documentation and plan. Loura Pardon MD

## 2018-11-27 NOTE — Patient Instructions (Signed)
[name] , Thank you for taking time to come for your Medicare Wellness Visit. I appreciate your ongoing commitment to your health goals. Please review the following plan we discussed and let me know if I can assist you in the future.   These are the goals we discussed: Goals    . Follow up with Primary Care Provider     Starting 11/27/2018, I will continue to take medications as prescribed and to follow up with PCP as scheduled.        This is a list of the screening recommended for you and due dates:  Health Maintenance  Topic Date Due  . Stool Blood Test  12/27/2019*  . Mammogram  01/22/2019  . Cologuard (Stool DNA test)  12/27/2019  . Tetanus Vaccine  03/06/2024  . Flu Shot  Completed  . DEXA scan (bone density measurement)  Completed  .  Hepatitis C: One time screening is recommended by Center for Disease Control  (CDC) for  adults born from [birth year] through 1965.   Completed  . Pneumonia vaccines  Completed  . Pap Smear  Discontinued  *Topic was postponed. The date shown is not the original due date.   Preventive Care for Adults  A healthy lifestyle and preventive care can promote health and wellness. Preventive health guidelines for adults include the following key practices.  . A routine yearly physical is a good way to check with your health care provider about your health and preventive screening. It is a chance to share any concerns and updates on your health and to receive a thorough exam.  . Visit your dentist for a routine exam and preventive care every 6 months. Brush your teeth twice a day and floss once a day. Good oral hygiene prevents tooth decay and gum disease.  . The frequency of eye exams is based on your age, health, family medical history, use  of contact lenses, and other factors. Follow your health care provider's recommendations for frequency of eye exams.  . Eat a healthy diet. Foods like vegetables, fruits, whole grains, low-fat dairy products, and  lean protein foods contain the nutrients you need without too many calories. Decrease your intake of foods high in solid fats, added sugars, and salt. Eat the right amount of calories for you. Get information about a proper diet from your health care provider, if necessary.  . Regular physical exercise is one of the most important things you can do for your health. Most adults should get at least 150 minutes of moderate-intensity exercise (any activity that increases your heart rate and causes you to sweat) each week. In addition, most adults need muscle-strengthening exercises on 2 or more days a week.  Silver Sneakers may be a benefit available to you. To determine eligibility, you may visit the website: www.silversneakers.com or contact program at (937)574-9081 Mon-Fri between 8AM-8PM.   . Maintain a healthy weight. The body mass index (BMI) is a screening tool to identify possible weight problems. It provides an estimate of body fat based on height and weight. Your health care provider can find your BMI and can help you achieve or maintain a healthy weight.   For adults 20 years and older: ? A BMI below 18.5 is considered underweight. ? A BMI of 18.5 to 24.9 is normal. ? A BMI of 25 to 29.9 is considered overweight. ? A BMI of 30 and above is considered obese.   . Maintain normal blood lipids and cholesterol levels by exercising  and minimizing your intake of saturated fat. Eat a balanced diet with plenty of fruit and vegetables. Blood tests for lipids and cholesterol should begin at age 6 and be repeated every 5 years. If your lipid or cholesterol levels are high, you are over 50, or you are at high risk for heart disease, you may need your cholesterol levels checked more frequently. Ongoing high lipid and cholesterol levels should be treated with medicines if diet and exercise are not working.  . If you smoke, find out from your health care provider how to quit. If you do not use tobacco,  please do not start.  . If you choose to drink alcohol, please do not consume more than 2 drinks per day. One drink is considered to be 12 ounces (355 mL) of beer, 5 ounces (148 mL) of wine, or 1.5 ounces (44 mL) of liquor.  . If you are 77-58 years old, ask your health care provider if you should take aspirin to prevent strokes.  . Use sunscreen. Apply sunscreen liberally and repeatedly throughout the day. You should seek shade when your shadow is shorter than you. Protect yourself by wearing long sleeves, pants, a wide-brimmed hat, and sunglasses year round, whenever you are outdoors.  . Once a month, do a whole body skin exam, using a mirror to look at the skin on your back. Tell your health care provider of new moles, moles that have irregular borders, moles that are larger than a pencil eraser, or moles that have changed in shape or color.

## 2018-11-27 NOTE — Progress Notes (Signed)
Subjective:   Natasha [name] is a 72 y.o. female who presents for Medicare Annual (Subsequent) preventive examination.  Review of Systems: N/A Cardiac Risk Factors include: advanced age (>34men, >53 women);obesity (BMI >30kg/m2);dyslipidemia;hypertension     Objective:     Vitals: BP 122/80 (BP Location: Right Arm, Patient Position: Sitting, Cuff Size: Large)   Pulse (!) 55   Temp 97.6 F (36.4 C) (Oral)   Ht 5' 5.5" (1.664 m) Comment: shoes  Wt 214 lb 8 oz (97.3 kg)   LMP 12/11/1993   SpO2 95%   BMI 35.15 kg/m   Body mass index is 35.15 kg/m.  Advanced Directives 11/27/2018 11/26/2017 11/24/2016  Does Patient Have a Medical Advance Directive? Yes Yes Yes  Type of Paramedic of [place];Living will [place];Living will [place];Living will  Copy of [place] in Chart? Yes - validated most recent copy scanned in chart (See row information) Yes Yes    Tobacco Social History   Tobacco Use  Smoking Status Never Smoker  Smokeless Tobacco Never Used     Counseling given: No   Clinical Intake:  Pre-visit preparation completed: Yes  Pain : No/denies pain Pain Score: 0-No pain     Nutritional Status: BMI > 30  Obese Nutritional Risks: None Diabetes: No  How often do you need to have someone help you when you read instructions, pamphlets, or other written materials from your doctor or pharmacy?: 1 - Never What is the last grade level you completed in school?: 12th grade  Interpreter Needed?: No  Comments: pt lives with spouse Information entered by :: LPinson, LPN  Past Medical History:  Diagnosis Date  . CAD (coronary artery disease)    (4/05 carotid dopler- 39% stenosis) , (ECHO- mild LVH, EF 60%)  . CHF (congestive heart failure) ([place])   . DDD (degenerative disc disease), lumbar 11/2006   L5-S1 bulging disk  . Headache syndrome 04/30/2017   Left occipital and ear   .  Hyperlipidemia   . Hypertension   . Osteopenia   . Thrombocytopenia ([place])    mild   Past Surgical History:  Procedure Laterality Date  . BACK SURGERY  2004   fall at work  . BREAST BIOPSY Right 2017   core- neg  . CHOLECYSTECTOMY    . SHOULDER SURGERY  2004   fall  . TUBAL LIGATION     Family History  Problem Relation Age of Onset  . Coronary artery disease Father   . Lung cancer Sister   . Breast cancer Maternal Grandmother   . Diabetes Sister   . Other Sister        OP  . Other Mother        has pacemaker   Social History   Socioeconomic History  . Marital status: Married    Spouse name: Sherald Balbuena  . Number of children: 4  . Years of education: 52  . Highest education level: Not on file  Occupational History  . Occupation: Pharmacologist- Retired    Fish farm manager: [employer]  Social Needs  . Financial resource strain: Not on file  . Food insecurity:    Worry: Not on file    Inability: Not on file  . Transportation needs:    Medical: Not on file    Non-medical: Not on file  Tobacco Use  . Smoking status: Never Smoker  . Smokeless tobacco: Never Used  Substance and Sexual Activity  .  Alcohol use: No    Alcohol/week: 0.0 standard drinks  . Drug use: No  . Sexual activity: Not Currently  Lifestyle  . Physical activity:    Days per week: Not on file    Minutes per session: Not on file  . Stress: Not on file  Relationships  . Social connections:    Talks on phone: Not on file    Gets together: Not on file    Attends religious service: Not on file    Active member of club or organization: Not on file    Attends meetings of clubs or organizations: Not on file    Relationship status: Not on file  Other Topics Concern  . Not on file  Social History Narrative   Lives with husband, Fritz Pickerel   Caffeine use: 1 cup coffee every morning   Soda rare   4 daughters    Outpatient Encounter Medications as of 11/27/2018  Medication Sig  . amLODipine (NORVASC)  5 MG tablet TAKE 1 TABLET DAILY  . aspirin 81 MG tablet Take 81 mg by mouth daily.    Marland Kitchen atorvastatin (LIPITOR) 20 MG tablet TAKE 1 TABLET DAILY  . calcitonin, salmon, (MIACALCIN/FORTICAL) 200 UNIT/ACT nasal spray Place 1 spray into alternate nostrils daily.  . Cholecalciferol (VITAMIN D-3) 1000 UNITS CAPS Take 2 tablets by mouth daily.   Marland Kitchen losartan-hydrochlorothiazide (HYZAAR) 100-12.5 MG tablet TAKE 1/2 TABLET BY MOUTH DAILY  . pantoprazole (PROTONIX) 40 MG tablet TAKE 1 TABLET DAILY  . [DISCONTINUED] chlorpheniramine-HYDROcodone (TUSSIONEX PENNKINETIC ER) 10-8 MG/5ML SUER Take 5 mLs by mouth at bedtime as needed.  . [DISCONTINUED] gabapentin (NEURONTIN) 300 MG capsule Take 2 capsules (600 mg total) by mouth at bedtime.   No facility-administered encounter medications on file as of 11/27/2018.     Activities of Daily Living In your present state of health, do you have any difficulty performing the following activities: 11/27/2018  Hearing? N  Vision? N  Difficulty concentrating or making decisions? N  Walking or climbing stairs? N  Dressing or bathing? N  Doing errands, shopping? N  Preparing Food and eating ? N  Using the Toilet? N  In the past six months, have you accidently leaked urine? N  Do you have problems with loss of bowel control? N  Managing your Medications? N  Managing your Finances? N  Housekeeping or managing your Housekeeping? N  Some recent data might be hidden    Patient Care Team: Tower, Wynelle Fanny, MD as PCP - General Kerin Ransom, Stephannie Li, OD as Consulting Physician (Optometry) Kathrynn Ducking, MD as Consulting Physician (Neurology) Altha Harm, [place] as Referring Physician (Dentistry)    Assessment:   This is a routine wellness examination for Natasha [name].   Hearing Screening   125Hz  250Hz  500Hz  1000Hz  2000Hz  3000Hz  4000Hz  6000Hz  8000Hz   Right ear:   40 40 40  40    Left ear:   40 40 40  40    Vision Screening Comments: Vision exam approx. 2 yrs  ago   Exercise Activities and Dietary recommendations Exercise limited by: None identified  Goals    . Follow up with Primary Care Provider     Starting 11/27/2018, I will continue to take medications as prescribed and to follow up with PCP as scheduled.        Fall Risk Fall Risk  11/27/2018 11/26/2017 11/24/2016 11/22/2015 11/17/2014  Falls in the past year? 1 No No No No  Comment fell after missing a step; bruise to right  hip - - - -  Number falls in past yr: 0 - - - -  Injury with Fall? 1 - - - -   Depression Screen PHQ 2/9 Scores 11/27/2018 11/26/2017 11/24/2016 11/22/2015  PHQ - 2 Score 0 0 0 0  PHQ- 9 Score 0 0 - -     Cognitive Function MMSE - Mini Mental State Exam 11/27/2018 11/26/2017 11/24/2016  Orientation to time 5 5 5   Orientation to Place 5 5 5   Registration 3 3 3   Attention/ Calculation 0 0 0  Recall 1 3 3   Recall-comments unable to recall 2 of 3 words - -  Language- name 2 objects 0 0 0  Language- repeat 1 1 1   Language- follow 3 step command 3 3 3   Language- read & follow direction 0 0 0  Write a sentence 0 0 0  Copy design 0 0 0  Total score 18 20 20      PLEASE NOTE: A Mini-Cog screen was completed. Maximum score is 20. A value of 0 denotes this part of [place] MMSE was not completed or the patient failed this part of the Mini-Cog screening.   Mini-Cog Screening Orientation to Time - Max 5 pts Orientation to Place - Max 5 pts Registration - Max 3 pts Recall - Max 3 pts Language Repeat - Max 1 pts Language Follow 3 Step Command - Max 3 pts     Immunization History  Administered Date(s) Administered  . Influenza Split 09/24/2012  . Influenza, High Dose Seasonal PF 10/03/2016, 09/25/2018  . Influenza,inj,Quad PF,6+ Mos 10/28/2013, 10/14/2014  . Influenza-Unspecified 09/06/2015, 09/11/2017  . Pneumococcal Conjugate-13 11/17/2014  . Pneumococcal Polysaccharide-23 10/28/2013  . Td 12/11/2002  . Tdap 03/06/2014    Screening Tests Health  Maintenance  Topic Date Due  . COLON CANCER SCREENING ANNUAL FOBT  12/27/2019 (Originally 12/02/2016)  . MAMMOGRAM  01/22/2019  . Fecal DNA (Cologuard)  12/27/2019  . TETANUS/TDAP  03/06/2024  . INFLUENZA VACCINE  Completed  . DEXA SCAN  Completed  . Hepatitis C Screening  Completed  . PNA vac Low Risk Adult  Completed  . PAP SMEAR-Modifier  Discontinued      Plan:     I have personally reviewed, addressed, and noted the following in the patient's chart:  A. Medical and social history B. Use of alcohol, tobacco or illicit drugs  C. Current medications and supplements D. Functional ability and status E.  Nutritional status F.  Physical activity G. Advance directives H. List of other physicians I.  Hospitalizations, surgeries, and ER visits in previous 12 months J.  [place] to include hearing, vision, cognitive, depression L. Referrals and appointments - none  In addition, I have reviewed and discussed with patient certain preventive protocols, quality metrics, and best practice recommendations. A written personalized care plan for preventive services as well as general preventive health recommendations were provided to patient.  See attached scanned questionnaire for additional information.   Signed,   Lindell Noe, MHA, BS, LPN Health Coach

## 2018-12-06 ENCOUNTER — Encounter: Payer: Self-pay | Admitting: Family Medicine

## 2018-12-06 ENCOUNTER — Ambulatory Visit (INDEPENDENT_AMBULATORY_CARE_PROVIDER_SITE_OTHER): Payer: Medicare Other | Admitting: Family Medicine

## 2018-12-06 VITALS — BP 124/78 | HR 61 | Temp 97.6°F | Ht 65.5 in | Wt 213.8 lb

## 2018-12-06 DIAGNOSIS — M8589 Other specified disorders of bone density and structure, multiple sites: Secondary | ICD-10-CM

## 2018-12-06 DIAGNOSIS — E7800 Pure hypercholesterolemia, unspecified: Secondary | ICD-10-CM

## 2018-12-06 DIAGNOSIS — D696 Thrombocytopenia, unspecified: Secondary | ICD-10-CM

## 2018-12-06 DIAGNOSIS — I10 Essential (primary) hypertension: Secondary | ICD-10-CM | POA: Diagnosis not present

## 2018-12-06 DIAGNOSIS — E2839 Other primary ovarian failure: Secondary | ICD-10-CM

## 2018-12-06 DIAGNOSIS — R7303 Prediabetes: Secondary | ICD-10-CM

## 2018-12-06 DIAGNOSIS — Z6835 Body mass index (BMI) 35.0-35.9, adult: Secondary | ICD-10-CM

## 2018-12-06 DIAGNOSIS — Z0000 Encounter for general adult medical examination without abnormal findings: Secondary | ICD-10-CM

## 2018-12-06 DIAGNOSIS — I509 Heart failure, unspecified: Secondary | ICD-10-CM

## 2018-12-06 MED ORDER — AMLODIPINE BESYLATE 5 MG PO TABS: 5.0000 mg | ORAL_TABLET | Freq: Every day | ORAL | 3 refills | Status: DC

## 2018-12-06 MED ORDER — PANTOPRAZOLE SODIUM 40 MG PO TBEC: 40.0000 mg | DELAYED_RELEASE_TABLET | Freq: Every day | ORAL | 3 refills | Status: DC

## 2018-12-06 MED ORDER — ATORVASTATIN CALCIUM 20 MG PO TABS: 20.0000 mg | ORAL_TABLET | Freq: Every day | ORAL | 3 refills | Status: DC

## 2018-12-06 MED ORDER — LOSARTAN POTASSIUM-HCTZ 100-12.5 MG PO TABS: ORAL_TABLET | ORAL | 3 refills | Status: DC

## 2018-12-06 NOTE — Assessment & Plan Note (Signed)
Reviewed health habits including diet and exercise and skin cancer prevention Reviewed appropriate screening tests for age  Also reviewed health mt list, fam hx and immunization status , as well as social and family history   Rev amw (pt has no memory concerns-will watch) Disc self care Labs rev  D/c miacalcin and schedule dexa  No change in medicines  She will schedule her mammogram for feb

## 2018-12-06 NOTE — Assessment & Plan Note (Signed)
Lab Results  Component Value Date   HGBA1C 5.9 11/27/2018   disc imp of low glycemic diet and wt loss to prevent DM2

## 2018-12-06 NOTE — Assessment & Plan Note (Signed)
CBC rev- stable with platelet ct in 130s No symptoms Continue to follow

## 2018-12-06 NOTE — Assessment & Plan Note (Signed)
Pt had more dependent leg swelling in hot weather Otherwise no problems

## 2018-12-06 NOTE — Patient Instructions (Addendum)
If you are interested in the new shingles vaccine (Shingrix) - call your local pharmacy to check on coverage and availability  If affordable, get on a wait list at your pharmacy to get the vaccine.  To prevent diabetes  Try to get most of your carbohydrates from produce (with the exception of white potatoes)  Eat less bread/pasta/rice/snack foods/cereals/sweets and other items from the middle of the grocery store (processed carbs)   Stop the miacalcin -you are done with it   Think about exercise in the future   Schedule your bone density test at check out

## 2018-12-06 NOTE — Progress Notes (Signed)
Subjective:    Patient ID: Natasha [name], female    DOB: 07-31-1946, 72 y.o.   MRN: [id number]  HPI Here for health maintenance exam and to review chronic medical problems Feeling good overall  A lot of joint pain and stiffness      Wt Readings from Last 3 Encounters:  12/06/18 213 lb 12 oz (97 kg)  11/27/18 214 lb 8 oz (97.3 kg)  02/21/18 219 lb (99.3 kg)  down 6 lb from march - was working on it  Watching what she eats for the most part Stays active (no exercise) - taking care of the inside/outside and taking care of 2 sick brothers and 71 yo husband  35.03 kg/m   Had amw on 12/18 Mini cog-unable to recall 2 of 3 words  Score 18/20 She has not noticed any problems at home at all -no trouble with memory or concentration  One fall noted (missed a step)   cologuard 1/18 neg  Mammogram 2/19  Self breast exam - no lumps  She will make her own appt.  dexa 2/18 -stable osteopenia  Was on miacalcin- calcium level borderline high at 10.6 Had a fall but no fractures  Taking vit D   bp is stable today  No cp or palpitations or headaches or edema  No side effects to medicines  BP Readings from Last 3 Encounters:  12/06/18 124/78  11/27/18 122/80  02/21/18 (!) 142/84     Lab Results  Component Value Date   CREATININE 1.12 11/27/2018   BUN 19 11/27/2018   NA 143 11/27/2018   K 4.4 11/27/2018   CL 105 11/27/2018   CO2 32 11/27/2018   Lab Results  Component Value Date   ALT 14 11/27/2018   AST 15 11/27/2018   ALKPHOS 81 11/27/2018   BILITOT 0.7 11/27/2018    Lab Results  Component Value Date   TSH 3.87 11/27/2018     Zoster status -has not had vaccine -insurance does not pay    H/o thrombocytopenia Lab Results  Component Value Date   WBC 4.4 11/27/2018   HGB 15.0 11/27/2018   HCT 44.5 11/27/2018   MCV 87.7 11/27/2018   PLT 131.0 (L) 11/27/2018   Platelets up from 124  Elevated glucose  Lab Results  Component Value Date   HGBA1C 5.9 11/27/2018    watching this  She is a sweet eater    Cholesterol Lab Results  Component Value Date   CHOL 152 11/27/2018   CHOL 143 11/26/2017   CHOL 152 11/24/2016   Lab Results  Component Value Date   HDL 51.80 11/27/2018   HDL 55.70 11/26/2017   HDL 51.60 11/24/2016   Lab Results  Component Value Date   LDLCALC 71 11/27/2018   LDLCALC 70 11/26/2017   LDLCALC 72 11/24/2016   Lab Results  Component Value Date   TRIG 149.0 11/27/2018   TRIG 85.0 11/26/2017   TRIG 143.0 11/24/2016   Lab Results  Component Value Date   CHOLHDL 3 11/27/2018   CHOLHDL 3 11/26/2017   CHOLHDL 3 11/24/2016   No results found for: LDLDIRECT   Patient Active Problem List   Diagnosis Date Noted  . Post-menopausal bleeding 10/22/2017  . Headache syndrome 04/30/2017  . Facial pain 04/11/2017  . Temple tenderness 04/11/2017  . Dermatitis 06/27/2016  . Routine general medical examination at a health care facility 11/22/2015  . Colon cancer screening 11/22/2015  . Special screening for malignant neoplasms, colon 11/17/2014  .  Estrogen deficiency 11/17/2014  . Encounter for Medicare annual wellness exam 10/28/2013  . Prediabetes 10/28/2013  . Obesity 10/28/2013  . Post-menopausal 09/24/2012  . Allergic rhinitis 04/26/2012  . Gynecologic exam normal 04/19/2011  . Osteopenia 11/10/2008  . THROMBOCYTOPENIA 08/11/2008  . POSTMENOPAUSAL STATUS 07/09/2008  . HYPERCHOLESTEROLEMIA 07/16/2007  . Essential hypertension 07/16/2007  . CORONARY ARTERY DISEASE 07/16/2007  . Congestive heart failure ([place]) 07/16/2007  . DEGENERATIVE DISC DISEASE, LUMBAR SPINE 07/16/2007   Past Medical History:  Diagnosis Date  . CAD (coronary artery disease)    (4/05 carotid dopler- 39% stenosis) , (ECHO- mild LVH, EF 60%)  . CHF (congestive heart failure) ([place])   . DDD (degenerative disc disease), lumbar 11/2006   L5-S1 bulging disk  . Headache syndrome 04/30/2017   Left occipital and ear   . Hyperlipidemia   .  Hypertension   . Osteopenia   . Thrombocytopenia ([place])    mild   Past Surgical History:  Procedure Laterality Date  . BACK SURGERY  2004   fall at work  . BREAST BIOPSY Right 2017   core- neg  . CHOLECYSTECTOMY    . SHOULDER SURGERY  2004   fall  . TUBAL LIGATION     Social History   Tobacco Use  . Smoking status: Never Smoker  . Smokeless tobacco: Never Used  Substance Use Topics  . Alcohol use: No    Alcohol/week: 0.0 standard drinks  . Drug use: No   Family History  Problem Relation Age of Onset  . Coronary artery disease Father   . Lung cancer Sister   . Breast cancer Maternal Grandmother   . Diabetes Sister   . Other Sister        OP  . Other Mother        has pacemaker   Allergies  Allergen Reactions  . Naproxen Sodium     REACTION: stomach pain  . Clindamycin/Lincomycin Itching and Rash   Current Outpatient Medications on File Prior to Visit  Medication Sig Dispense Refill  . aspirin 81 MG tablet Take 81 mg by mouth daily.      . Cholecalciferol (VITAMIN D-3) 1000 UNITS CAPS Take 2 tablets by mouth daily.      No current facility-administered medications on file prior to visit.     Review of Systems  Constitutional: Negative for activity change, appetite change, fatigue, fever and unexpected weight change.  HENT: Negative for congestion, ear pain, rhinorrhea, sinus pressure and sore throat.   Eyes: Negative for pain, redness and visual disturbance.  Respiratory: Negative for cough, shortness of breath and wheezing.   Cardiovascular: Negative for chest pain and palpitations.  Gastrointestinal: Negative for abdominal pain, blood in stool, constipation and diarrhea.  Endocrine: Negative for polydipsia and polyuria.  Genitourinary: Negative for dysuria, frequency and urgency.  Musculoskeletal: Positive for arthralgias. Negative for back pain and myalgias.  Skin: Negative for pallor and rash.  Allergic/Immunologic: Negative for environmental allergies.    Neurological: Negative for dizziness, syncope and headaches.  Hematological: Negative for adenopathy. Does not bruise/bleed easily.  Psychiatric/Behavioral: Negative for decreased concentration and dysphoric mood. The patient is not nervous/anxious.        Objective:   Physical Exam Constitutional:      General: She is not in acute distress.    Appearance: She is well-developed. She is obese.  HENT:     Head: Normocephalic and atraumatic.     Right Ear: Tympanic membrane, ear canal and external ear normal.  Left Ear: Tympanic membrane, ear canal and external ear normal.     Nose: Nose normal.     Mouth/Throat:     Mouth: Mucous membranes are moist.  Eyes:     General: No scleral icterus.    Conjunctiva/sclera: Conjunctivae normal.     Pupils: Pupils are equal, round, and reactive to light.  Neck:     Musculoskeletal: Normal range of motion and neck supple.     Thyroid: No thyromegaly.     Vascular: No carotid bruit or JVD.  Cardiovascular:     Rate and Rhythm: Normal rate and regular rhythm.     Heart sounds: Normal heart sounds. No gallop.   Pulmonary:     Effort: Pulmonary effort is normal. No respiratory distress.     Breath sounds: Normal breath sounds. No wheezing or rales.  Chest:     Chest wall: No tenderness.  Abdominal:     General: Bowel sounds are normal. There is no distension or abdominal bruit.     Palpations: Abdomen is soft. There is no mass.     Tenderness: There is no abdominal tenderness.  Genitourinary:    Comments: Breast exam: No mass, nodules, thickening, tenderness, bulging, retraction, inflamation, nipple discharge or skin changes noted.  No axillary or clavicular LA.     Musculoskeletal: Normal range of motion.        General: No swelling or tenderness.     Right lower leg: No edema.     Left lower leg: No edema.  Lymphadenopathy:     Cervical: No cervical adenopathy.  Skin:    General: Skin is warm and dry.     Coloration: Skin is not  pale.     Findings: No erythema or rash.     Comments: Many SKs  Neurological:     General: No focal deficit present.     Mental Status: She is alert.     Cranial Nerves: No cranial nerve deficit.     Motor: No abnormal muscle tone.     Coordination: Coordination normal.     Deep Tendon Reflexes: Reflexes are normal and symmetric. Reflexes normal.  Psychiatric:        Mood and Affect: Mood normal.           Assessment & Plan:   Problem List Items Addressed This Visit      Cardiovascular and Mediastinum   Essential hypertension    bp in fair control at this time  BP Readings from Last 1 Encounters:  12/06/18 124/78   No changes needed Most recent labs reviewed  Disc lifstyle change with low sodium diet and exercise  Well controlled       Relevant Medications   amLODipine (NORVASC) 5 MG tablet   atorvastatin (LIPITOR) 20 MG tablet   losartan-hydrochlorothiazide (HYZAAR) 100-12.5 MG tablet   Congestive heart failure (HCC)    Pt had more dependent leg swelling in hot weather Otherwise no problems        Relevant Medications   amLODipine (NORVASC) 5 MG tablet   atorvastatin (LIPITOR) 20 MG tablet   losartan-hydrochlorothiazide (HYZAAR) 100-12.5 MG tablet     Musculoskeletal and Integument   Osteopenia    In pt on PPI (she has to continue)  Has finished course of miacalcin On vit D Ca level high normal-watching No fractures  Schedule next dexa 2/20        Other   THROMBOCYTOPENIA    CBC rev- stable with platelet ct in 130s  No symptoms Continue to follow      Routine general medical examination at a health care facility - Primary    Reviewed health habits including diet and exercise and skin cancer prevention Reviewed appropriate screening tests for age  Also reviewed health mt list, fam hx and immunization status , as well as social and family history   Rev amw (pt has no memory concerns-will watch) Disc self care Labs rev  D/c miacalcin and  schedule dexa  No change in medicines  She will schedule her mammogram for feb       Prediabetes    Lab Results  Component Value Date   HGBA1C 5.9 11/27/2018   disc imp of low glycemic diet and wt loss to prevent DM2       Obesity    Discussed how this problem influences overall health and the risks it imposes  Reviewed plan for weight loss with lower calorie diet (via better food choices and also portion control or program like weight watchers) and exercise building up to or more than 30 minutes 5 days per week including some aerobic activity   No time for self care- caregiving for others      HYPERCHOLESTEROLEMIA    Disc goals for lipids and reasons to control them Rev last labs with pt Rev low sat fat diet in detail Good control with atorvastatin and diet  H/o CAD      Relevant Medications   amLODipine (NORVASC) 5 MG tablet   atorvastatin (LIPITOR) 20 MG tablet   losartan-hydrochlorothiazide (HYZAAR) 100-12.5 MG tablet   Estrogen deficiency   Relevant Orders   DG Bone Density

## 2018-12-06 NOTE — Assessment & Plan Note (Signed)
Disc goals for lipids and reasons to control them Rev last labs with pt Rev low sat fat diet in detail Good control with atorvastatin and diet  H/o CAD

## 2018-12-06 NOTE — Assessment & Plan Note (Signed)
bp in fair control at this time  BP Readings from Last 1 Encounters:  12/06/18 124/78   No changes needed Most recent labs reviewed  Disc lifstyle change with low sodium diet and exercise  Well controlled

## 2018-12-06 NOTE — Assessment & Plan Note (Signed)
Discussed how this problem influences overall health and the risks it imposes  Reviewed plan for weight loss with lower calorie diet (via better food choices and also portion control or program like weight watchers) and exercise building up to or more than 30 minutes 5 days per week including some aerobic activity   No time for self care- caregiving for others

## 2018-12-06 NOTE — Assessment & Plan Note (Signed)
In pt on PPI (she has to continue)  Has finished course of miacalcin On vit D Ca level high normal-watching No fractures  Schedule next dexa 2/20

## 2018-12-09 ENCOUNTER — Other Ambulatory Visit: Payer: Self-pay | Admitting: Family Medicine

## 2018-12-09 DIAGNOSIS — Z1231 Encounter for screening mammogram for malignant neoplasm of breast: Secondary | ICD-10-CM

## 2019-01-23 ENCOUNTER — Ambulatory Visit
Admission: RE | Admit: 2019-01-23 | Discharge: 2019-01-23 | Disposition: A | Discharge status: Home / Self Care | Payer: Medicare Other | Source: Ambulatory Visit | Attending: Family Medicine | Admitting: Family Medicine

## 2019-01-23 DIAGNOSIS — E2839 Other primary ovarian failure: Secondary | ICD-10-CM | POA: Insufficient documentation

## 2019-01-23 DIAGNOSIS — Z1231 Encounter for screening mammogram for malignant neoplasm of breast: Secondary | ICD-10-CM

## 2019-02-10 ENCOUNTER — Ambulatory Visit: Payer: Medicare Other | Admitting: Adult Health

## 2019-02-28 ENCOUNTER — Other Ambulatory Visit: Payer: Self-pay | Admitting: Family Medicine

## 2019-03-03 NOTE — Telephone Encounter (Signed)
I sent in what will be equivalent

## 2019-03-03 NOTE — Telephone Encounter (Signed)
?   If okay to split 2 meds up due to back order also instructions on combo med was to take 1/2 tab qd

## 2019-08-28 ENCOUNTER — Ambulatory Visit (INDEPENDENT_AMBULATORY_CARE_PROVIDER_SITE_OTHER): Payer: Medicare Other

## 2019-08-28 ENCOUNTER — Other Ambulatory Visit: Payer: Self-pay

## 2019-08-28 DIAGNOSIS — Z23 Encounter for immunization: Secondary | ICD-10-CM

## 2019-12-01 ENCOUNTER — Telehealth: Payer: Self-pay | Admitting: Family Medicine

## 2019-12-01 DIAGNOSIS — I10 Essential (primary) hypertension: Secondary | ICD-10-CM

## 2019-12-01 DIAGNOSIS — R7303 Prediabetes: Secondary | ICD-10-CM

## 2019-12-01 DIAGNOSIS — D696 Thrombocytopenia, unspecified: Secondary | ICD-10-CM

## 2019-12-01 DIAGNOSIS — E7800 Pure hypercholesterolemia, unspecified: Secondary | ICD-10-CM

## 2019-12-01 NOTE — Telephone Encounter (Signed)
-----   Message from Ellamae Sia sent at 11/26/2019 10:06 AM EST ----- Regarding: lab orders for Tuesday, 12.22.20 Patient is scheduled for CPX labs, please order future labs, Thanks , Karna Christmas

## 2019-12-02 ENCOUNTER — Ambulatory Visit: Payer: Medicare Other

## 2019-12-02 ENCOUNTER — Other Ambulatory Visit (INDEPENDENT_AMBULATORY_CARE_PROVIDER_SITE_OTHER): Payer: Medicare Other

## 2019-12-02 ENCOUNTER — Other Ambulatory Visit: Payer: Self-pay

## 2019-12-02 DIAGNOSIS — R7303 Prediabetes: Secondary | ICD-10-CM | POA: Diagnosis not present

## 2019-12-02 DIAGNOSIS — D696 Thrombocytopenia, unspecified: Secondary | ICD-10-CM

## 2019-12-02 DIAGNOSIS — I10 Essential (primary) hypertension: Secondary | ICD-10-CM | POA: Diagnosis not present

## 2019-12-02 DIAGNOSIS — E7800 Pure hypercholesterolemia, unspecified: Secondary | ICD-10-CM

## 2019-12-02 LAB — COMPREHENSIVE METABOLIC PANEL
ALT: 12 U/L (ref 0–35)
AST: 17 U/L (ref 0–37)
Albumin: 4.6 g/dL (ref 3.5–5.2)
Alkaline Phosphatase: 65 U/L (ref 39–117)
BUN: 17 mg/dL (ref 6–23)
CO2: 29 mEq/L (ref 19–32)
Calcium: 10.6 mg/dL — ABNORMAL HIGH (ref 8.4–10.5)
Chloride: 105 mEq/L (ref 96–112)
Creatinine, Ser: 0.93 mg/dL (ref 0.40–1.20)
GFR: 58.95 mL/min — ABNORMAL LOW (ref >60.00)
Glucose, Bld: 104 mg/dL — ABNORMAL HIGH (ref 70–99)
Potassium: 3.8 mEq/L (ref 3.5–5.1)
Sodium: 143 mEq/L (ref 135–145)
Total Bilirubin: 0.8 mg/dL (ref 0.2–1.2)
Total Protein: 7.1 g/dL (ref 6.0–8.3)

## 2019-12-02 LAB — CBC WITH DIFFERENTIAL/PLATELET
Basophils Absolute: 0 10*3/uL (ref 0.0–0.1)
Basophils Relative: 1.1 % (ref 0.0–3.0)
Eosinophils Absolute: 0.2 10*3/uL (ref 0.0–0.7)
Eosinophils Relative: 6 % — ABNORMAL HIGH (ref 0.0–5.0)
HCT: 42.7 % (ref 36.0–46.0)
Hemoglobin: 14.1 g/dL (ref 12.0–15.0)
Lymphocytes Relative: 37.5 % (ref 12.0–46.0)
Lymphs Abs: 1.5 10*3/uL (ref 0.7–4.0)
MCHC: 33 g/dL (ref 30.0–36.0)
MCV: 90.4 fl (ref 78.0–100.0)
Monocytes Absolute: 0.3 10*3/uL (ref 0.1–1.0)
Monocytes Relative: 7 % (ref 3.0–12.0)
Neutro Abs: 1.9 10*3/uL (ref 1.4–7.7)
Neutrophils Relative %: 48.4 % (ref 43.0–77.0)
Platelets: 140 10*3/uL — ABNORMAL LOW (ref 150.0–400.0)
RBC: 4.73 Mil/uL (ref 3.87–5.11)
RDW: 13.8 % (ref 11.5–15.5)
WBC: 4 10*3/uL (ref 4.0–10.5)

## 2019-12-02 LAB — TSH: TSH: 3.44 u[IU]/mL (ref 0.35–4.50)

## 2019-12-02 LAB — LIPID PANEL
Cholesterol: 154 mg/dL (ref 0–200)
HDL: 46.5 mg/dL (ref >39.00)
LDL Cholesterol: 73 mg/dL (ref 0–99)
NonHDL: 107.52
Total CHOL/HDL Ratio: 3
Triglycerides: 175 mg/dL — ABNORMAL HIGH (ref 0.0–149.0)
VLDL: 35 mg/dL (ref 0.0–40.0)

## 2019-12-02 LAB — HEMOGLOBIN A1C: Hgb A1c MFr Bld: 5.5 % (ref 4.6–6.5)

## 2019-12-03 ENCOUNTER — Other Ambulatory Visit: Payer: Self-pay

## 2019-12-03 ENCOUNTER — Ambulatory Visit (INDEPENDENT_AMBULATORY_CARE_PROVIDER_SITE_OTHER): Payer: Medicare Other

## 2019-12-03 DIAGNOSIS — Z0000 Encounter for general adult medical examination without abnormal findings: Secondary | ICD-10-CM

## 2019-12-03 NOTE — Progress Notes (Signed)
Subjective:   Natasha [name] is a 73 y.o. female who presents for Medicare Annual (Subsequent) preventive examination.  Review of Systems: N/A   This visit is being conducted through telemedicine via telephone at the nurse health advisor's home address due to the COVID-19 pandemic. This patient has given me verbal consent via doximity to conduct this visit, patient states they are participating from their home address. Patient and myself are on the telephone call. There is no referral for this visit. Some vital signs may be absent or patient reported.    Patient identification: identified by name, DOB, and current address   Cardiac Risk Factors include: advanced age (>15men, >44 women);hypertension;Other (see comment), Risk factor comments: hypercholesterolemia     Objective:     Vitals: LMP 12/11/1993   There is no height or weight on file to calculate BMI.  Advanced Directives 12/03/2019 11/27/2018 11/26/2017 11/24/2016  Does Patient Have a Medical Advance Directive? Yes Yes Yes Yes  Type of Paramedic of [place];Living will [place];Living will [place];Living will [place];Living will  Copy of [place] in Chart? Yes - validated most recent copy scanned in chart (See row information) Yes - validated most recent copy scanned in chart (See row information) Yes Yes    Tobacco Social History   Tobacco Use  Smoking Status Never Smoker  Smokeless Tobacco Never Used     Counseling given: Not Answered   Clinical Intake:  Pre-visit preparation completed: Yes  Pain : No/denies pain     Nutritional Risks: None Diabetes: No  How often do you need to have someone help you when you read instructions, pamphlets, or other written materials from your doctor or pharmacy?: 1 - Never What is the last grade level you completed in school?: 12th  Interpreter Needed?:  No  Information entered by :: CJohnson, LPN  Past Medical History:  Diagnosis Date  . CAD (coronary artery disease)    (4/05 carotid dopler- 39% stenosis) , (ECHO- mild LVH, EF 60%)  . CHF (congestive heart failure) ([place])   . DDD (degenerative disc disease), lumbar 11/2006   L5-S1 bulging disk  . Headache syndrome 04/30/2017   Left occipital and ear   . Hyperlipidemia   . Hypertension   . Osteopenia   . Thrombocytopenia ([place])    mild   Past Surgical History:  Procedure Laterality Date  . BACK SURGERY  2004   fall at work  . BREAST BIOPSY Right 2017   core- neg  . CHOLECYSTECTOMY    . SHOULDER SURGERY  2004   fall  . TUBAL LIGATION     Family History  Problem Relation Age of Onset  . Coronary artery disease Father   . Lung cancer Sister   . Breast cancer Maternal Grandmother   . Diabetes Sister   . Other Sister        OP  . Other Mother        has pacemaker   Social History   Socioeconomic History  . Marital status: Married    Spouse name: Alante Reategui  . Number of children: 4  . Years of education: 6  . Highest education level: Not on file  Occupational History  . Occupation: Pharmacologist- Retired    Fish farm manager: [employer]  Tobacco Use  . Smoking status: Never Smoker  . Smokeless tobacco: Never Used  Substance and Sexual Activity  . Alcohol use: No  Alcohol/week: 0.0 standard drinks  . Drug use: No  . Sexual activity: Not Currently  Other Topics Concern  . Not on file  Social History Narrative   Lives with husband, Fritz Pickerel   Caffeine use: 1 cup coffee every morning   Soda rare   4 daughters   Social Determinants of Health   Financial Resource Strain: Low Risk   . Difficulty of Paying Living Expenses: Not hard at all  Food Insecurity: No Food Insecurity  . Worried About Charity fundraiser in the Last Year: Never true  . Ran Out of Food in the Last Year: Never true  Transportation Needs: No Transportation Needs  . Lack of Transportation  (Medical): No  . Lack of Transportation (Non-Medical): No  Physical Activity: Inactive  . Days of Exercise per Week: 0 days  . Minutes of Exercise per Session: 0 min  Stress: No Stress Concern Present  . Feeling of Stress : Not at all  Social Connections:   . Frequency of Communication with Friends and Family: Not on file  . Frequency of Social Gatherings with Friends and Family: Not on file  . Attends Religious Services: Not on file  . Active Member of Clubs or Organizations: Not on file  . Attends Archivist Meetings: Not on file  . Marital Status: Not on file    Outpatient Encounter Medications as of 12/03/2019  Medication Sig  . amLODipine (NORVASC) 5 MG tablet Take 1 tablet (5 mg total) by mouth daily.  Marland Kitchen aspirin 81 MG tablet Take 81 mg by mouth daily.    Marland Kitchen atorvastatin (LIPITOR) 20 MG tablet Take 1 tablet (20 mg total) by mouth daily.  . Cholecalciferol (VITAMIN D-3) 1000 UNITS CAPS Take 2 tablets by mouth daily.   . hydrochlorothiazide (HYDRODIURIL) 12.5 MG tablet TAKE 1/2 TABLET EVERY DAY WITH LOSARTAN  . losartan (COZAAR) 50 MG tablet TAKE ONE TABLET EVERY DAY WITH 1/2 TABLET HCTZ  . pantoprazole (PROTONIX) 40 MG tablet Take 1 tablet (40 mg total) by mouth daily.   No facility-administered encounter medications on file as of 12/03/2019.    Activities of Daily Living In your present state of health, do you have any difficulty performing the following activities: 12/03/2019  Hearing? N  Vision? N  Difficulty concentrating or making decisions? N  Walking or climbing stairs? N  Dressing or bathing? N  Doing errands, shopping? N  Preparing Food and eating ? N  Using the Toilet? N  In the past six months, have you accidently leaked urine? N  Do you have problems with loss of bowel control? N  Managing your Medications? N  Managing your Finances? N  Housekeeping or managing your Housekeeping? N  Some recent data might be hidden    Patient Care Team: [place],  Wynelle Fanny, MD as PCP - General Kerin Ransom, Stephannie Li, OD as Consulting Physician (Optometry) Kathrynn Ducking, MD as Consulting Physician (Neurology) Altha Harm, [place] as Referring Physician (Dentistry)    Assessment:   This is a routine wellness examination for Natasha [name].  Exercise Activities and Dietary recommendations Current Exercise Habits: The patient does not participate in regular exercise at present, Exercise limited by: None identified  Goals    . Follow up with Primary Care Provider     Starting 11/27/2018, I will continue to take medications as prescribed and to follow up with PCP as scheduled.     . Patient Stated     12/03/2019, I will continue to eat more  healthier and try to lose some weight.        Fall Risk Fall Risk  12/03/2019 11/27/2018 11/26/2017 11/24/2016 11/22/2015  Falls in the past year? 0 1 No No No  Comment - fell after missing a step; bruise to right hip - - -  Number falls in past yr: 0 0 - - -  Injury with Fall? 0 1 - - -  Risk for fall due to : Medication side effect - - - -  Follow up Falls evaluation completed;Falls prevention discussed - - - -   Is the patient's home free of loose throw rugs in walkways, pet beds, electrical cords, etc?   yes      Grab bars in the bathroom? no      Handrails on the stairs?   yes      Adequate lighting?   yes  Timed Get Up and Go performed: N/A  Depression Screen PHQ 2/9 Scores 12/03/2019 11/27/2018 11/26/2017 11/24/2016  PHQ - 2 Score 0 0 0 0  PHQ- 9 Score 0 0 0 -     Cognitive Function MMSE - Mini Mental State Exam 12/03/2019 11/27/2018 11/26/2017 11/24/2016  Orientation to time 5 5 5 5   Orientation to Place 5 5 5 5   Registration 3 3 3 3   Attention/ Calculation 5 0 0 0  Recall 3 1 3 3   Recall-comments - unable to recall 2 of 3 words - -  Language- name 2 objects - 0 0 0  Language- repeat 1 1 1 1   Language- follow 3 step command - 3 3 3   Language- read & follow direction - 0 0 0  Write a sentence - 0 0  0  Copy design - 0 0 0  Total score - 18 20 20   Mini Cog  Mini-Cog screen was completed. Maximum score is 22. A value of 0 denotes this part of the MMSE was not completed or the patient failed this part of the Mini-Cog screening.       Immunization History  Administered Date(s) Administered  . Fluad Quad(high Dose 65+) 08/28/2019  . Influenza Split 09/24/2012  . Influenza, High Dose Seasonal PF 10/03/2016, 09/25/2018  . Influenza,inj,Quad PF,6+ Mos 10/28/2013, 10/14/2014  . Influenza-Unspecified 09/06/2015, 09/11/2017  . Pneumococcal Conjugate-13 11/17/2014  . Pneumococcal Polysaccharide-23 10/28/2013  . Td 12/11/2002  . Tdap 03/06/2014    Qualifies for Shingles Vaccine? Yes  Screening Tests Health Maintenance  Topic Date Due  . COLON CANCER SCREENING ANNUAL FOBT  12/27/2019 (Originally 12/02/2016)  . Fecal DNA (Cologuard)  12/27/2019  . MAMMOGRAM  01/24/2020  . TETANUS/TDAP  03/06/2024  . INFLUENZA VACCINE  Completed  . DEXA SCAN  Completed  . Hepatitis C Screening  Completed  . PNA vac Low Risk Adult  Completed  . PAP SMEAR-Modifier  Discontinued    Cancer Screenings: Lung: Low Dose CT Chest recommended if Age 51-80 years, 30 pack-year currently smoking OR have quit w/in 15 years. Patient does not qualify. Breast:  Up to date on Mammogram? Yes, completed 01/23/2019   Up to date of Bone Density/Dexa? Yes, completed 01/23/2019 Colorectal: Cologuard completed 12/26/2016  Additional Screenings:  Hepatitis C Screening: 11/24/2016     Plan:   Patient will continue to eat healthy and work on losing some weight.   I have personally reviewed and noted the following in the patient's chart:   . Medical and social history . Use of alcohol, tobacco or illicit drugs  . Current medications and supplements .  Functional ability and status . Nutritional status . Physical activity . Advanced directives . List of other physicians . Hospitalizations, surgeries, and ER visits  in previous 12 months . Vitals . Screenings to include cognitive, depression, and falls . Referrals and appointments  In addition, I have reviewed and discussed with patient certain preventive protocols, quality metrics, and best practice recommendations. A written personalized care plan for preventive services as well as general preventive health recommendations were provided to patient.     Andrez Grime, LPN  [phone]

## 2019-12-03 NOTE — Progress Notes (Signed)
PCP notes:  Health Maintenance: No gaps noted   Abnormal Screenings: none   Patient concerns: none   Nurse concerns: none    Next PCP appt.: 12/09/2019 @ 8:30 am

## 2019-12-03 NOTE — Patient Instructions (Signed)
Natasha [name] , Thank you for taking time to come for your Medicare Wellness Visit. I appreciate your ongoing commitment to your health goals. Please review the following plan we discussed and let me know if I can assist you in the future.   Screening recommendations/referrals: Colonoscopy: Cologuard completed 12/26/2016 Mammogram: Up to date, completed 01/23/2019 Bone Density: Up to date, completed 01/23/2019 Recommended yearly ophthalmology/optometry visit for glaucoma screening and checkup Recommended yearly dental visit for hygiene and checkup  Vaccinations: Influenza vaccine: Up to date, completed 08/28/2019 Pneumococcal vaccine: Completed series Tdap vaccine: Up to date, completed 03/06/2014 Shingles vaccine: discussed    Advanced directives: Please bring a copy of your POA (Power of [place]) and/or Living Will to your next appointment.   Conditions/risks identified: hypertension, hypercholesterolemia  Next appointment: 12/09/2019 @ 8:30 am    Preventive Care 73 Years and Older, Female Preventive care refers to lifestyle choices and visits with your health care provider that can promote health and wellness. What does preventive care include?  A yearly physical exam. This is also called an annual well check.  Dental exams once or twice a year.  Routine eye exams. Ask your health care provider how often you should have your eyes checked.  Personal lifestyle choices, including:  Daily care of your teeth and gums.  Regular physical activity.  Eating a healthy diet.  Avoiding tobacco and drug use.  Limiting alcohol use.  Practicing safe sex.  Taking low-dose aspirin every day.  Taking vitamin and mineral supplements as recommended by your health care provider. What happens during an annual well check? The services and screenings done by your health care provider during your annual well check will depend on your age, overall health, lifestyle risk factors, and family  history of disease. Counseling  Your health care provider may ask you questions about your:  Alcohol use.  Tobacco use.  Drug use.  Emotional well-being.  Home and relationship well-being.  Sexual activity.  Eating habits.  History of falls.  Memory and ability to understand (cognition).  Work and work Statistician.  Reproductive health. Screening  You may have the following tests or measurements:  Height, weight, and BMI.  Blood pressure.  Lipid and cholesterol levels. These may be checked every 5 years, or more frequently if you are over 26 years old.  Skin check.  Lung cancer screening. You may have this screening every year starting at age 73 if you have a 30-pack-year history of smoking and currently smoke or have quit within the past 15 years.  Fecal occult blood test (FOBT) of the stool. You may have this test every year starting at age 45.  Flexible sigmoidoscopy or colonoscopy. You may have a sigmoidoscopy every 5 years or a colonoscopy every 10 years starting at age 74.  Hepatitis C blood test.  Hepatitis B blood test.  Sexually transmitted disease (STD) testing.  Diabetes screening. This is done by checking your blood sugar (glucose) after you have not eaten for a while (fasting). You may have this done every 1-3 years.  Bone density scan. This is done to screen for osteoporosis. You may have this done starting at age 73.  Mammogram. This may be done every 1-2 years. Talk to your health care provider about how often you should have regular mammograms. Talk with your health care provider about your test results, treatment options, and if necessary, the need for more tests. Vaccines  Your health care provider may recommend certain vaccines, such as:  Influenza  vaccine. This is recommended every year.  Tetanus, diphtheria, and acellular pertussis (Tdap, Td) vaccine. You may need a Td booster every 10 years.  Zoster vaccine. You may need this after  age 50.  Pneumococcal 13-valent conjugate (PCV13) vaccine. One dose is recommended after age 73.  Pneumococcal polysaccharide (PPSV23) vaccine. One dose is recommended after age 73. Talk to your health care provider about which screenings and vaccines you need and how often you need them. This information is not intended to replace advice given to you by your health care provider. Make sure you discuss any questions you have with your health care provider. Document Released: 12/24/2015 Document Revised: 08/16/2016 Document Reviewed: 09/28/2015 Elsevier Interactive Patient Education  2017 [place] Prevention in the Home Falls can cause injuries. They can happen to people of all ages. There are many things you can do to make your home safe and to help prevent falls. What can I do on the outside of my home?  Regularly fix the edges of walkways and driveways and fix any cracks.  Remove anything that might make you trip as you walk through a door, such as a raised step or threshold.  Trim any bushes or trees on the path to your home.  Use bright outdoor lighting.  Clear any walking paths of anything that might make someone trip, such as rocks or tools.  Regularly check to see if handrails are loose or broken. Make sure that both sides of any steps have handrails.  Any raised decks and porches should have guardrails on the edges.  Have any leaves, snow, or ice cleared regularly.  Use sand or salt on walking paths during winter.  Clean up any spills in your garage right away. This includes oil or grease spills. What can I do in the bathroom?  Use night lights.  Install grab bars by the toilet and in the tub and shower. Do not use towel bars as grab bars.  Use non-skid mats or decals in the tub or shower.  If you need to sit down in the shower, use a plastic, non-slip stool.  Keep the floor dry. Clean up any water that spills on the floor as soon as it  happens.  Remove soap buildup in the tub or shower regularly.  Attach bath mats securely with double-sided non-slip rug tape.  Do not have throw rugs and other things on the floor that can make you trip. What can I do in the bedroom?  Use night lights.  Make sure that you have a light by your bed that is easy to reach.  Do not use any sheets or blankets that are too big for your bed. They should not hang down onto the floor.  Have a firm chair that has side arms. You can use this for support while you get dressed.  Do not have throw rugs and other things on the floor that can make you trip. What can I do in the kitchen?  Clean up any spills right away.  Avoid walking on wet floors.  Keep items that you use a lot in easy-to-reach places.  If you need to reach something above you, use a strong step stool that has a grab bar.  Keep electrical cords out of the way.  Do not use floor polish or wax that makes floors slippery. If you must use wax, use non-skid floor wax.  Do not have throw rugs and other things on the floor that can  make you trip. What can I do with my stairs?  Do not leave any items on the stairs.  Make sure that there are handrails on both sides of the stairs and use them. Fix handrails that are broken or loose. Make sure that handrails are as long as the stairways.  Check any carpeting to make sure that it is firmly attached to the stairs. Fix any carpet that is loose or worn.  Avoid having throw rugs at the top or bottom of the stairs. If you do have throw rugs, attach them to the floor with carpet tape.  Make sure that you have a light switch at the top of the stairs and the bottom of the stairs. If you do not have them, ask someone to add them for you. What else can I do to help prevent falls?  Wear shoes that:  Do not have high heels.  Have rubber bottoms.  Are comfortable and fit you well.  Are closed at the toe. Do not wear sandals.  If you  use a stepladder:  Make sure that it is fully opened. Do not climb a closed stepladder.  Make sure that both sides of the stepladder are locked into place.  Ask someone to hold it for you, if possible.  Clearly mark and make sure that you can see:  Any grab bars or handrails.  First and last steps.  Where the edge of each step is.  Use tools that help you move around (mobility aids) if they are needed. These include:  Canes.  Walkers.  Scooters.  Crutches.  Turn on the lights when you go into a dark area. Replace any light bulbs as soon as they burn out.  Set up your furniture so you have a clear path. Avoid moving your furniture around.  If any of your floors are uneven, fix them.  If there are any pets around you, be aware of where they are.  Review your medicines with your doctor. Some medicines can make you feel dizzy. This can increase your chance of falling. Ask your doctor what other things that you can do to help prevent falls. This information is not intended to replace advice given to you by your health care provider. Make sure you discuss any questions you have with your health care provider. Document Released: 09/23/2009 Document Revised: 05/04/2016 Document Reviewed: 01/01/2015 Elsevier Interactive Patient Education  2017 [employer].

## 2019-12-09 ENCOUNTER — Encounter: Payer: Self-pay | Admitting: Family Medicine

## 2019-12-09 ENCOUNTER — Ambulatory Visit (INDEPENDENT_AMBULATORY_CARE_PROVIDER_SITE_OTHER): Payer: Medicare Other | Admitting: Family Medicine

## 2019-12-09 ENCOUNTER — Other Ambulatory Visit: Payer: Self-pay

## 2019-12-09 VITALS — BP 136/78 | HR 67 | Temp 97.3°F | Ht 64.0 in | Wt 193.2 lb

## 2019-12-09 DIAGNOSIS — M8589 Other specified disorders of bone density and structure, multiple sites: Secondary | ICD-10-CM

## 2019-12-09 DIAGNOSIS — Z6833 Body mass index (BMI) 33.0-33.9, adult: Secondary | ICD-10-CM

## 2019-12-09 DIAGNOSIS — I10 Essential (primary) hypertension: Secondary | ICD-10-CM

## 2019-12-09 DIAGNOSIS — M5137 Other intervertebral disc degeneration, lumbosacral region: Secondary | ICD-10-CM

## 2019-12-09 DIAGNOSIS — E6609 Other obesity due to excess calories: Secondary | ICD-10-CM

## 2019-12-09 DIAGNOSIS — I509 Heart failure, unspecified: Secondary | ICD-10-CM

## 2019-12-09 DIAGNOSIS — E7800 Pure hypercholesterolemia, unspecified: Secondary | ICD-10-CM

## 2019-12-09 DIAGNOSIS — Z0000 Encounter for general adult medical examination without abnormal findings: Secondary | ICD-10-CM | POA: Diagnosis not present

## 2019-12-09 DIAGNOSIS — R7303 Prediabetes: Secondary | ICD-10-CM

## 2019-12-09 DIAGNOSIS — D696 Thrombocytopenia, unspecified: Secondary | ICD-10-CM

## 2019-12-09 MED ORDER — PANTOPRAZOLE SODIUM 40 MG PO TBEC: 40.0000 mg | DELAYED_RELEASE_TABLET | Freq: Every day | ORAL | 3 refills | Status: DC

## 2019-12-09 MED ORDER — LOSARTAN POTASSIUM 50 MG PO TABS: ORAL_TABLET | ORAL | 3 refills | Status: DC

## 2019-12-09 MED ORDER — HYDROCHLOROTHIAZIDE 12.5 MG PO TABS: ORAL_TABLET | ORAL | 3 refills | Status: DC

## 2019-12-09 MED ORDER — AMLODIPINE BESYLATE 5 MG PO TABS: 5.0000 mg | ORAL_TABLET | Freq: Every day | ORAL | 3 refills | Status: DC

## 2019-12-09 MED ORDER — ATORVASTATIN CALCIUM 20 MG PO TABS: 20.0000 mg | ORAL_TABLET | Freq: Every day | ORAL | 3 refills | Status: DC

## 2019-12-09 NOTE — Assessment & Plan Note (Signed)
Struggling with this Has had epidural injections  Plans to return to her spine specialists

## 2019-12-09 NOTE — Assessment & Plan Note (Addendum)
Improved with wt loss and better diet  Lab Results  Component Value Date   HGBA1C 5.5 12/02/2019    disc imp of low glycemic diet and wt loss to prevent DM2

## 2019-12-09 NOTE — Assessment & Plan Note (Signed)
Discussed how this problem influences overall health and the risks it imposes  Reviewed plan for weight loss with lower calorie diet (via better food choices and also portion control or program like weight watchers) and exercise building up to or more than 30 minutes 5 days per week including some aerobic activity   Commended on wt loss so far  

## 2019-12-09 NOTE — Progress Notes (Signed)
Subjective:    Patient ID: Natasha [name], female    DOB: Jan 21, 1946, 73 y.o.   MRN: [id number]  This visit occurred during the SARS-CoV-2 public health emergency.  Safety protocols were in place, including screening questions prior to the visit, additional usage of staff PPE, and extensive cleaning of exam room while observing appropriate contact time as indicated for disinfecting solutions.    HPI  Here for health maintenance exam and to review chronic medical problems    She tested positive for covid on nov 7th  (mild to moderate)  Possibly got it in church  Is feeling better   Feeling fair  Has had back problems /sciatica with injections  Has to follow up  Interferes with her ability to function   Wt Readings from Last 3 Encounters:  12/09/19 193 lb 4 oz (87.7 kg)  12/06/18 213 lb 12 oz (97 kg)  11/27/18 214 lb 8 oz (97.3 kg)  lost weight  She does not eat out anymore and watches what she eats - big difference  33.17 kg/m   Cannot do a lot of exercise with her back   Had amw on 12/23 No gaps noted  cologuard done 1/18 -neg  Mammogram 2/20  Self breast exam -no lumps   Tdap 3/15  dexa 2/20 -stable osteopenia Falls-none Fractures -none  Supplements - taking vit D  Exercise -none  Took miacalcin in the past   Zoster status - would be interested if shingrix is covered   bp is stable today  No cp or palpitations or headaches or edema  No side effects to medicines  BP Readings from Last 3 Encounters:  12/09/19 136/78  12/06/18 124/78  11/27/18 122/80     H/o CAD with CHF-diagnosed many years ago with Dr Carmon Sails  Thinks she had an echo  No issues with cp or swelling or sob  Feels good   Pulse Readings from Last 3 Encounters:  12/09/19 67  12/06/18 61  11/27/18 (!) 55     Hyperlipidemia Lab Results  Component Value Date   CHOL 154 12/02/2019   CHOL 152 11/27/2018   CHOL 143 11/26/2017   Lab Results  Component Value Date   HDL 46.50  12/02/2019   HDL 51.80 11/27/2018   HDL 55.70 11/26/2017   Lab Results  Component Value Date   LDLCALC 73 12/02/2019   LDLCALC 71 11/27/2018   LDLCALC 70 11/26/2017   Lab Results  Component Value Date   TRIG 175.0 (H) 12/02/2019   TRIG 149.0 11/27/2018   TRIG 85.0 11/26/2017   Lab Results  Component Value Date   CHOLHDL 3 12/02/2019   CHOLHDL 3 11/27/2018   CHOLHDL 3 11/26/2017   No results found for: LDLDIRECT Taking atorvastatin 20 mg  Eating well    Thrombocytopenia Lab Results  Component Value Date   WBC 4.0 12/02/2019   HGB 14.1 12/02/2019   HCT 42.7 12/02/2019   MCV 90.4 12/02/2019   PLT 140.0 (L) 12/02/2019  up from 130s  No bleeding or bruising issues    Prediabetes Lab Results  Component Value Date   HGBA1C 5.5 12/02/2019  down from 5.9 -good   Lab Results  Component Value Date   CREATININE 0.93 12/02/2019   BUN 17 12/02/2019   NA 143 12/02/2019   K 3.8 12/02/2019   CL 105 12/02/2019   CO2 29 12/02/2019   Lab Results  Component Value Date   ALT 12 12/02/2019   AST 17 12/02/2019  ALKPHOS 65 12/02/2019   BILITOT 0.8 12/02/2019    Lab Results  Component Value Date   TSH 3.44 12/02/2019     Patient Active Problem List   Diagnosis Date Noted  . Headache syndrome 04/30/2017  . Facial pain 04/11/2017  . Temple tenderness 04/11/2017  . Routine general medical examination at a health care facility 11/22/2015  . Colon cancer screening 11/22/2015  . Special screening for malignant neoplasms, colon 11/17/2014  . Estrogen deficiency 11/17/2014  . Encounter for Medicare annual wellness exam 10/28/2013  . Prediabetes 10/28/2013  . Obesity 10/28/2013  . Post-menopausal 09/24/2012  . Allergic rhinitis 04/26/2012  . Gynecologic exam normal 04/19/2011  . Osteopenia 11/10/2008  . THROMBOCYTOPENIA 08/11/2008  . POSTMENOPAUSAL STATUS 07/09/2008  . HYPERCHOLESTEROLEMIA 07/16/2007  . Essential hypertension 07/16/2007  . CORONARY ARTERY DISEASE  07/16/2007  . Congestive heart failure ([place]) 07/16/2007  . DEGENERATIVE DISC DISEASE, LUMBAR SPINE 07/16/2007   Past Medical History:  Diagnosis Date  . CAD (coronary artery disease)    (4/05 carotid dopler- 39% stenosis) , (ECHO- mild LVH, EF 60%)  . CHF (congestive heart failure) ([place])   . DDD (degenerative disc disease), lumbar 11/2006   L5-S1 bulging disk  . Headache syndrome 04/30/2017   Left occipital and ear   . Hyperlipidemia   . Hypertension   . Osteopenia   . Thrombocytopenia ([place])    mild   Past Surgical History:  Procedure Laterality Date  . BACK SURGERY  2004   fall at work  . BREAST BIOPSY Right 2017   core- neg  . CHOLECYSTECTOMY    . SHOULDER SURGERY  2004   fall  . TUBAL LIGATION     Social History   Tobacco Use  . Smoking status: Never Smoker  . Smokeless tobacco: Never Used  Substance Use Topics  . Alcohol use: No    Alcohol/week: 0.0 standard drinks  . Drug use: No   Family History  Problem Relation Age of Onset  . Coronary artery disease Father   . Lung cancer Sister   . Breast cancer Maternal Grandmother   . Diabetes Sister   . Other Sister        OP  . Other Mother        has pacemaker   Allergies  Allergen Reactions  . Naproxen Sodium     REACTION: stomach pain  . Clindamycin/Lincomycin Itching and Rash   Current Outpatient Medications on File Prior to Visit  Medication Sig Dispense Refill  . aspirin 81 MG tablet Take 81 mg by mouth daily.      . Cholecalciferol (VITAMIN D-3) 1000 UNITS CAPS Take 2 tablets by mouth daily.      No current facility-administered medications on file prior to visit.    Review of Systems  Constitutional: Negative for activity change, appetite change, fatigue, fever and unexpected weight change.  HENT: Negative for congestion, ear pain, rhinorrhea, sinus pressure and sore throat.   Eyes: Negative for pain, redness and visual disturbance.  Respiratory: Negative for cough, shortness of breath and  wheezing.   Cardiovascular: Negative for chest pain and palpitations.  Gastrointestinal: Negative for abdominal pain, blood in stool, constipation and diarrhea.  Endocrine: Negative for polydipsia and polyuria.  Genitourinary: Negative for dysuria, frequency and urgency.  Musculoskeletal: Positive for back pain. Negative for arthralgias and myalgias.  Skin: Negative for pallor and rash.  Allergic/Immunologic: Negative for environmental allergies.  Neurological: Negative for dizziness, syncope and headaches.  Hematological: Negative for adenopathy. Does  not bruise/bleed easily.  Psychiatric/Behavioral: Negative for decreased concentration and dysphoric mood. The patient is not nervous/anxious.        Objective:   Physical Exam Constitutional:      General: She is not in acute distress.    Appearance: Normal appearance. She is well-developed. She is obese. She is not ill-appearing or diaphoretic.  HENT:     Head: Normocephalic and atraumatic.     Right Ear: Tympanic membrane, ear canal and external ear normal.     Left Ear: Tympanic membrane, ear canal and external ear normal.     Nose: Nose normal. No congestion.     Mouth/Throat:     Mouth: Mucous membranes are moist.     Pharynx: Oropharynx is clear. No posterior oropharyngeal erythema.  Eyes:     General: No scleral icterus.    Extraocular Movements: Extraocular movements intact.     Conjunctiva/sclera: Conjunctivae normal.     Pupils: Pupils are equal, round, and reactive to light.  Neck:     Thyroid: No thyromegaly.     Vascular: No carotid bruit or JVD.  Cardiovascular:     Rate and Rhythm: Normal rate and regular rhythm.     Pulses: Normal pulses.     Heart sounds: Normal heart sounds. No gallop.   Pulmonary:     Effort: Pulmonary effort is normal. No respiratory distress.     Breath sounds: Normal breath sounds. No wheezing.     Comments: Good air exch Chest:     Chest wall: No tenderness.  Abdominal:     General:  Bowel sounds are normal. There is no distension or abdominal bruit.     Palpations: Abdomen is soft. There is no mass.     Tenderness: There is no abdominal tenderness.     Hernia: No hernia is present.  Genitourinary:    Comments: Breast exam: No mass, nodules, thickening, tenderness, bulging, retraction, inflamation, nipple discharge or skin changes noted.  No axillary or clavicular LA.     Musculoskeletal:        General: No tenderness. Normal range of motion.     Cervical back: Normal range of motion and neck supple. No rigidity. No muscular tenderness.     Right lower leg: No edema.     Left lower leg: No edema.  Lymphadenopathy:     Cervical: No cervical adenopathy.  Skin:    General: Skin is warm and dry.     Coloration: Skin is not pale.     Findings: No erythema or rash.     Comments: Some lentigines and light colored SKs  Neurological:     Mental Status: She is alert. Mental status is at baseline.     Cranial Nerves: No cranial nerve deficit.     Motor: No abnormal muscle tone.     Coordination: Coordination normal.     Gait: Gait normal.     Deep Tendon Reflexes: Reflexes are normal and symmetric. Reflexes normal.  Psychiatric:        Mood and Affect: Mood normal.        Cognition and Memory: Cognition and memory normal.           Assessment & Plan:   Problem List Items Addressed This Visit      Cardiovascular and Mediastinum   Essential hypertension    bp in fair control at this time  BP Readings from Last 1 Encounters:  12/09/19 136/78   No changes needed Most recent labs  reviewed  Disc lifstyle change with low sodium diet and exercise        Relevant Medications   atorvastatin (LIPITOR) 20 MG tablet   hydrochlorothiazide (HYDRODIURIL) 12.5 MG tablet   losartan (COZAAR) 50 MG tablet   amLODipine (NORVASC) 5 MG tablet   Congestive heart failure (HCC)    Per pt- this was diagnosed by her previous pcp with an echo Has not had any symptoms in years    Nl exam today      Relevant Medications   atorvastatin (LIPITOR) 20 MG tablet   hydrochlorothiazide (HYDRODIURIL) 12.5 MG tablet   losartan (COZAAR) 50 MG tablet   amLODipine (NORVASC) 5 MG tablet     Musculoskeletal and Integument   DEGENERATIVE DISC DISEASE, LUMBAR SPINE    Struggling with this Has had epidural injections  Plans to return to her spine specialists      Osteopenia    dexa 2/20 No falls or fx  On vit D Past miacalcin  Continue to monitor  She does take chronic ppi        Other   HYPERCHOLESTEROLEMIA    Disc goals for lipids and reasons to control them Rev last labs with pt Rev low sat fat diet in detail Fairly good control with atorvastatin       Relevant Medications   atorvastatin (LIPITOR) 20 MG tablet   hydrochlorothiazide (HYDRODIURIL) 12.5 MG tablet   losartan (COZAAR) 50 MG tablet   amLODipine (NORVASC) 5 MG tablet   THROMBOCYTOPENIA    Stable to improved with pl ct of 140  No symptoms Will continue to watch      Prediabetes    Improved with wt loss and better diet  Lab Results  Component Value Date   HGBA1C 5.5 12/02/2019    disc imp of low glycemic diet and wt loss to prevent DM2       Obesity    Discussed how this problem influences overall health and the risks it imposes  Reviewed plan for weight loss with lower calorie diet (via better food choices and also portion control or program like weight watchers) and exercise building up to or more than 30 minutes 5 days per week including some aerobic activity   Commended on wt loss so far      Routine general medical examination at a health care facility - Primary    Reviewed health habits including diet and exercise and skin cancer prevention Reviewed appropriate screening tests for age  Also reviewed health mt list, fam hx and immunization status , as well as social and family history   See HPI Labs reviewed amw reviewed Discussed shingrix vaccine-pt is considering if  covered  No falls or fractures

## 2019-12-09 NOTE — Patient Instructions (Addendum)
Don't forget to schedule your mammogram for February   If you are interested in the new shingles vaccine (Shingrix) - call your local pharmacy to check on coverage and availability  If affordable, get on a wait list at your pharmacy to get the vaccine.    keep up the good work with weight loss   Get back to your spine doctors and make a plan for the back pain/sciatica

## 2019-12-09 NOTE — Assessment & Plan Note (Signed)
bp in fair control at this time  BP Readings from Last 1 Encounters:  12/09/19 136/78   No changes needed Most recent labs reviewed  Disc lifstyle change with low sodium diet and exercise

## 2019-12-09 NOTE — Assessment & Plan Note (Signed)
Reviewed health habits including diet and exercise and skin cancer prevention Reviewed appropriate screening tests for age  Also reviewed health mt list, fam hx and immunization status , as well as social and family history   See HPI Labs reviewed amw reviewed Discussed shingrix vaccine-pt is considering if covered  No falls or fractures

## 2019-12-09 NOTE — Assessment & Plan Note (Signed)
Per pt- this was diagnosed by her previous pcp with an echo Has not had any symptoms in years  Nl exam today

## 2019-12-09 NOTE — Assessment & Plan Note (Signed)
Stable to improved with pl ct of 140  No symptoms Will continue to watch

## 2019-12-09 NOTE — Assessment & Plan Note (Signed)
dexa 2/20 No falls or fx  On vit D Past miacalcin  Continue to monitor  She does take chronic ppi

## 2019-12-09 NOTE — Assessment & Plan Note (Signed)
Disc goals for lipids and reasons to control them Rev last labs with pt Rev low sat fat diet in detail Fairly good control with atorvastatin

## 2019-12-19 ENCOUNTER — Other Ambulatory Visit: Payer: Self-pay | Admitting: Family Medicine

## 2019-12-19 DIAGNOSIS — Z1231 Encounter for screening mammogram for malignant neoplasm of breast: Secondary | ICD-10-CM

## 2019-12-24 ENCOUNTER — Other Ambulatory Visit: Payer: Self-pay | Admitting: Orthopedic Surgery

## 2019-12-24 DIAGNOSIS — M4807 Spinal stenosis, lumbosacral region: Secondary | ICD-10-CM

## 2019-12-24 DIAGNOSIS — M5441 Lumbago with sciatica, right side: Secondary | ICD-10-CM

## 2019-12-24 DIAGNOSIS — M5416 Radiculopathy, lumbar region: Secondary | ICD-10-CM

## 2019-12-28 ENCOUNTER — Other Ambulatory Visit: Payer: Self-pay | Admitting: Family Medicine

## 2020-01-26 ENCOUNTER — Ambulatory Visit
Admission: RE | Admit: 2020-01-26 | Discharge: 2020-01-26 | Disposition: A | Discharge status: Home / Self Care | Payer: Medicare Other | Source: Ambulatory Visit | Attending: Family Medicine | Admitting: Family Medicine

## 2020-01-26 DIAGNOSIS — Z1231 Encounter for screening mammogram for malignant neoplasm of breast: Secondary | ICD-10-CM

## 2020-04-29 ENCOUNTER — Other Ambulatory Visit: Payer: Self-pay | Admitting: Orthopedic Surgery

## 2020-05-05 ENCOUNTER — Encounter
Admission: RE | Admit: 2020-05-05 | Discharge: 2020-05-05 | Disposition: A | Discharge status: Home / Self Care | Payer: Medicare Other | Source: Ambulatory Visit | Attending: Orthopedic Surgery | Admitting: Orthopedic Surgery

## 2020-05-05 ENCOUNTER — Other Ambulatory Visit: Payer: Self-pay

## 2020-05-05 DIAGNOSIS — Z20822 Contact with and (suspected) exposure to covid-19: Secondary | ICD-10-CM | POA: Insufficient documentation

## 2020-05-05 DIAGNOSIS — Z01818 Encounter for other preprocedural examination: Secondary | ICD-10-CM | POA: Diagnosis present

## 2020-05-05 HISTORY — DX: Gastro-esophageal reflux disease without esophagitis: K21.9

## 2020-05-05 HISTORY — DX: Spinal stenosis, site unspecified: M48.00

## 2020-05-05 NOTE — Patient Instructions (Addendum)
INSTRUCTIONS FOR SURGERY     Your surgery is scheduled for:   Tuesday, June 1ST     To find out your arrival time for the day of surgery,          please call [phone] between 1 pm and 3 pm on :  Friday, MAY 28TH     When you arrive for surgery, report to the [facility].       Do NOT stop on the first floor to register.    REMEMBER: Instructions that are not followed completely may result in serious medical risk,  up to and including death, or upon the discretion of your surgeon and anesthesiologist,            your surgery may need to be rescheduled.  __X__ 1. Do not eat food after midnight the night before your procedure.                    No gum, candy, lozenger, tic tacs, tums or hard candies.                  ABSOLUTELY NOTHING SOLID IN YOUR MOUTH AFTER MIDNIGHT                    You may drink unlimited clear liquids up to 2 hours before you are scheduled to arrive for surgery.                   Do not drink anything within those 2 hours unless you need to take medicine, then take the                   smallest amount you need.  Clear liquids include:  water, apple juice without pulp,                   any flavor Gatorade, Black coffee, black tea.  Sugar may be added but no dairy/ honey /lemon.                        Broth and jello is not considered a clear liquid.  __x__  2. On the morning of surgery, please brush your teeth with toothpaste and water. You may rinse with                  mouthwash if you wish but DO NOT SWALLOW TOOTHPASTE OR MOUTHWASH  __X___3. NO alcohol for 24 hours before or after surgery.  __x___ 4.  Do NOT smoke or use e-cigarettes for 24 HOURS PRIOR TO SURGERY.                      DO NOT use any chewable tobacco products for at least 6 hours prior to surgery.  __x___ 5. If you start any new medication after this appointment and prior to surgery, please  Bring it with you on the day of surgery.  ___x__ 6. Notify your doctor if there is any change in your medical condition, such as fever,  infection, vomitting, diarrhea or any open sores.  __x___ 7.  USE the CHG SOAP as instructed, the night before surgery and the day of surgery.                   Once you have washed with this soap, do NOT use any of the following: Powders, perfumes                    or lotions. Please do not wear make up, hairpins, clips or nail polish. You MAY wear deodorant.                   Men may shave their face and neck.  Women need to shave 48 hours prior to surgery.                   DO NOT wear ANY jewelry on the day of surgery. If there are rings that are too tight to                    remove easily, please address this prior to the surgery day. Piercings need to be removed.                                                                     NO METAL ON YOUR BODY.                    Do NOT bring any valuables.  If you came to Pre-Admit testing then you will not need license,                     insurance card or credit card.  If you will be staying overnight, please either leave your things in                     the car or have your family be responsible for these items.                      IS NOT RESPONSIBLE FOR BELONGINGS OR VALUABLES.  ___X__ 8. DO NOT wear contact lenses on surgery day.  You may not have dentures,                     Hearing aides, contacts or glasses in the operating room. These items can be                    Placed in the Recovery Room to receive immediately after surgery.  __x___ 9. IF YOU ARE SCHEDULED TO GO HOME ON THE SAME DAY, YOU MUST                   Have someone to drive you home and to stay with you  for the first 24 hours.                    Have an arrangement prior to arriving on surgery day.  ___x__ 10. Take the following medications on the morning of surgery with a sip of water:  1. PROTONIX                     2. NORVASC                     3. TRAMADOL, if you need it                     4.                                 __x___ 11.  Follow any instructions provided to you by your surgeon.                        Such as enema, clear liquid bowel prep                      ##PLEASE CONSUME PRESURGICAL DRINK BY 2 HOURS AHEAD OF                        ARRIVAL TO [facility].##  __X__  12. STOP ASPIRIN AND ALL ASPIRIN PRODUCTS NOW.                       THIS INCLUDES BC POWDERS / GOODIES POWDER  __x___ 13. STOP Anti-inflammatories as of: TODAY, MAY 26TH                      This includes IBUPROFEN / MOTRIN / ADVIL / ALEVE/ NAPROXYN                    [facility].  ___X__ 32.   You may continue taking Vitamin D3 but do not take on the morning of surgery.  __X____17.  Continue to take the following medications but do not take on the morning of surgery:                           HYZAR // VITAMIN D  __X____18. If staying overnight, please have appropriate shoes to wear to be able to walk around the unit.                   Wear clean and comfortable clothing to the hospital.  PLEASE BRING A COPY OF YOUR MEDICAL POA AND LIVING WILL SO WE CAN PUT   A COPY INTO YOUR CHART. BRING A CHARGER WITH YOU IF YOU USE YOUR CELL PHONE.

## 2020-05-06 ENCOUNTER — Encounter
Admission: RE | Admit: 2020-05-06 | Discharge: 2020-05-06 | Disposition: A | Discharge status: Home / Self Care | Payer: Medicare Other | Source: Ambulatory Visit | Attending: Orthopedic Surgery | Admitting: Orthopedic Surgery

## 2020-05-06 ENCOUNTER — Other Ambulatory Visit: Payer: Self-pay

## 2020-05-06 DIAGNOSIS — Z01818 Encounter for other preprocedural examination: Secondary | ICD-10-CM | POA: Diagnosis not present

## 2020-05-06 LAB — CBC WITH DIFFERENTIAL/PLATELET
Abs Immature Granulocytes: 0.01 10*3/uL (ref 0.00–0.07)
Basophils Absolute: 0.1 10*3/uL (ref 0.0–0.1)
Basophils Relative: 1 %
Eosinophils Absolute: 0.2 10*3/uL (ref 0.0–0.5)
Eosinophils Relative: 4 %
HCT: 40.2 % (ref 36.0–46.0)
Hemoglobin: 13.7 g/dL (ref 12.0–15.0)
Immature Granulocytes: 0 %
Lymphocytes Relative: 40 %
Lymphs Abs: 1.8 10*3/uL (ref 0.7–4.0)
MCH: 30.4 pg (ref 26.0–34.0)
MCHC: 34.1 g/dL (ref 30.0–36.0)
MCV: 89.1 fL (ref 80.0–100.0)
Monocytes Absolute: 0.4 10*3/uL (ref 0.1–1.0)
Monocytes Relative: 9 %
Neutro Abs: 2.1 10*3/uL (ref 1.7–7.7)
Neutrophils Relative %: 46 %
Platelets: 163 10*3/uL (ref 150–400)
RBC: 4.51 MIL/uL (ref 3.87–5.11)
RDW: 12.2 % (ref 11.5–15.5)
WBC: 4.5 10*3/uL (ref 4.0–10.5)
nRBC: 0 % (ref 0.0–0.2)

## 2020-05-06 LAB — URINALYSIS, ROUTINE W REFLEX MICROSCOPIC
Bacteria, UA: NONE SEEN
Bilirubin Urine: NEGATIVE
Glucose, UA: NEGATIVE mg/dL
Hgb urine dipstick: NEGATIVE
Ketones, ur: NEGATIVE mg/dL
Nitrite: NEGATIVE
Protein, ur: NEGATIVE mg/dL
Specific Gravity, Urine: 1.003 — ABNORMAL LOW (ref 1.005–1.030)
pH: 7 (ref 5.0–8.0)

## 2020-05-06 LAB — TYPE AND SCREEN
ABO/RH(D): A POS
Antibody Screen: NEGATIVE

## 2020-05-06 LAB — COMPREHENSIVE METABOLIC PANEL
ALT: 11 U/L (ref 0–44)
AST: 16 U/L (ref 15–41)
Albumin: 4.7 g/dL (ref 3.5–5.0)
Alkaline Phosphatase: 67 U/L (ref 38–126)
Anion gap: 7 (ref 5–15)
BUN: 18 mg/dL (ref 8–23)
CO2: 29 mmol/L (ref 22–32)
Calcium: 10.1 mg/dL (ref 8.9–10.3)
Chloride: 102 mmol/L (ref 98–111)
Creatinine, Ser: 0.82 mg/dL (ref 0.44–1.00)
GFR calc Af Amer: >60 mL/min (ref >60)
GFR calc non Af Amer: >60 mL/min (ref >60)
Glucose, Bld: 90 mg/dL (ref 70–99)
Potassium: 3.7 mmol/L (ref 3.5–5.1)
Sodium: 138 mmol/L (ref 135–145)
Total Bilirubin: 1 mg/dL (ref 0.3–1.2)
Total Protein: 7.2 g/dL (ref 6.5–8.1)

## 2020-05-06 LAB — SURGICAL PCR SCREEN
MRSA, PCR: NEGATIVE
Staphylococcus aureus: NEGATIVE

## 2020-05-07 ENCOUNTER — Other Ambulatory Visit: Payer: Self-pay

## 2020-05-07 ENCOUNTER — Other Ambulatory Visit
Admission: RE | Admit: 2020-05-07 | Discharge: 2020-05-07 | Disposition: A | Discharge status: Home / Self Care | Payer: Medicare Other | Source: Ambulatory Visit | Attending: Orthopedic Surgery | Admitting: Orthopedic Surgery

## 2020-05-07 DIAGNOSIS — Z01818 Encounter for other preprocedural examination: Secondary | ICD-10-CM | POA: Diagnosis not present

## 2020-05-08 LAB — SARS CORONAVIRUS 2 (TAT 6-24 HRS): SARS Coronavirus 2: NEGATIVE

## 2020-05-11 ENCOUNTER — Inpatient Hospital Stay: Payer: Medicare Other | Admitting: Certified Registered"

## 2020-05-11 ENCOUNTER — Encounter: Payer: Self-pay | Admitting: Orthopedic Surgery

## 2020-05-11 ENCOUNTER — Inpatient Hospital Stay: Payer: Medicare Other

## 2020-05-11 ENCOUNTER — Other Ambulatory Visit: Payer: Self-pay

## 2020-05-11 ENCOUNTER — Inpatient Hospital Stay
Admission: RE | Admit: 2020-05-11 | Discharge: 2020-05-13 | DRG: 470 | Disposition: A | Discharge status: Home Health | Payer: Medicare Other | Attending: Orthopedic Surgery | Admitting: Orthopedic Surgery

## 2020-05-11 ENCOUNTER — Observation Stay: Payer: Medicare Other

## 2020-05-11 ENCOUNTER — Encounter
Admission: RE | Disposition: A | Discharge status: Home Health | Payer: Self-pay | Source: Home / Self Care | Attending: Orthopedic Surgery

## 2020-05-11 DIAGNOSIS — I10 Essential (primary) hypertension: Secondary | ICD-10-CM | POA: Diagnosis present

## 2020-05-11 DIAGNOSIS — G8918 Other acute postprocedural pain: Secondary | ICD-10-CM

## 2020-05-11 DIAGNOSIS — I2510 Atherosclerotic heart disease of native coronary artery without angina pectoris: Secondary | ICD-10-CM | POA: Diagnosis present

## 2020-05-11 DIAGNOSIS — E7800 Pure hypercholesterolemia, unspecified: Secondary | ICD-10-CM | POA: Diagnosis present

## 2020-05-11 DIAGNOSIS — Z8249 Family history of ischemic heart disease and other diseases of the circulatory system: Secondary | ICD-10-CM

## 2020-05-11 DIAGNOSIS — K219 Gastro-esophageal reflux disease without esophagitis: Secondary | ICD-10-CM | POA: Diagnosis present

## 2020-05-11 DIAGNOSIS — M1611 Unilateral primary osteoarthritis, right hip: Principal | ICD-10-CM | POA: Diagnosis present

## 2020-05-11 DIAGNOSIS — Z800 Family history of malignant neoplasm of digestive organs: Secondary | ICD-10-CM

## 2020-05-11 DIAGNOSIS — Z79899 Other long term (current) drug therapy: Secondary | ICD-10-CM

## 2020-05-11 DIAGNOSIS — Z833 Family history of diabetes mellitus: Secondary | ICD-10-CM

## 2020-05-11 DIAGNOSIS — Z7982 Long term (current) use of aspirin: Secondary | ICD-10-CM

## 2020-05-11 DIAGNOSIS — Z9049 Acquired absence of other specified parts of digestive tract: Secondary | ICD-10-CM

## 2020-05-11 DIAGNOSIS — E785 Hyperlipidemia, unspecified: Secondary | ICD-10-CM | POA: Diagnosis present

## 2020-05-11 DIAGNOSIS — Z85828 Personal history of other malignant neoplasm of skin: Secondary | ICD-10-CM

## 2020-05-11 DIAGNOSIS — Z419 Encounter for procedure for purposes other than remedying health state, unspecified: Secondary | ICD-10-CM

## 2020-05-11 DIAGNOSIS — M8580 Other specified disorders of bone density and structure, unspecified site: Secondary | ICD-10-CM | POA: Diagnosis present

## 2020-05-11 DIAGNOSIS — Z96641 Presence of right artificial hip joint: Secondary | ICD-10-CM

## 2020-05-11 HISTORY — PX: TOTAL HIP ARTHROPLASTY: SHX124

## 2020-05-11 LAB — CBC
HCT: 37.8 % (ref 36.0–46.0)
Hemoglobin: 13.2 g/dL (ref 12.0–15.0)
MCH: 30.6 pg (ref 26.0–34.0)
MCHC: 34.9 g/dL (ref 30.0–36.0)
MCV: 87.7 fL (ref 80.0–100.0)
Platelets: 133 10*3/uL — ABNORMAL LOW (ref 150–400)
RBC: 4.31 MIL/uL (ref 3.87–5.11)
RDW: 12.1 % (ref 11.5–15.5)
WBC: 5.7 10*3/uL (ref 4.0–10.5)
nRBC: 0 % (ref 0.0–0.2)

## 2020-05-11 LAB — CREATININE, SERUM
Creatinine, Ser: 0.86 mg/dL (ref 0.44–1.00)
GFR calc Af Amer: >60 mL/min (ref >60)
GFR calc non Af Amer: >60 mL/min (ref >60)

## 2020-05-11 LAB — ABO/RH: ABO/RH(D): A POS

## 2020-05-11 SURGERY — ARTHROPLASTY, HIP, TOTAL, ANTERIOR APPROACH
Anesthesia: Spinal | Site: Hip | Laterality: Right

## 2020-05-11 MED ORDER — VITAMIN D3 25 MCG (1000 UNIT) PO TABS
2000.0000 [IU] | ORAL_TABLET | Freq: Every day | ORAL | Status: DC
  Administered 2020-05-12 – 2020-05-13 (×2): 2000 [IU] via ORAL
  Filled 2020-05-11 (×4): qty 2

## 2020-05-11 MED ORDER — PHENOL 1.4 % MT LIQD
1.0000 | OROMUCOSAL | Status: DC | PRN
  Filled 2020-05-11: qty 177

## 2020-05-11 MED ORDER — DOCUSATE SODIUM 100 MG PO CAPS
100.0000 mg | ORAL_CAPSULE | Freq: Two times a day (BID) | ORAL | Status: DC
  Administered 2020-05-11 – 2020-05-13 (×4): 100 mg via ORAL
  Filled 2020-05-11 (×5): qty 1

## 2020-05-11 MED ORDER — MAGNESIUM CITRATE PO SOLN
1.0000 | Freq: Once | ORAL | Status: DC | PRN
  Filled 2020-05-11: qty 296

## 2020-05-11 MED ORDER — ASPIRIN 81 MG PO CHEW
81.0000 mg | CHEWABLE_TABLET | Freq: Every day | ORAL | Status: DC
  Administered 2020-05-11 – 2020-05-13 (×3): 81 mg via ORAL
  Filled 2020-05-11 (×3): qty 1

## 2020-05-11 MED ORDER — ACETAMINOPHEN 325 MG PO TABS: 325.0000 mg | ORAL_TABLET | Freq: Four times a day (QID) | ORAL | Status: DC | PRN

## 2020-05-11 MED ORDER — METHOCARBAMOL 500 MG PO TABS
500.0000 mg | ORAL_TABLET | Freq: Four times a day (QID) | ORAL | Status: DC | PRN
  Administered 2020-05-11: 500 mg via ORAL

## 2020-05-11 MED ORDER — EPHEDRINE SULFATE 50 MG/ML IJ SOLN
INTRAMUSCULAR | Status: DC | PRN
  Administered 2020-05-11: 5 mg via INTRAVENOUS
  Administered 2020-05-11: 10 mg via INTRAVENOUS
  Administered 2020-05-11: 5 mg via INTRAVENOUS
  Administered 2020-05-11: 10 mg via INTRAVENOUS

## 2020-05-11 MED ORDER — ONDANSETRON HCL 4 MG/2ML IJ SOLN
INTRAMUSCULAR | Status: DC | PRN
  Administered 2020-05-11: 4 mg via INTRAVENOUS

## 2020-05-11 MED ORDER — HYDROCODONE-ACETAMINOPHEN 5-325 MG PO TABS
1.0000 | ORAL_TABLET | ORAL | Status: DC | PRN
  Administered 2020-05-12 – 2020-05-13 (×2): 1 via ORAL
  Filled 2020-05-11 (×2): qty 1

## 2020-05-11 MED ORDER — METHOCARBAMOL 1000 MG/10ML IJ SOLN
500.0000 mg | Freq: Four times a day (QID) | INTRAVENOUS | Status: DC | PRN
  Filled 2020-05-11: qty 5

## 2020-05-11 MED ORDER — CHLORHEXIDINE GLUCONATE 0.12 % MT SOLN
OROMUCOSAL | Status: AC
  Administered 2020-05-11: 15 mL via OROMUCOSAL
  Filled 2020-05-11: qty 15

## 2020-05-11 MED ORDER — BUPIVACAINE-EPINEPHRINE 0.25% -1:200000 IJ SOLN
INTRAMUSCULAR | Status: DC | PRN
  Administered 2020-05-11: 30 mL

## 2020-05-11 MED ORDER — ORAL CARE MOUTH RINSE: 15.0000 mL | Freq: Once | OROMUCOSAL | Status: AC

## 2020-05-11 MED ORDER — SODIUM CHLORIDE 0.9 % IV SOLN
INTRAVENOUS | Status: DC | PRN
  Administered 2020-05-11: 50 ug/min via INTRAVENOUS

## 2020-05-11 MED ORDER — ONDANSETRON HCL 4 MG PO TABS: 4.0000 mg | ORAL_TABLET | Freq: Four times a day (QID) | ORAL | Status: DC | PRN

## 2020-05-11 MED ORDER — LOSARTAN POTASSIUM-HCTZ 100-12.5 MG PO TABS: 0.5000 | ORAL_TABLET | Freq: Every day | ORAL | Status: DC

## 2020-05-11 MED ORDER — BUPIVACAINE LIPOSOME 1.3 % IJ SUSP
INTRAMUSCULAR | Status: AC
  Filled 2020-05-11: qty 20

## 2020-05-11 MED ORDER — BUPIVACAINE HCL (PF) 0.5 % IJ SOLN
INTRAMUSCULAR | Status: DC | PRN
  Administered 2020-05-11: 3 mL

## 2020-05-11 MED ORDER — HYDROCODONE-ACETAMINOPHEN 7.5-325 MG PO TABS
ORAL_TABLET | ORAL | Status: AC
  Filled 2020-05-11: qty 1

## 2020-05-11 MED ORDER — NEOMYCIN-POLYMYXIN B GU 40-200000 IR SOLN
Status: DC | PRN
  Administered 2020-05-11: 2 mL

## 2020-05-11 MED ORDER — MORPHINE SULFATE (PF) 4 MG/ML IV SOLN
INTRAVENOUS | Status: AC
  Filled 2020-05-11: qty 1

## 2020-05-11 MED ORDER — PHENYLEPHRINE HCL (PRESSORS) 10 MG/ML IV SOLN
INTRAVENOUS | Status: DC | PRN
  Administered 2020-05-11: 200 ug via INTRAVENOUS
  Administered 2020-05-11: 100 ug via INTRAVENOUS

## 2020-05-11 MED ORDER — MENTHOL 3 MG MT LOZG
1.0000 | LOZENGE | OROMUCOSAL | Status: DC | PRN
  Filled 2020-05-11: qty 9

## 2020-05-11 MED ORDER — FENTANYL CITRATE (PF) 100 MCG/2ML IJ SOLN
INTRAMUSCULAR | Status: DC | PRN
  Administered 2020-05-11: 25 ug via INTRAVENOUS

## 2020-05-11 MED ORDER — METHOCARBAMOL 500 MG PO TABS
ORAL_TABLET | ORAL | Status: AC
  Filled 2020-05-11: qty 1

## 2020-05-11 MED ORDER — CEFAZOLIN SODIUM-DEXTROSE 2-4 GM/100ML-% IV SOLN
2.0000 g | Freq: Four times a day (QID) | INTRAVENOUS | Status: AC
  Administered 2020-05-11 (×2): 2 g via INTRAVENOUS
  Filled 2020-05-11: qty 100

## 2020-05-11 MED ORDER — MIDAZOLAM HCL 5 MG/5ML IJ SOLN
INTRAMUSCULAR | Status: DC | PRN
  Administered 2020-05-11 (×2): 1 mg via INTRAVENOUS

## 2020-05-11 MED ORDER — ALUM & MAG HYDROXIDE-SIMETH 200-200-20 MG/5ML PO SUSP: 30.0000 mL | ORAL | Status: DC | PRN

## 2020-05-11 MED ORDER — CEFAZOLIN SODIUM-DEXTROSE 2-4 GM/100ML-% IV SOLN
INTRAVENOUS | Status: AC
  Filled 2020-05-11: qty 100

## 2020-05-11 MED ORDER — BUPIVACAINE HCL (PF) 0.25 % IJ SOLN
INTRAMUSCULAR | Status: AC
  Filled 2020-05-11: qty 30

## 2020-05-11 MED ORDER — PROPOFOL 500 MG/50ML IV EMUL
INTRAVENOUS | Status: DC | PRN
  Administered 2020-05-11: 75 ug/kg/min via INTRAVENOUS

## 2020-05-11 MED ORDER — ATORVASTATIN CALCIUM 20 MG PO TABS
20.0000 mg | ORAL_TABLET | Freq: Every day | ORAL | Status: DC
  Administered 2020-05-11 – 2020-05-12 (×2): 20 mg via ORAL
  Filled 2020-05-11 (×3): qty 1

## 2020-05-11 MED ORDER — DIPHENHYDRAMINE HCL 12.5 MG/5ML PO ELIX
12.5000 mg | ORAL_SOLUTION | ORAL | Status: DC | PRN
  Filled 2020-05-11: qty 10

## 2020-05-11 MED ORDER — HYDROCHLOROTHIAZIDE 10 MG/ML ORAL SUSPENSION
6.2500 mg | Freq: Every day | ORAL | Status: DC
  Administered 2020-05-12 – 2020-05-13 (×2): 6.25 mg via ORAL
  Filled 2020-05-11 (×4): qty 1.25

## 2020-05-11 MED ORDER — SODIUM CHLORIDE 0.9 % IV SOLN: INTRAVENOUS | Status: DC

## 2020-05-11 MED ORDER — HYDROCODONE-ACETAMINOPHEN 7.5-325 MG PO TABS
1.0000 | ORAL_TABLET | ORAL | Status: DC | PRN
  Administered 2020-05-11: 2 via ORAL
  Filled 2020-05-11 (×3): qty 2

## 2020-05-11 MED ORDER — ONDANSETRON HCL 4 MG/2ML IJ SOLN: 4.0000 mg | Freq: Once | INTRAMUSCULAR | Status: DC | PRN

## 2020-05-11 MED ORDER — SODIUM CHLORIDE FLUSH 0.9 % IV SOLN
INTRAVENOUS | Status: AC
  Filled 2020-05-11: qty 40

## 2020-05-11 MED ORDER — SODIUM CHLORIDE (PF) 0.9 % IJ SOLN
INTRAMUSCULAR | Status: AC
  Filled 2020-05-11: qty 50

## 2020-05-11 MED ORDER — NEOMYCIN-POLYMYXIN B GU 40-200000 IR SOLN
Status: AC
  Filled 2020-05-11: qty 4

## 2020-05-11 MED ORDER — FENTANYL CITRATE (PF) 100 MCG/2ML IJ SOLN
INTRAMUSCULAR | Status: AC
  Filled 2020-05-11: qty 2

## 2020-05-11 MED ORDER — MAGNESIUM HYDROXIDE 400 MG/5ML PO SUSP
30.0000 mL | Freq: Every day | ORAL | Status: DC | PRN
  Filled 2020-05-11: qty 30

## 2020-05-11 MED ORDER — TRAMADOL HCL 50 MG PO TABS
50.0000 mg | ORAL_TABLET | Freq: Four times a day (QID) | ORAL | Status: DC
  Administered 2020-05-11 – 2020-05-13 (×9): 50 mg via ORAL
  Filled 2020-05-11 (×8): qty 1

## 2020-05-11 MED ORDER — HYDROCODONE-ACETAMINOPHEN 7.5-325 MG PO TABS
ORAL_TABLET | ORAL | Status: AC
  Administered 2020-05-11: 2 via ORAL
  Filled 2020-05-11: qty 2

## 2020-05-11 MED ORDER — PANTOPRAZOLE SODIUM 40 MG PO TBEC
40.0000 mg | DELAYED_RELEASE_TABLET | Freq: Every day | ORAL | Status: DC
  Administered 2020-05-12 – 2020-05-13 (×2): 40 mg via ORAL
  Filled 2020-05-11 (×3): qty 1

## 2020-05-11 MED ORDER — ONDANSETRON HCL 4 MG/2ML IJ SOLN: 4.0000 mg | Freq: Four times a day (QID) | INTRAMUSCULAR | Status: DC | PRN

## 2020-05-11 MED ORDER — CEFAZOLIN SODIUM-DEXTROSE 2-4 GM/100ML-% IV SOLN
2.0000 g | INTRAVENOUS | Status: AC
  Administered 2020-05-11: 2 g via INTRAVENOUS

## 2020-05-11 MED ORDER — ACETAMINOPHEN 10 MG/ML IV SOLN
INTRAVENOUS | Status: DC | PRN
  Administered 2020-05-11: 1000 mg via INTRAVENOUS

## 2020-05-11 MED ORDER — CHLORHEXIDINE GLUCONATE 0.12 % MT SOLN: 15.0000 mL | Freq: Once | OROMUCOSAL | Status: AC

## 2020-05-11 MED ORDER — BISACODYL 10 MG RE SUPP
10.0000 mg | Freq: Every day | RECTAL | Status: DC | PRN
  Administered 2020-05-13: 10 mg via RECTAL
  Filled 2020-05-11 (×2): qty 1

## 2020-05-11 MED ORDER — ENOXAPARIN SODIUM 40 MG/0.4ML ~~LOC~~ SOLN
40.0000 mg | SUBCUTANEOUS | Status: DC
  Administered 2020-05-12 – 2020-05-13 (×2): 40 mg via SUBCUTANEOUS
  Filled 2020-05-11 (×2): qty 0.4

## 2020-05-11 MED ORDER — LOSARTAN POTASSIUM 50 MG PO TABS
50.0000 mg | ORAL_TABLET | Freq: Every day | ORAL | Status: DC
  Administered 2020-05-12 – 2020-05-13 (×2): 50 mg via ORAL
  Filled 2020-05-11: qty 1

## 2020-05-11 MED ORDER — ACETAMINOPHEN 10 MG/ML IV SOLN
INTRAVENOUS | Status: AC
  Filled 2020-05-11: qty 100

## 2020-05-11 MED ORDER — LACTATED RINGERS IV SOLN: INTRAVENOUS | Status: DC

## 2020-05-11 MED ORDER — EPINEPHRINE PF 1 MG/ML IJ SOLN
INTRAMUSCULAR | Status: AC
  Filled 2020-05-11: qty 1

## 2020-05-11 MED ORDER — FENTANYL CITRATE (PF) 100 MCG/2ML IJ SOLN: 25.0000 ug | INTRAMUSCULAR | Status: DC | PRN

## 2020-05-11 MED ORDER — METOCLOPRAMIDE HCL 10 MG PO TABS: 5.0000 mg | ORAL_TABLET | Freq: Three times a day (TID) | ORAL | Status: DC | PRN

## 2020-05-11 MED ORDER — MORPHINE SULFATE (PF) 2 MG/ML IV SOLN
0.5000 mg | INTRAVENOUS | Status: DC | PRN
  Administered 2020-05-11: 1 mg via INTRAVENOUS

## 2020-05-11 MED ORDER — PROPOFOL 500 MG/50ML IV EMUL
INTRAVENOUS | Status: AC
  Filled 2020-05-11: qty 50

## 2020-05-11 MED ORDER — METOCLOPRAMIDE HCL 5 MG/ML IJ SOLN: 5.0000 mg | Freq: Three times a day (TID) | INTRAMUSCULAR | Status: DC | PRN

## 2020-05-11 MED ORDER — MIDAZOLAM HCL 2 MG/2ML IJ SOLN
INTRAMUSCULAR | Status: AC
  Filled 2020-05-11: qty 2

## 2020-05-11 MED ORDER — LORATADINE 10 MG PO TABS
10.0000 mg | ORAL_TABLET | Freq: Every day | ORAL | Status: DC
  Administered 2020-05-12 – 2020-05-13 (×2): 10 mg via ORAL
  Filled 2020-05-11 (×3): qty 1

## 2020-05-11 MED ORDER — AMLODIPINE BESYLATE 5 MG PO TABS
5.0000 mg | ORAL_TABLET | Freq: Every day | ORAL | Status: DC
  Administered 2020-05-12 – 2020-05-13 (×2): 5 mg via ORAL
  Filled 2020-05-11 (×3): qty 1

## 2020-05-11 MED ORDER — SODIUM CHLORIDE 0.9 % IV SOLN
INTRAVENOUS | Status: DC | PRN
  Administered 2020-05-11: 60 mL

## 2020-05-11 MED ORDER — ZOLPIDEM TARTRATE 5 MG PO TABS
5.0000 mg | ORAL_TABLET | Freq: Every evening | ORAL | Status: DC | PRN
  Filled 2020-05-11: qty 1

## 2020-05-11 MED ORDER — TRAMADOL HCL 50 MG PO TABS
ORAL_TABLET | ORAL | Status: AC
  Filled 2020-05-11: qty 1

## 2020-05-11 SURGICAL SUPPLY — 60 items
BLADE SAGITTAL AGGR TOOTH XLG (BLADE) ×2 IMPLANT
BNDG COHESIVE 6X5 TAN STRL LF (GAUZE/BANDAGES/DRESSINGS) ×6 IMPLANT
CANISTER SUCT 1200ML W/VALVE (MISCELLANEOUS) ×2 IMPLANT
CANISTER WOUND CARE 500ML ATS (WOUND CARE) ×2 IMPLANT
CHLORAPREP W/TINT 26 (MISCELLANEOUS) ×3 IMPLANT
COVER BACK TABLE REUSABLE LG (DRAPES) ×2 IMPLANT
COVER WAND RF STERILE (DRAPES) ×2 IMPLANT
DRAPE 3/4 80X56 (DRAPES) ×6 IMPLANT
DRAPE C-ARM XRAY 36X54 (DRAPES) ×2 IMPLANT
DRAPE INCISE IOBAN 66X60 STRL (DRAPES) IMPLANT
DRAPE POUCH INSTRU U-SHP 10X18 (DRAPES) ×2 IMPLANT
DRESSING SURGICEL FIBRLLR 1X2 (HEMOSTASIS) ×2 IMPLANT
DRSG OPSITE POSTOP 4X8 (GAUZE/BANDAGES/DRESSINGS) ×4 IMPLANT
DRSG SURGICEL FIBRILLAR 1X2 (HEMOSTASIS) ×4
ELECT BLADE 6.5 EXT (BLADE) ×2 IMPLANT
ELECT REM PT RETURN 9FT ADLT (ELECTROSURGICAL) ×2
ELECTRODE REM PT RTRN 9FT ADLT (ELECTROSURGICAL) ×1 IMPLANT
GLOVE BIOGEL PI IND STRL 9 (GLOVE) ×1 IMPLANT
GLOVE BIOGEL PI INDICATOR 9 (GLOVE) ×1
GLOVE SURG SYN 9.0  PF PI (GLOVE) ×4
GLOVE SURG SYN 9.0 PF PI (GLOVE) ×2 IMPLANT
GOWN SRG 2XL LVL 4 RGLN SLV (GOWNS) ×1 IMPLANT
GOWN STRL NON-REIN 2XL LVL4 (GOWNS) ×2
GOWN STRL REUS W/ TWL LRG LVL3 (GOWN DISPOSABLE) ×1 IMPLANT
GOWN STRL REUS W/TWL LRG LVL3 (GOWN DISPOSABLE) ×2
HEMOVAC 400CC 10FR (MISCELLANEOUS) IMPLANT
HIP FEM HD L 28 (Head) ×1 IMPLANT
HOLDER FOLEY CATH W/STRAP (MISCELLANEOUS) ×2 IMPLANT
HOOD PEEL AWAY FLYTE STAYCOOL (MISCELLANEOUS) ×3 IMPLANT
KIT PREVENA INCISION MGT 13 (CANNISTER) ×2 IMPLANT
LINER DUAL MOB 50MM (Liner) ×1 IMPLANT
MAT ABSORB  FLUID 56X50 GRAY (MISCELLANEOUS) ×2
MAT ABSORB FLUID 56X50 GRAY (MISCELLANEOUS) ×1 IMPLANT
NDL SAFETY ECLIPSE 18X1.5 (NEEDLE) ×1 IMPLANT
NDL SPNL 20GX3.5 QUINCKE YW (NEEDLE) ×2 IMPLANT
NEEDLE HYPO 18GX1.5 SHARP (NEEDLE) ×2
NEEDLE SPNL 20GX3.5 QUINCKE YW (NEEDLE) ×4 IMPLANT
NS IRRIG 1000ML POUR BTL (IV SOLUTION) ×2 IMPLANT
PACK HIP COMPR (MISCELLANEOUS) ×2 IMPLANT
SCALPEL PROTECTED #10 DISP (BLADE) ×4 IMPLANT
SHELL ACETABULAR SZ0 50 DME (Shell) ×1 IMPLANT
SOL PREP PVP 2OZ (MISCELLANEOUS) ×2
SOLUTION PREP PVP 2OZ (MISCELLANEOUS) ×1 IMPLANT
SPONGE DRAIN TRACH 4X4 STRL 2S (GAUZE/BANDAGES/DRESSINGS) ×2 IMPLANT
STAPLER SKIN PROX 35W (STAPLE) ×2 IMPLANT
STEM FEMORAL 4 STD COLLARED (Stem) ×1 IMPLANT
STRAP SAFETY 5IN WIDE (MISCELLANEOUS) ×2 IMPLANT
SUT DVC 2 QUILL PDO  T11 36X36 (SUTURE) ×2
SUT DVC 2 QUILL PDO T11 36X36 (SUTURE) ×1 IMPLANT
SUT SILK 0 (SUTURE) ×2
SUT SILK 0 30XBRD TIE 6 (SUTURE) ×1 IMPLANT
SUT V-LOC 90 ABS DVC 3-0 CL (SUTURE) ×2 IMPLANT
SUT VIC AB 1 CT1 36 (SUTURE) ×2 IMPLANT
SYR 20ML LL LF (SYRINGE) ×2 IMPLANT
SYR 30ML LL (SYRINGE) ×2 IMPLANT
SYR 50ML LL SCALE MARK (SYRINGE) ×4 IMPLANT
SYR BULB IRRIG 60ML STRL (SYRINGE) ×2 IMPLANT
TAPE MICROFOAM 4IN (TAPE) ×2 IMPLANT
TOWEL OR 17X26 4PK STRL BLUE (TOWEL DISPOSABLE) ×2 IMPLANT
TRAY FOLEY MTR SLVR 16FR STAT (SET/KITS/TRAYS/PACK) ×2 IMPLANT

## 2020-05-11 NOTE — Evaluation (Signed)
Physical Therapy Evaluation Patient Details Name: Natasha [name] MRN: [id number] DOB: 09-23-1946 Today's Date: 05/11/2020   History of Present Illness  Pt is a 74 yo female diagnosed with primary osteoarthritis of right hip and is s/p elective R THA.  PMH includes: HTN, GERD, spinal stenosis, and R hip bursitis.    Clinical Impression  Pt pleasant and motivated to participate during the session and performed very well especially considering POD#0 status.  Pt required only minimal assist with sup to sit and no physical assistance with transfers or ambulation.  Pt reported mild dizziness upon coming to sitting with BP taken in sitting and found to be slightly higher than previous supine BP.  Pt's dizziness quickly resolved and pt was able to ambulate with slow cadence but steady without LOB and with no significant increase in R hip pain.  Pt should make very good progress while in acute care and will benefit from HHPT services upon discharge to safely address deficits listed in patient problem list for decreased caregiver assistance and eventual return to PLOF.      Follow Up Recommendations Home health PT;Supervision - Intermittent    Equipment Recommendations  Rolling walker with 5" wheels    Recommendations for Other Services       Precautions / Restrictions Precautions Precautions: Fall;Anterior Hip Precaution Booklet Issued: Yes (comment) Restrictions Weight Bearing Restrictions: Yes RLE Weight Bearing: Weight bearing as tolerated      Mobility  Bed Mobility Overal bed mobility: Needs Assistance Bed Mobility: Supine to Sit;Sit to Supine     Supine to sit: Min assist Sit to supine: Supervision   General bed mobility comments: Min A for trunk to upright position during sup to sit  Transfers Overall transfer level: Needs assistance Equipment used: Rolling walker (2 wheeled) Transfers: Sit to/from Stand Sit to Stand: Min guard         General transfer comment: Mod  verbal cues for sequencing with good eccentric and concentric control and no instability noted  Ambulation/Gait Ambulation/Gait assistance: Min guard Gait Distance (Feet): 8 Feet Assistive device: Rolling walker (2 wheeled) Gait Pattern/deviations: Step-to pattern;Decreased stance time - right;Decreased step length - left Gait velocity: decreased   General Gait Details: Step-to pattern only mildy antalgic on the R hip with good stability throughout  Stairs            Wheelchair Mobility    Modified Rankin (Stroke Patients Only)       Balance Overall balance assessment: Needs assistance Sitting-balance support: Feet unsupported Sitting balance-Leahy Scale: Normal     Standing balance support: Bilateral upper extremity supported Standing balance-Leahy Scale: Good                               Pertinent Vitals/Pain Pain Assessment: 0-10 Pain Score: 7  Pain Location: R hip Pain Descriptors / Indicators: Aching;Sore Pain Intervention(s): Premedicated before session;Monitored during session    [place] expects to be discharged to:: Private residence Living Arrangements: Spouse/significant other Available Help at Discharge: Family;Friend(s);Available 24 hours/day Type of Home: House Home Access: Stairs to enter Entrance Stairs-Rails: None Entrance Stairs-Number of Steps: 1+1 small threshold steps Home Layout: One level Home Equipment: Walker - 4 wheels Additional Comments: Elevated toilet has armrests; pt has 4 daughters that will rotate providing 24/7 supervision    Prior Function Level of Independence: Independent         Comments: Ind amb community distances without an  AD, no fall history, Ind with ADLs     Hand Dominance        Extremity/Trunk Assessment   Upper Extremity Assessment Upper Extremity Assessment: Overall WFL for tasks assessed    Lower Extremity Assessment Lower Extremity Assessment: Generalized  weakness;RLE deficits/detail RLE Deficits / Details: BLE ankle strength and AROM WNL and sensation to light touch intact RLE: Unable to fully assess due to pain RLE Sensation: WNL       Communication   Communication: No difficulties  Cognition Arousal/Alertness: Awake/alert Behavior During Therapy: WFL for tasks assessed/performed Overall Cognitive Status: Within Functional Limits for tasks assessed                                        General Comments      Exercises Total Joint Exercises Ankle Circles/Pumps: AROM;Both;10 reps Quad Sets: Strengthening;Both;10 reps Gluteal Sets: Strengthening;Both;10 reps Hip ABduction/ADduction: AROM;Both;5 reps Long Arc Quad: AROM;Strengthening;Both;10 reps Knee Flexion: AROM;Strengthening;Both;10 reps Marching in Standing: AROM;Strengthening;Both;5 reps;Standing Other Exercises Other Exercises: HEP education per handout   Assessment/Plan    PT Assessment Patient needs continued PT services  PT Problem List Decreased strength;Decreased activity tolerance;Decreased balance;Decreased mobility;Decreased knowledge of use of DME;Pain;Decreased knowledge of precautions       PT Treatment Interventions DME instruction;Gait training;Stair training;Functional mobility training;Therapeutic activities;Therapeutic exercise;Balance training;Patient/family education    PT Goals (Current goals can be found in the Care Plan section)  Acute Rehab PT Goals Patient Stated Goal: To move and bend better without pain PT Goal Formulation: With patient Time For Goal Achievement: 05/24/20 Potential to Achieve Goals: Good    Frequency BID   Barriers to discharge        Co-evaluation               AM-PAC PT "6 Clicks" Mobility  Outcome Measure Help needed turning from your back to your side while in a flat bed without using bedrails?: A Little Help needed moving from lying on your back to sitting on the side of a flat bed  without using bedrails?: A Little Help needed moving to and from a bed to a chair (including a wheelchair)?: A Little Help needed standing up from a chair using your arms (e.g., wheelchair or bedside chair)?: A Little Help needed to walk in hospital room?: A Little Help needed climbing 3-5 steps with a railing? : A Lot 6 Click Score: 17    End of Session Equipment Utilized During Treatment: Gait belt Activity Tolerance: Patient tolerated treatment well Patient left: in bed;with call bell/phone within reach;with nursing/sitter in room(No bed alarm required per nursing in PACU) Nurse Communication: Mobility status PT Visit Diagnosis: Muscle weakness (generalized) (M62.81);Other abnormalities of gait and mobility (R26.89);Pain Pain - Right/Left: Right Pain - part of body: Hip    Time: 1629-1700 PT Time Calculation (min) (ACUTE ONLY): 31 min   Charges:   PT Evaluation $PT Eval Moderate Complexity: 1 Mod PT Treatments $Therapeutic Exercise: 8-22 mins        D. Royetta Asal PT, DPT 05/11/20, 5:21 PM

## 2020-05-11 NOTE — Anesthesia Preprocedure Evaluation (Addendum)
Anesthesia Evaluation  Patient identified by MRN, date of birth, ID band Patient awake    Reviewed: Allergy & Precautions, NPO status , Patient's Chart, lab work & pertinent test results  History of Anesthesia Complications Negative for: history of anesthetic complications  Airway Mallampati: II       Dental   Pulmonary neg sleep apnea, neg COPD, Not current smoker,           Cardiovascular hypertension, Pt. on medications (-) Past MI and (-) CHF (-) dysrhythmias (-) Valvular Problems/Murmurs     Neuro/Psych neg Seizures    GI/Hepatic Neg liver ROS, GERD  Medicated and Controlled,  Endo/Other  neg diabetes  Renal/GU negative Renal ROS     Musculoskeletal   Abdominal   Peds  Hematology   Anesthesia Other Findings   Reproductive/Obstetrics                            Anesthesia Physical Anesthesia Plan  ASA: III  Anesthesia Plan: Spinal   Post-op Pain Management:    Induction:   PONV Risk Score and Plan:   Airway Management Planned:   Additional Equipment:   Intra-op Plan:   Post-operative Plan:   Informed Consent: I have reviewed the patients History and Physical, chart, labs and discussed the procedure including the risks, benefits and alternatives for the proposed anesthesia with the patient or authorized representative who has indicated his/her understanding and acceptance.       Plan Discussed with:   Anesthesia Plan Comments:         Anesthesia Quick Evaluation

## 2020-05-11 NOTE — Anesthesia Procedure Notes (Signed)
Spinal  Patient location during procedure: OR Start time: 05/11/2020 10:35 AM End time: 05/11/2020 10:40 AM Staffing Performed: resident/CRNA  Resident/CRNA: Natasha Mead, CRNA Preanesthetic Checklist Completed: patient identified, IV checked, site marked, risks and benefits discussed, surgical consent, monitors and equipment checked, pre-op evaluation and timeout performed Spinal Block Patient position: sitting Prep: ChloraPrep Patient monitoring: cardiac monitor, continuous pulse ox, blood pressure and heart rate Approach: midline Location: L4-5 Injection technique: single-shot Needle Needle type: Pencan  Needle gauge: 24 G Needle length: 10 cm

## 2020-05-11 NOTE — Plan of Care (Signed)

## 2020-05-11 NOTE — H&P (Signed)
Chief Complaint  Patient presents with  . Follow-up  H&P Right total hip arthroplasty, scheduled for 05/11/2020   Natasha [name] is a 74 y.o. female who presents today for her surgical history and physical for upcoming right total hip arthroplasty. Surgery is scheduled with Dr. Rudene Christians on 05/11/2020. Pain score in the right hip at today's visit is an 8 out of 10. The patient is taking tramadol as needed for discomfort at this time. She denies any recent falls or trauma affecting the right hip. She denies any numbness or tingling at today's visit. She denies any personal history of heart attack, stroke, asthma or COPD. No personal history of blood clots.  Past Medical History: Past Medical History:  Diagnosis Date  . Contusion, knee fell 05/15/2003  . Degenerative arthritis of hip  right hip  . Elevated cholesterol  . Foreign body of finger splinter removed 03/25/2002  . History of chickenpox  . Hypertension  . 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 Shoulder injury [place] 05/15/2003  Impingement type symptomatology right shoulder with possible partial cuff tear.  Marland Kitchen Spinal stenosis  . Trochanteric bursitis of right hip   Past Surgical History: Past Surgical History:  Procedure Laterality Date  . CHOLECYSTECTOMY 1977  . D&C  . Removal of foreign body 03/25/2002  splinter, finger  . Right shoulder rotator cuff surgery 2011  . Shoulder injection 09/22/2003  . Trochanteric hip injection 04/10/2014 by Dr. Rudene Christians   Past Family History: Family History  Problem Relation Age of Onset  . Diabetes Sister  . Liver cancer Other  . Heart disease Mother   Medications: Current Outpatient Medications Ordered in Epic  Medication Sig Dispense Refill  . acetaminophen (TYLENOL) 500 MG tablet Take 2 tablets by mouth as needed  . amLODIPine (NORVASC) 5 MG tablet Take 5 mg by mouth once daily  . aspirin (ASPIRIN LOW DOSE) 81 MG EC tablet Take 81 mg by mouth once daily.  Marland Kitchen atorvastatin  (LIPITOR) 20 MG tablet Take 1 tablet by mouth once daily.  . cholecalciferol (VITAMIN D3) 1,000 unit capsule Take 1,000 Units by mouth once daily  . losartan-hydrochlorothiazide (HYZAAR) 100-12.5 mg tablet Take 1 tablet by mouth once daily.  . pantoprazole (PROTONIX) 40 MG DR tablet Take 40 mg by mouth once daily.  . traMADoL (ULTRAM) 50 mg tablet Take 0.5-1 tablets (25-50 mg total) by mouth every 6 (six) hours as needed for Pain 20 tablet 0   No current Epic-ordered facility-administered medications on file.   Allergies: Allergies  Allergen Reactions  . Aleve [Naproxen Sodium] Other (See Comments)  GI intolerance  . Clindamycin Itching and Rash  . Lincomycin Itching and Rash    Review of Systems:  A comprehensive 14 point ROS was performed, reviewed by me today, and the pertinent orthopaedic findings are documented in the HPI.  Exam: BP 152/84  Ht 167.6 cm (5\' 6" )  Wt 83.9 kg (185 lb)  BMI 29.86 kg/m  General/Constitutional: The patient appears to be well-nourished, well-developed, and in no acute distress. Neuro/Psych: Normal mood and affect, oriented to person, place and time. Eyes: Non-icteric. Pupils are equal, round, and reactive to light, and exhibit synchronous movement. ENT: Unremarkable. Lymphatic: No palpable adenopathy. Respiratory: Lungs clear to auscultation, Normal chest excursion, No wheezes and Non-labored breathing Cardiovascular: Regular rate and rhythm. No murmurs. and No edema, swelling or tenderness, except as noted in detailed exam. Integumentary: No impressive skin lesions present, except as noted in detailed exam. Musculoskeletal: Unremarkable, except as noted  in detailed exam.  Lumbar Spine: Examination of the lumbar spine reveals no bony abnormality, no edema, and no ecchymosis. There is no step off. The patient has full range of motion of the lumbar spine with flexion and extension. The patient has normal lateral bend and rotation. The patient has no  pain with range of motion activities. The patient has a negative axial load test, and a negative rotational Waddell test. The patient is non tender along the spinous process. The patient is minimally tender along the paravertebral muscles of the lumbosacral junction, with no muscle spasms. The patient is non tender along the iliac crest. The patient is non tender in the sciatic notch. The patient is non tender along the Sacroiliac joint. There is no Coccyx joint tenderness.   Bilateral Lower Extremities: Examination of the lower extremities reveals no bony abnormality, no edema, and no ecchymosis. Patient has 10 degrees of right hip internal rotation reproducing lateral hip, buttocks and thigh pain down to the knee. Pain is severe with right hip internal rotation. She has normal left hip internal and external rotation. Normal range of motion of the left and right knee and ankle. The patient is mildly tender along the right greater trochanter region and nontender along the left greater troche. The patient has a negative Bevelyn Buckles' test bilaterally. There is normal skin warmth. There is normal capillary refill bilaterally.   Neurologic: The patient has a positive right straight leg raise. The patient has normal muscle strength testing for the quadriceps, calves, ankle dorsiflexion, ankle plantarflexion, and extensor hallicus longus. The patient has sensation that is intact to light touch. The deep tendon reflexes are normal at the patella and achilles. No clonus is noted.   Imaging: AP pelvis and lateral view of the right hip were reviewed today. Impression: Patient has severe degenerative changes of the right hip joint with complete loss of joint space in the central acetabulum and 90% loss of joint space in the superior acetabulum. There is significant sclerotic changes noted throughout the acetabulum with subchondral cyst formation noted along the femoral head. Large inferior and superior acetabular  spurring present. No evidence of pelvic fracture. Moderate degenerative changes of the left hip joint present.  Impression: Primary osteoarthritis of right hip [M16.11] Primary osteoarthritis of right hip (primary encounter diagnosis)  Plan:  1. Treatment options were discussed today with the patient. 2. The patient was instructed on the risk and benefits of a right total hip arthroplasty. The patient acknowledges the risk and wishes to proceed at this time. Surgery scheduled on 6/1 with Dr. Rudene Christians. 3. This document will serve as a surgical history and physical for the patient. 4. The patient will follow-up per standard post-op protocol. They can call the clinic they have any questions, new symptoms develop or symptoms worsen.  The procedure was discussed with the patient, as were the potential risks (including bleeding, infection, nerve and/or blood vessel injury, persistent or recurrent pain, failure of the hardware, dislocation, leg-length inequality, need for further surgery, blood clots, strokes, heart attacks and/or arhythmias, pneumonia, etc.) and benefits. The patient states her understanding and wishes to proceed.  This office visit took 30 minutes, of which >50% involved patient counseling/education.  Review of the  CSRS was performed in accordance of the [place] prior to dispensing any controlled drugs.  This note was generated in part with voice recognition software and I apologize for any typographical errors that were not detected and corrected.  Raquel James, PA-C [facility]  Electronically signed by Lattie Corns, PA at 05/06/2020 11:04 PM EDT  Reviewed paper H+P, will be scanned into chart. No changes noted.

## 2020-05-11 NOTE — Op Note (Signed)
05/11/2020  12:14 PM  PATIENT:  Natasha [name]  74 y.o. female  PRE-OPERATIVE DIAGNOSIS:  Primary osteoarthritis of right hip  POST-OPERATIVE DIAGNOSIS:  Primary osteoarthritis of right hip  PROCEDURE:  Procedure(s): RIGHT TOTAL HIP ARTHROPLASTY ANTERIOR APPROACH (Right)  SURGEON: Laurene Footman, MD  ASSISTANTS: none  ANESTHESIA:   spinal  EBL:  Total I/O In: 100 [IV Piggyback:100] Out: 350 [Urine:200; Blood:150]  BLOOD ADMINISTERED:none  DRAINS: Incisional wound VAC   LOCAL MEDICATIONS USED:  MARCAINE    and OTHER Exparel  SPECIMEN:  Source of Specimen:  Right femoral head  DISPOSITION OF SPECIMEN:  PATHOLOGY  COUNTS:  YES  TOURNIQUET:  * No tourniquets in log *  IMPLANTS: Medacta AMIS 4 standard stem, 50 mm Mpact TM cup and liner, L metal 28 mm head  DICTATION: .Dragon Dictation   The patient was brought to the operating room and after spinal anesthesia was obtained patient was placed on the operative table with the ipsilateral foot into the Medacta attachment, contralateral leg on a well-padded table. C-arm was brought in and preop template x-ray taken. After prepping and draping in usual sterile fashion appropriate patient identification and timeout procedures were completed. Anterior approach to the hip was obtained and centered over the greater trochanter and TFL muscle. The subcutaneous tissue was incised hemostasis being achieved by electrocautery. TFL fascia was incised and the muscle retracted laterally deep retractor placed. The lateral femoral circumflex vessels were identified and ligated. The anterior capsule was exposed and a capsulotomy performed. The neck was identified and a femoral neck cut carried out with a saw. The head was removed without difficulty and showed sclerotic femoral head and acetabulum. Reaming was carried out to 50 mm and a 50 mm cup trial gave appropriate tightness to the acetabular component a 50 DM cup was impacted into position. The leg  was then externally rotated and ischiofemoral and pubofemoral releases carried out. The femur was sequentially broached to a size 4, size 4 standard with S head trials were placed and the final components chosen. The 4 standard stem was inserted along with a L metal 28 mm head and 50 mm liner. The hip was reduced and was stable the wound was thoroughly irrigated with fibrillar placed along the posterior capsule and medial neck. The deep fascia ws closed using a heavy Quill after infiltration of 30 cc of quarter percent Sensorcaine with epinephrine. and Exparel, 3-0 V-loc to close the skin with skin staples.  Incisional wound VAC applied and patient was sent to recovery in stable condition.   PLAN OF CARE: Admit to inpatient

## 2020-05-11 NOTE — Transfer of Care (Signed)
Immediate Anesthesia Transfer of Care Note  Patient: Natasha [name]  Procedure(s) Performed: RIGHT TOTAL HIP ARTHROPLASTY ANTERIOR APPROACH (Right Hip)  Patient Location: PACU  Anesthesia Type:Spinal  Level of Consciousness: awake and alert   Airway & Oxygen Therapy: Patient connected to face mask oxygen  Post-op Assessment: Report given to RN  Post vital signs: stable  Last Vitals:  Vitals Value Taken Time  BP 123/56 05/11/20 1210  Temp 35.8 C 05/11/20 1210  Pulse 89 05/11/20 1212  Resp 14 05/11/20 1212  SpO2 100 % 05/11/20 1212  Vitals shown include unvalidated device data.  Last Pain:  Vitals:   05/11/20 0823  TempSrc: Tympanic  PainSc: 7          Complications: No apparent anesthesia complications

## 2020-05-12 DIAGNOSIS — Z8249 Family history of ischemic heart disease and other diseases of the circulatory system: Secondary | ICD-10-CM | POA: Diagnosis not present

## 2020-05-12 DIAGNOSIS — I2510 Atherosclerotic heart disease of native coronary artery without angina pectoris: Secondary | ICD-10-CM | POA: Diagnosis present

## 2020-05-12 DIAGNOSIS — E7800 Pure hypercholesterolemia, unspecified: Secondary | ICD-10-CM | POA: Diagnosis present

## 2020-05-12 DIAGNOSIS — M1611 Unilateral primary osteoarthritis, right hip: Secondary | ICD-10-CM | POA: Diagnosis present

## 2020-05-12 DIAGNOSIS — M8580 Other specified disorders of bone density and structure, unspecified site: Secondary | ICD-10-CM | POA: Diagnosis present

## 2020-05-12 DIAGNOSIS — E785 Hyperlipidemia, unspecified: Secondary | ICD-10-CM | POA: Diagnosis present

## 2020-05-12 DIAGNOSIS — Z833 Family history of diabetes mellitus: Secondary | ICD-10-CM | POA: Diagnosis not present

## 2020-05-12 DIAGNOSIS — Z7982 Long term (current) use of aspirin: Secondary | ICD-10-CM | POA: Diagnosis not present

## 2020-05-12 DIAGNOSIS — Z9049 Acquired absence of other specified parts of digestive tract: Secondary | ICD-10-CM | POA: Diagnosis not present

## 2020-05-12 DIAGNOSIS — Z79899 Other long term (current) drug therapy: Secondary | ICD-10-CM | POA: Diagnosis not present

## 2020-05-12 DIAGNOSIS — Z85828 Personal history of other malignant neoplasm of skin: Secondary | ICD-10-CM | POA: Diagnosis not present

## 2020-05-12 DIAGNOSIS — K219 Gastro-esophageal reflux disease without esophagitis: Secondary | ICD-10-CM | POA: Diagnosis present

## 2020-05-12 DIAGNOSIS — Z800 Family history of malignant neoplasm of digestive organs: Secondary | ICD-10-CM | POA: Diagnosis not present

## 2020-05-12 DIAGNOSIS — I10 Essential (primary) hypertension: Secondary | ICD-10-CM | POA: Diagnosis present

## 2020-05-12 LAB — BASIC METABOLIC PANEL
Anion gap: 8 (ref 5–15)
BUN: 12 mg/dL (ref 8–23)
CO2: 27 mmol/L (ref 22–32)
Calcium: 9.1 mg/dL (ref 8.9–10.3)
Chloride: 104 mmol/L (ref 98–111)
Creatinine, Ser: 0.85 mg/dL (ref 0.44–1.00)
GFR calc Af Amer: >60 mL/min (ref >60)
GFR calc non Af Amer: >60 mL/min (ref >60)
Glucose, Bld: 94 mg/dL (ref 70–99)
Potassium: 3.4 mmol/L — ABNORMAL LOW (ref 3.5–5.1)
Sodium: 139 mmol/L (ref 135–145)

## 2020-05-12 LAB — CBC
HCT: 32 % — ABNORMAL LOW (ref 36.0–46.0)
Hemoglobin: 10.7 g/dL — ABNORMAL LOW (ref 12.0–15.0)
MCH: 29.9 pg (ref 26.0–34.0)
MCHC: 33.4 g/dL (ref 30.0–36.0)
MCV: 89.4 fL (ref 80.0–100.0)
Platelets: 123 10*3/uL — ABNORMAL LOW (ref 150–400)
RBC: 3.58 MIL/uL — ABNORMAL LOW (ref 3.87–5.11)
RDW: 12.2 % (ref 11.5–15.5)
WBC: 4.7 10*3/uL (ref 4.0–10.5)
nRBC: 0 % (ref 0.0–0.2)

## 2020-05-12 MED ORDER — EPHEDRINE 5 MG/ML INJ
INTRAVENOUS | Status: AC
  Filled 2020-05-12: qty 10

## 2020-05-12 MED ORDER — POTASSIUM CHLORIDE 20 MEQ PO PACK
20.0000 meq | PACK | Freq: Two times a day (BID) | ORAL | Status: AC
  Administered 2020-05-12 (×2): 20 meq via ORAL
  Filled 2020-05-12 (×2): qty 1

## 2020-05-12 NOTE — Progress Notes (Signed)
Physical Therapy Treatment Patient Details Name: Natasha [name] MRN: [id number] DOB: May 02, 1946 Today's Date: 05/12/2020    History of Present Illness Pt is a 74 yo female diagnosed with primary osteoarthritis of right hip and is s/p elective R THA.  PMH includes: HTN, GERD, spinal stenosis, and R hip bursitis.    PT Comments    Pt pleasant and motivated to participate during the session.  Pt required no physical assistance with functional tasks and was able to increase amb distance this session.  Pt reported that from an effort perspective walking 80 feet was "easy" with further distances self-limited by pain.  Pt reported no adverse symptoms other than pain during the session with SpO2 remaining in the upper 90s and HR WNL throughout.  Will attempt stair training tomorrow as appropriate.  Pt will benefit from HHPT services upon discharge to safely address deficits listed in patient problem list for decreased caregiver assistance and eventual return to PLOF.      Follow Up Recommendations  Home health PT;Supervision - Intermittent     Equipment Recommendations  Rolling walker with 5" wheels;3in1 (PT)    Recommendations for Other Services       Precautions / Restrictions Precautions Precautions: Fall;Anterior Hip Precaution Booklet Issued: Yes (comment) Restrictions Weight Bearing Restrictions: Yes RLE Weight Bearing: Weight bearing as tolerated    Mobility  Bed Mobility Overal bed mobility: Needs Assistance Bed Mobility: Supine to Sit;Sit to Supine     Supine to sit: Supervision;HOB elevated     General bed mobility comments: Extra time and effort but no physical assistance required  Transfers Overall transfer level: Needs assistance Equipment used: Rolling walker (2 wheeled) Transfers: Sit to/from Stand Sit to Stand: Min guard         General transfer comment: Mod verbal cues for hand placement and increased trunk flexion especially during stand to sit with  practice from multiple height surfaces  Ambulation/Gait Ambulation/Gait assistance: Min guard Gait Distance (Feet): 80 Feet x 1, 70 Feet x 1 Assistive device: Rolling walker (2 wheeled) Gait Pattern/deviations: Step-to pattern;Trunk flexed;Decreased step length - left;Decreased stance time - right;Step-through pattern Gait velocity: decreased   General Gait Details: Step-to pattern that gradually improved to beginning step-through pattern, good stability throughout and with SpO2 and HR WNL   Stairs             Wheelchair Mobility    Modified Rankin (Stroke Patients Only)       Balance Overall balance assessment: Needs assistance Sitting-balance support: Feet unsupported;No upper extremity supported Sitting balance-Leahy Scale: Normal     Standing balance support: Bilateral upper extremity supported;During functional activity Standing balance-Leahy Scale: Good                              Cognition Arousal/Alertness: Awake/alert Behavior During Therapy: WFL for tasks assessed/performed Overall Cognitive Status: Within Functional Limits for tasks assessed                                        Exercises Total Joint Exercises Ankle Circles/Pumps: AROM;Both;10 reps Quad Sets: Strengthening;Both;10 reps Gluteal Sets: Strengthening;Both;10 reps Long Arc Quad: AROM;Strengthening;Both;10 reps Knee Flexion: AROM;Strengthening;Both;10 reps Other Exercises Other Exercises: Anterior hip precaution review with pt and family Other Exercises: Car transfer sequencing education verbally and with chair simulation with pt and daughter who will be taking  pt home    General Comments        Pertinent Vitals/Pain Pain Assessment: 0-10 Pain Score: 6  Pain Location: R hip Pain Descriptors / Indicators: Aching;Sore Pain Intervention(s): Premedicated before session;Monitored during session    Home Living                      Prior Function             PT Goals (current goals can now be found in the care plan section) Progress towards PT goals: Progressing toward goals    Frequency    BID      PT Plan Current plan remains appropriate    Co-evaluation              AM-PAC PT "6 Clicks" Mobility   Outcome Measure  Help needed turning from your back to your side while in a flat bed without using bedrails?: A Little Help needed moving from lying on your back to sitting on the side of a flat bed without using bedrails?: A Little Help needed moving to and from a bed to a chair (including a wheelchair)?: A Little Help needed standing up from a chair using your arms (e.g., wheelchair or bedside chair)?: A Little Help needed to walk in hospital room?: A Little Help needed climbing 3-5 steps with a railing? : A Little 6 Click Score: 18    End of Session Equipment Utilized During Treatment: Gait belt Activity Tolerance: Patient tolerated treatment well Patient left: in chair;with call bell/phone within reach;with SCD's reapplied;with chair alarm set;with family/visitor present Nurse Communication: Mobility status PT Visit Diagnosis: Muscle weakness (generalized) (M62.81);Other abnormalities of gait and mobility (R26.89);Pain Pain - Right/Left: Right Pain - part of body: Hip     Time: [id number] PT Time Calculation (min) (ACUTE ONLY): 38 min  Charges:  $Gait Training: 8-22 mins $Therapeutic Exercise: 8-22 mins $Therapeutic Activity: 8-22 mins                     D. Scott Nelson Noone PT, DPT 05/12/20, 4:23 PM

## 2020-05-12 NOTE — TOC Initial Note (Signed)
Transition of Care [facility]) - Initial/Assessment Note    Patient Details  Name: Natasha [name] MRN: [id number] Date of Birth: 12/26/1945  Transition of Care [facility]) CM/SW Contact:    Elease Hashimoto, LCSW Phone Number: 05/12/2020, 9:18 AM  Clinical Narrative:   Met with pt and daughter who is at the bedside, she reports all four daughters will be rotating to assist her at discharge. She was independent prior to admission but did use a rollator rolling walker for her balance issues. She drives and husband also drives to their appointments.  She has a elevated commode so feels it is not needed. She is aware of [facility] to follow at [place] for home health. Will work with on discharge plans.               Expected Discharge Plan: [place] Barriers to Discharge: Continued Medical Work up   Patient Goals and CMS Choice Patient states their goals for this hospitalization and ongoing recovery are:: I plan to go home with help from my daughter's. I have four of them CMS Medicare.gov Compare Post Acute Care list provided to:: Patient Choice offered to / list presented to : Patient  Expected Discharge Plan and Services Expected Discharge Plan: [place] In-house Referral: Clinical Social Work   Post Acute Care Choice: Durable Medical Equipment, Home Health Living arrangements for the past 2 months: [place]                 DME Arranged: Walker rolling DME Agency: [employer] Date DME Agency Contacted: 05/12/20 Time DME Agency Contacted: [id number] Representative spoke with at DME Agency: [place]: PT [place]: [facility] ([facility]) Date [place]: 05/12/20 Time [place]: [phone] Representative spoke with at [place]: [place]  Prior Living Arrangements/Services Living arrangements for the past 2 months: [place] with:: Spouse Patient language and need for interpreter reviewed:: No Do you  feel safe going back to the place where you live?: Yes      Need for Family Participation in Patient Care: Yes (Comment) Care giver support system in place?: Yes (comment) Current home services: DME(has rollator and elevated commode) Criminal Activity/Legal Involvement Pertinent to Current Situation/Hospitalization: No - Comment as needed  Activities of Daily Living Home Assistive Devices/Equipment: Bedside commode/3-in-1 ADL Screening (condition at time of admission) Patient's cognitive ability adequate to safely complete daily activities?: Yes Is the patient deaf or have difficulty hearing?: No Does the patient have difficulty seeing, even when wearing glasses/contacts?: No Does the patient have difficulty concentrating, remembering, or making decisions?: No Patient able to express need for assistance with ADLs?: Yes Does the patient have difficulty dressing or bathing?: No Independently performs ADLs?: Yes (appropriate for developmental age) Does the patient have difficulty walking or climbing stairs?: Yes Weakness of Legs: Both Weakness of Arms/Hands: None  Permission Sought/Granted Permission sought to share information with : Facility Sport and exercise psychologist, Family Supports Permission granted to share information with : Yes, Verbal Permission Granted  Share Information with NAME: larry  Permission granted to share info w AGENCY: [facility]  Permission granted to share info w Relationship: husband  Permission granted to share info w Contact Information: [place]  Emotional Assessment Appearance:: Appears stated age Attitude/Demeanor/Rapport: Engaged Affect (typically observed): Adaptable, Accepting Orientation: : Oriented to Self, Oriented to Place, Oriented to  Time, Oriented to Situation Alcohol / Substance Use: Never Used Psych Involvement: No (comment)  Admission diagnosis:  Status post total hip replacement, right [Z96.641] Patient Active Problem List   Diagnosis Date Noted   . Status post total hip replacement, right 05/11/2020  . Headache syndrome 04/30/2017  . Facial pain 04/11/2017  . Temple tenderness 04/11/2017  . Routine general medical examination at a health care facility 11/22/2015  . Colon cancer screening 11/22/2015  . Special screening for malignant neoplasms, colon 11/17/2014  . Estrogen deficiency 11/17/2014  . Encounter for Medicare annual wellness exam 10/28/2013  . Prediabetes 10/28/2013  . Obesity 10/28/2013  . Post-menopausal 09/24/2012  . Allergic rhinitis 04/26/2012  . Gynecologic exam normal 04/19/2011  . Osteopenia 11/10/2008  . THROMBOCYTOPENIA 08/11/2008  . POSTMENOPAUSAL STATUS 07/09/2008  . HYPERCHOLESTEROLEMIA 07/16/2007  . Essential hypertension 07/16/2007  . CORONARY ARTERY DISEASE 07/16/2007  . Congestive heart failure ([place]) 07/16/2007  . [place] DISEASE, LUMBAR SPINE 07/16/2007   PCP:  Abner Greenspan, MD Pharmacy:   [place] [place] [address] [place] [address] Phone: [phone] Fax: [phone]  [place], [place] [address] [place] [address] Phone: [phone] Fax: [phone]     Social Determinants of Health (SDOH) Interventions    Readmission Risk Interventions No flowsheet data found.

## 2020-05-12 NOTE — Progress Notes (Signed)
   Subjective: 1 Day Post-Op Procedure(s) (LRB): RIGHT TOTAL HIP ARTHROPLASTY ANTERIOR APPROACH (Right) Patient reports pain as mild.   Patient is well, and has had no acute complaints or problems Denies any CP, SOB, ABD pain. We will continue therapy today.  Plan is to go Home after hospital stay.  Objective: Vital signs in last 24 hours: Temp:  [96.5 F (35.8 C)-98.6 F (37 C)] 98.6 F (37 C) (06/02 0726) Pulse Rate:  [2-88] 74 (06/02 0726) Resp:  [10-25] 17 (06/02 0726) BP: (109-151)/(50-87) 129/52 (06/02 0726) SpO2:  [93 %-100 %] 93 % (06/02 0726) Weight:  [81.6 kg] 81.6 kg (06/01 0823)  Intake/Output from previous day: 06/01 0701 - 06/02 0700 In: 1100 [I.V.:1000; IV Piggyback:100] Out: 1950 [Urine:1800; Blood:150] Intake/Output this shift: No intake/output data recorded.  Recent Labs    05/11/20 1328 05/12/20 0422  HGB 13.2 10.7*   Recent Labs    05/11/20 1328 05/12/20 0422  WBC 5.7 4.7  RBC 4.31 3.58*  HCT 37.8 32.0*  PLT 133* 123*   Recent Labs    05/11/20 1328 05/12/20 0422  NA  --  139  K  --  3.4*  CL  --  104  CO2  --  27  BUN  --  12  CREATININE 0.86 0.85  GLUCOSE  --  94  CALCIUM  --  9.1   No results for input(s): LABPT, INR in the last 72 hours.  EXAM General - Patient is Alert, Appropriate and Oriented Extremity - Neurovascular intact Sensation intact distally Intact pulses distally Dorsiflexion/Plantar flexion intact No cellulitis present Compartment soft Dressing - dressing C/D/I and no drainage, Praveena intact without drainage Motor Function - intact, moving foot and toes well on exam.   Past Medical History:  Diagnosis Date  . CAD (coronary artery disease)    (4/05 carotid dopler- 39% stenosis) , (ECHO- mild LVH, EF 60%)  . Cancer ([place]) 2010   skin cancer right cheek  . CHF (congestive heart failure) ([place])    patient unaware of this diagnosis. may have been over 30 years ago.  . DDD (degenerative disc disease), lumbar  11/2006   L5-S1 bulging disk  . DDD (degenerative disc disease), lumbar   . GERD (gastroesophageal reflux disease)   . Headache syndrome 04/30/2017   Left occipital and ear   . Hyperlipidemia   . Hypertension   . Osteopenia   . Spinal stenosis   . Thrombocytopenia ([facility])    mild    Assessment/Plan:   1 Day Post-Op Procedure(s) (LRB): RIGHT TOTAL HIP ARTHROPLASTY ANTERIOR APPROACH (Right) Active Problems:   Status post total hip replacement, right  Estimated body mass index is 29.94 kg/m as calculated from the following:   Height as of this encounter: 5\' 5"  (1.651 m).   Weight as of this encounter: 81.6 kg. Advance diet Up with therapy  Needs bowel movement Labs and vital signs stable Care management to assist with discharge.  DVT Prophylaxis - Lovenox, TED hose and SCDs Weight-Bearing as tolerated to right leg   T. Rachelle Hora, PA-C [place] 05/12/2020, 7:53 AM

## 2020-05-12 NOTE — Progress Notes (Signed)
Physical Therapy Treatment Patient Details Name: Natasha [name] MRN: [id number] DOB: [birth year] Today's Date: 05/12/2020    History of Present Illness Pt is a 74 yo female diagnosed with primary osteoarthritis of right hip and is s/p elective R THA.  PMH includes: HTN, GERD, spinal stenosis, and R hip bursitis.    PT Comments    Pt pleasant and motivated to participate during the session.  Pt did not require physical assistance with sit to/from stand transfers but during first stand to sit the pt leaned heavily posteriorly requiring her BUEs to control her descent.  Pt given verbal and visual cues for proper sequencing along with practice from various height surfaces with pt demonstrating good carryover.  Pt was steady with amb with a step-to pattern and slow cadence.  Pt given cues to attempt to progress towards step-through pattern but was unable to do so.  Pt will benefit from HHPT services upon discharge to safely address deficits listed in patient problem list for decreased caregiver assistance and eventual return to PLOF.     Follow Up Recommendations  Home health PT;Supervision - Intermittent     Equipment Recommendations  Rolling walker with 5" wheels    Recommendations for Other Services       Precautions / Restrictions Precautions Precautions: Fall;Anterior Hip Restrictions Weight Bearing Restrictions: Yes RLE Weight Bearing: Weight bearing as tolerated    Mobility  Bed Mobility Overal bed mobility: Needs Assistance Bed Mobility: Supine to Sit;Sit to Supine     Supine to sit: Supervision;HOB elevated     General bed mobility comments: NT, pt in recliner  Transfers Overall transfer level: Needs assistance Equipment used: Rolling walker (2 wheeled) Transfers: Sit to/from Stand Sit to Stand: Min guard Stand pivot transfers: Min guard       General transfer comment: Mod verbal cues for hand placement and increased trunk flexion especially during stand to sit  with practice from multiple height surfaces  Ambulation/Gait Ambulation/Gait assistance: Min guard Gait Distance (Feet): 25 Feet x 2 Assistive device: Rolling walker (2 wheeled) Gait Pattern/deviations: Step-to pattern;Trunk flexed;Decreased step length - left;Decreased stance time - right Gait velocity: decreased   General Gait Details: Step-to pattern antalgic on the R hip with good stability throughout and with SpO2 and HR WNL   Stairs             Wheelchair Mobility    Modified Rankin (Stroke Patients Only)       Balance Overall balance assessment: Needs assistance Sitting-balance support: Feet unsupported;No upper extremity supported Sitting balance-Leahy Scale: Good Sitting balance - Comments: Steady static sitting, reaching within BOS. Pain limited in sitting.   Standing balance support: Bilateral upper extremity supported;During functional activity Standing balance-Leahy Scale: Good                              Cognition Arousal/Alertness: Awake/alert Behavior During Therapy: WFL for tasks assessed/performed Overall Cognitive Status: Within Functional Limits for tasks assessed                                        Exercises Total Joint Exercises Ankle Circles/Pumps: AROM;Both;10 reps Quad Sets: Strengthening;Both;10 reps Gluteal Sets: Strengthening;Both;10 reps Long Arc Quad: AROM;Strengthening;Both;10 reps Knee Flexion: AROM;Strengthening;Both;10 reps Marching in Standing: AROM;Strengthening;Both;Standing;10 reps Other Exercises Other Exercises: HEP education/review per handout    General Comments  Pertinent Vitals/Pain Pain Assessment: 0-10 Pain Score: 7  Pain Location: R hip Pain Descriptors / Indicators: Aching;Sore Pain Intervention(s): Premedicated before session;Monitored during session    [place] expects to be discharged to:: Private residence Living Arrangements:  Spouse/significant other Available Help at Discharge: Family;Friend(s);Available 24 hours/day Type of Home: House Home Access: Stairs to enter Entrance Stairs-Rails: None Home Layout: One level Home Equipment: Walker - 4 wheels Additional Comments: Elevated toilet has armrests; pt has 4 daughters that will rotate providing 24/7 supervision    Prior Function Level of Independence: Independent      Comments: Ind amb community distances without an AD, no fall history, Ind with ADLs   PT Goals (current goals can now be found in the care plan section) Acute Rehab PT Goals Patient Stated Goal: To move and bend better without pain Progress towards PT goals: Progressing toward goals    Frequency    BID      PT Plan Current plan remains appropriate    Co-evaluation              AM-PAC PT "6 Clicks" Mobility   Outcome Measure  Help needed turning from your back to your side while in a flat bed without using bedrails?: A Little Help needed moving from lying on your back to sitting on the side of a flat bed without using bedrails?: A Little Help needed moving to and from a bed to a chair (including a wheelchair)?: A Little Help needed standing up from a chair using your arms (e.g., wheelchair or bedside chair)?: A Little Help needed to walk in hospital room?: A Little Help needed climbing 3-5 steps with a railing? : A Lot 6 Click Score: 17    End of Session Equipment Utilized During Treatment: Gait belt Activity Tolerance: Patient tolerated treatment well Patient left: in chair;with call bell/phone within reach;with SCD's reapplied;with chair alarm set;with family/visitor present Nurse Communication: Mobility status PT Visit Diagnosis: Muscle weakness (generalized) (M62.81);Other abnormalities of gait and mobility (R26.89);Pain Pain - Right/Left: Right Pain - part of body: Hip     Time: [id number] PT Time Calculation (min) (ACUTE ONLY): 26 min  Charges:  $Gait  Training: 8-22 mins $Therapeutic Exercise: 8-22 mins                     D. Scott Daesha [place]o PT, DPT 05/12/20, 10:29 AM

## 2020-05-12 NOTE — Anesthesia Postprocedure Evaluation (Signed)
Anesthesia Post Note  Patient: Natasha [name]  Procedure(s) Performed: RIGHT TOTAL HIP ARTHROPLASTY ANTERIOR APPROACH (Right Hip)  Patient location during evaluation: Nursing Unit Anesthesia Type: Spinal Level of consciousness: oriented and awake and alert Pain management: pain level controlled Vital Signs Assessment: post-procedure vital signs reviewed and stable Respiratory status: spontaneous breathing and respiratory function stable Cardiovascular status: blood pressure returned to baseline and stable Postop Assessment: no headache, no backache, no apparent nausea or vomiting and patient able to bend at knees Anesthetic complications: no     Last Vitals:  Vitals:   05/12/20 0403 05/12/20 0726  BP: (!) 122/50 (!) 129/52  Pulse: 64 74  Resp: 16 17  Temp: (!) 36.4 C 37 C  SpO2: 98% 93%    Last Pain:  Vitals:   05/12/20 0726  TempSrc: Oral  PainSc:                  Jerrye Noble

## 2020-05-12 NOTE — TOC Progression Note (Signed)
Transition of Care [facility]) - Progression Note    Patient Details  Name: Natasha [name] MRN: [id number] Date of Birth: 09-15-1946  Transition of Care [facility] CM/SW Contact  Jovonda Selner, Gardiner Rhyme, LCSW Phone Number: 05/12/2020, 3:34 PM  Clinical Narrative:  Have added 3 in 1 to the [employer] order she has decided she will need this at home. She reports her therapy has gone very well today, better than she thought it would. See in am to see if ready to go home.     Expected Discharge Plan: [place] Barriers to Discharge: Continued Medical Work up  Expected Discharge Plan and Services Expected Discharge Plan: [place] In-house Referral: Clinical Social Work   Post Acute Care Choice: Durable Medical Equipment, [place] arrangements for the past 2 months: Single Family Home                 DME Arranged: Walker rolling DME Agency: [employer] Date DME Agency Contacted: 05/12/20 Time DME Agency Contacted: [phone] Representative spoke with at DME Agency: [place]: PT [address]: [facility] ([facility]) Date [place]: 05/12/20 Time [place]: [phone] Representative spoke with at [place]: [place]   Social Determinants of Health ([place]) Interventions    Readmission Risk Interventions No flowsheet data found.

## 2020-05-12 NOTE — Evaluation (Signed)
Occupational Therapy Evaluation Patient Details Name: Natasha [name] MRN: [id number] DOB: May 31, 1946 Today's Date: 05/12/2020    History of Present Illness Pt is a 74 yo female diagnosed with primary osteoarthritis of right hip and is s/p elective R THA.  PMH includes: HTN, GERD, spinal stenosis, and R hip bursitis.   Clinical Impression   Natasha [name] was seen for OT evaluation this date, POD#1 from above surgery. Pt received with dtr present at bedside, spouse comes to room during session as well. Pt was independent in all ADLs prior to surgery, however occasionally using a 4WW for functional mobility due to R hip pain. Pt is eager to return to PLOF with less pain and improved safety and independence. Pt currently requires CGA for functional mobility and moderate assist for LB dressing and bathing while in seated position due to pain and limited AROM of R hip. Pt instructed in self care skills, falls prevention strategies, home/routines modifications, DME/AE for LB bathing and dressing tasks, and compression stocking mgt strategies. Pt would benefit from additional instruction in self care skills and techniques to help maintain precautions with or without assistive devices to support recall and carryover prior to discharge. Recommend HHOT upon discharge.      Follow Up Recommendations  Home health OT    Equipment Recommendations  3 in 1 bedside commode    Recommendations for Other Services       Precautions / Restrictions Precautions Precautions: Fall;Anterior Hip Restrictions Weight Bearing Restrictions: Yes RLE Weight Bearing: Weight bearing as tolerated      Mobility Bed Mobility Overal bed mobility: Needs Assistance Bed Mobility: Supine to Sit;Sit to Supine     Supine to sit: Supervision;HOB elevated     General bed mobility comments: Pt comes to sitting EOB w/o physical assist from therapist. HOB elevated, moderate increased time/effort to perform.  Transfers Overall  transfer level: Needs assistance Equipment used: Rolling walker (2 wheeled) Transfers: Sit to/from [facility] Sit to Stand: Min guard Stand pivot transfers: Min guard       General transfer comment: Pt requires verbal cues for hand/foot placement during STS. Stands from bed at lowest position. No adverse s/s noted t/o mobility attempts.    Balance Overall balance assessment: Needs assistance Sitting-balance support: Feet unsupported;No upper extremity supported Sitting balance-Leahy Scale: Good Sitting balance - Comments: Steady static sitting, reaching within BOS. Pain limited in sitting.   Standing balance support: Bilateral upper extremity supported;During functional activity Standing balance-Leahy Scale: Good                             ADL either performed or assessed with clinical judgement   ADL Overall ADL's : Needs assistance/impaired                                       General ADL Comments: Pt is functionally limited by increased pain and decreased AROM of her R hip. She requires Min guard for functional mobility with cueing for hand/foot placement. MOD A for LB ADL managment in seated position.  Set up/supervision for seated UB ADL management.     Vision Baseline Vision/History: Wears glasses Wears Glasses: At all times Patient Visual Report: No change from baseline       Perception     Praxis      Pertinent Vitals/Pain Pain Assessment: 0-10 Pain Score:  6  Pain Location: R hip Pain Descriptors / Indicators: Aching;Sore Pain Intervention(s): Limited activity within patient's tolerance;Monitored during session;Repositioned;RN gave pain meds during session     Hand Dominance Right   Extremity/Trunk Assessment Upper Extremity Assessment Upper Extremity Assessment: Overall WFL for tasks assessed   Lower Extremity Assessment Lower Extremity Assessment: RLE deficits/detail;Defer to PT evaluation RLE Deficits /  Details: s/p R TKA RLE Coordination: decreased gross motor   Cervical / Trunk Assessment Cervical / Trunk Assessment: Normal   Communication Communication Communication: No difficulties   Cognition Arousal/Alertness: Awake/alert Behavior During Therapy: WFL for tasks assessed/performed Overall Cognitive Status: Within Functional Limits for tasks assessed                                     General Comments       Exercises Other Exercises Other Exercises: Pt/caregivers educated on falls prevention strategies, safe use of AE/DME for ADL management, compression stocking management strategies, and safe transfer/bed mobility techniques. Would benefit from further review of AE for LB ADL management.   Shoulder Instructions      Home Living Family/patient expects to be discharged to:: Private residence Living Arrangements: Spouse/significant other Available Help at Discharge: Family;Friend(s);Available 24 hours/day Type of Home: House Home Access: Stairs to enter [employer] of Steps: 1+1 small threshold steps Entrance Stairs-Rails: None Home Layout: One level     Bathroom Shower/Tub: Tub/shower unit(With cut-out for step-in)   Bathroom Toilet: Handicapped height Bathroom Accessibility: Yes How Accessible: Accessible via walker Home Equipment: Walker - 4 wheels   Additional Comments: Elevated toilet has armrests; pt has 4 daughters that will rotate providing 24/7 supervision      Prior Functioning/Environment Level of Independence: Independent        Comments: Ind amb community distances without an AD, no fall history, Ind with ADLs        OT Problem List: Decreased strength;Decreased coordination;Pain;Decreased range of motion;Decreased activity tolerance;Decreased safety awareness;Impaired balance (sitting and/or standing);Decreased knowledge of use of DME or AE;Decreased knowledge of precautions      OT Treatment/Interventions:  Self-care/ADL training;Therapeutic exercise;Therapeutic activities;DME and/or AE instruction;Patient/family education;Balance training;Energy conservation    OT Goals(Current goals can be found in the care plan section) Acute Rehab OT Goals Patient Stated Goal: To move and bend better without pain OT Goal Formulation: With patient Time For Goal Achievement: 05/26/20 Potential to Achieve Goals: Good ADL Goals Pt Will Perform Lower Body Dressing: sit to/from stand;with modified independence(c LRAD PRN for improved safety and fxl independence.) Pt Will Transfer to Toilet: ambulating;bedside commode;regular height toilet;with modified independence(c LRAD PRN for improved safety and fxl independence.) Pt Will Perform Toileting - Clothing Manipulation and hygiene: sit to/from stand;with modified independence;with adaptive equipment(c LRAD PRN for improved safety and fxl independence.)  OT Frequency: Min 1X/week   Barriers to D/C:            Co-evaluation              AM-PAC OT "6 Clicks" Daily Activity     Outcome Measure Help from another person eating meals?: None Help from another person taking care of personal grooming?: A Little Help from another person toileting, which includes using toliet, bedpan, or urinal?: A Little Help from another person bathing (including washing, rinsing, drying)?: A Lot Help from another person to put on and taking off regular upper body clothing?: A Little Help from another person to put  on and taking off regular lower body clothing?: A Lot 6 Click Score: 17   End of Session Equipment Utilized During Treatment: Gait belt;Rolling walker  Activity Tolerance: Patient tolerated treatment well Patient left: in chair;with call bell/phone within reach;with chair alarm set;with family/visitor present;with SCD's reapplied  OT Visit Diagnosis: Other abnormalities of gait and mobility (R26.89);Pain Pain - Right/Left: Right Pain - part of body: Hip                 Time: [id number] OT Time Calculation (min): 45 min Charges:  OT General Charges $OT Visit: 1 Visit OT Evaluation $OT Eval Moderate Complexity: 1 Mod OT Treatments $Self Care/Home Management : 23-37 mins  Shara Blazing, M.S., OTR/L Ascom: [phone] 05/12/20, 9:58 AM

## 2020-05-13 LAB — CBC
HCT: 32.4 % — ABNORMAL LOW (ref 36.0–46.0)
Hemoglobin: 11.2 g/dL — ABNORMAL LOW (ref 12.0–15.0)
MCH: 30.4 pg (ref 26.0–34.0)
MCHC: 34.6 g/dL (ref 30.0–36.0)
MCV: 88 fL (ref 80.0–100.0)
Platelets: 115 10*3/uL — ABNORMAL LOW (ref 150–400)
RBC: 3.68 MIL/uL — ABNORMAL LOW (ref 3.87–5.11)
RDW: 12.2 % (ref 11.5–15.5)
WBC: 7.6 10*3/uL (ref 4.0–10.5)
nRBC: 0 % (ref 0.0–0.2)

## 2020-05-13 LAB — SURGICAL PATHOLOGY

## 2020-05-13 MED ORDER — HYDROCODONE-ACETAMINOPHEN 5-325 MG PO TABS: 1.0000 | ORAL_TABLET | Freq: Four times a day (QID) | ORAL | 0 refills | Status: DC | PRN

## 2020-05-13 MED ORDER — DOCUSATE SODIUM 100 MG PO CAPS: 100.0000 mg | ORAL_CAPSULE | Freq: Two times a day (BID) | ORAL | 0 refills | Status: DC

## 2020-05-13 MED ORDER — ENOXAPARIN SODIUM 40 MG/0.4ML ~~LOC~~ SOLN: 40.0000 mg | SUBCUTANEOUS | 0 refills | Status: DC

## 2020-05-13 MED ORDER — MAGNESIUM HYDROXIDE 400 MG/5ML PO SUSP
30.0000 mL | Freq: Once | ORAL | Status: AC
  Administered 2020-05-13: 30 mL via ORAL
  Filled 2020-05-13: qty 30

## 2020-05-13 MED ORDER — TRAMADOL HCL 50 MG PO TABS: 50.0000 mg | ORAL_TABLET | Freq: Four times a day (QID) | ORAL | 0 refills | Status: DC | PRN

## 2020-05-13 NOTE — Progress Notes (Signed)
Physical Therapy Treatment Patient Details Name: Natasha [name] MRN: [id number] DOB: 1946-08-26 Today's Date: 05/13/2020    History of Present Illness Pt is a 74 yo female diagnosed with primary osteoarthritis of right hip and is s/p elective R THA.  PMH includes: HTN, GERD, spinal stenosis, and R hip bursitis.    PT Comments    OOB to commode then completed 2 laps around small nursing pod with RW and min guard/supervison.  Steady gait with no safety.  Remained in recliner after session.  Goals met.  Follow Up Recommendations  Home health PT;Supervision - Intermittent     Equipment Recommendations  Rolling walker with 5" wheels;3in1 (PT)    Recommendations for Other Services       Precautions / Restrictions Precautions Precautions: Fall;Anterior Hip Precaution Booklet Issued: No Restrictions Weight Bearing Restrictions: Yes RLE Weight Bearing: Weight bearing as tolerated    Mobility  Bed Mobility Overal bed mobility: Needs Assistance Bed Mobility: Supine to Sit     Supine to sit: Supervision;HOB elevated     General bed mobility comments: Not observed, sitting in recliner  Transfers Overall transfer level: Needs assistance Equipment used: Rolling walker (2 wheeled) Transfers: Sit to/from Stand Sit to Stand: Supervision         General transfer comment: steady sitting and standing from recliner using RW without physical assist, 1-2 verbal cues from dtr for positioning  Ambulation/Gait Ambulation/Gait assistance: Min guard;Supervision Gait Distance (Feet): 180 Feet Assistive device: Rolling walker (2 wheeled) Gait Pattern/deviations: Step-to pattern;Trunk flexed;Decreased step length - left;Decreased stance time - right;Step-through pattern Gait velocity: decreased   General Gait Details: 2 laps around small pod with ease   Stairs             Wheelchair Mobility    Modified Rankin (Stroke Patients Only)       Balance Overall balance  assessment: Needs assistance Sitting-balance support: No upper extremity supported;Feet supported Sitting balance-Leahy Scale: Normal Sitting balance - Comments: steady static and dynamic sitting during UB and LB dressing, reaching slightly outside BOS   Standing balance support: During functional activity;No upper extremity supported Standing balance-Leahy Scale: Good Standing balance comment: maintained steady standing balance with both hands off walker during LB dressing, no LOB noted                            Cognition Arousal/Alertness: Awake/alert Behavior During Therapy: WFL for tasks assessed/performed Overall Cognitive Status: Within Functional Limits for tasks assessed                                        Exercises Other Exercises Other Exercises: Pt and caregivers educated on safe use of AE/DME for ADL participation, compression stocking mgt, showering routine, falls prevention strategies and home setup. Both pt and caregivers were receptive and engaged throughout; appreciative of handout provided for carryover.    General Comments        Pertinent Vitals/Pain Pain Assessment: Faces Pain Score: 6  Faces Pain Scale: Hurts a little bit Pain Location: R hip Pain Descriptors / Indicators: Aching;Sore Pain Intervention(s): Limited activity within patient's tolerance;Monitored during session;Repositioned    Home Living                      Prior Function            PT  Goals (current goals can now be found in the care plan section) Acute Rehab PT Goals Patient Stated Goal: To move and bend better without pain Progress towards PT goals: Progressing toward goals    Frequency    BID      PT Plan Current plan remains appropriate    Co-evaluation              AM-PAC PT "6 Clicks" Mobility   Outcome Measure  Help needed turning from your back to your side while in a flat bed without using bedrails?: None Help  needed moving from lying on your back to sitting on the side of a flat bed without using bedrails?: A Little Help needed moving to and from a bed to a chair (including a wheelchair)?: A Little Help needed standing up from a chair using your arms (e.g., wheelchair or bedside chair)?: A Little Help needed to walk in hospital room?: A Little Help needed climbing 3-5 steps with a railing? : A Little 6 Click Score: 19    End of Session Equipment Utilized During Treatment: Gait belt Activity Tolerance: Patient tolerated treatment well Patient left: in chair;with call bell/phone within reach;with chair alarm set;with family/visitor present Nurse Communication: Mobility status Pain - Right/Left: Right Pain - part of body: Hip     Time: 7121-9758 PT Time Calculation (min) (ACUTE ONLY): 13 min  Charges:  $Gait Training: 8-22 mins                    Chesley Noon, PTA 05/13/20, 3:39 PM

## 2020-05-13 NOTE — Progress Notes (Signed)
   Subjective: 2 Days Post-Op Procedure(s) (LRB): RIGHT TOTAL HIP ARTHROPLASTY ANTERIOR APPROACH (Right) Patient reports pain as mild.   Patient is well, and has had no acute complaints or problems Denies any CP, SOB, ABD pain. We will continue therapy today.  Plan is to go Home after hospital stay.  Objective: Vital signs in last 24 hours: Temp:  [97.5 F (36.4 C)-98.2 F (36.8 C)] 98.2 F (36.8 C) (06/02 2346) Pulse Rate:  [64-74] 74 (06/02 2346) Resp:  [16] 16 (06/02 2346) BP: (135-137)/(56-61) 136/56 (06/02 2346) SpO2:  [95 %-97 %] 95 % (06/02 2346)  Intake/Output from previous day: 06/02 0701 - 06/03 0700 In: 520 [P.O.:520] Out: -  Intake/Output this shift: No intake/output data recorded.  Recent Labs    05/11/20 1328 05/12/20 0422 05/13/20 0414  HGB 13.2 10.7* 11.2*   Recent Labs    05/12/20 0422 05/13/20 0414  WBC 4.7 7.6  RBC 3.58* 3.68*  HCT 32.0* 32.4*  PLT 123* 115*   Recent Labs    05/11/20 1328 05/12/20 0422  NA  --  139  K  --  3.4*  CL  --  104  CO2  --  27  BUN  --  12  CREATININE 0.86 0.85  GLUCOSE  --  94  CALCIUM  --  9.1   No results for input(s): LABPT, INR in the last 72 hours.  EXAM General - Patient is Alert, Appropriate and Oriented Extremity - Neurovascular intact Sensation intact distally Intact pulses distally Dorsiflexion/Plantar flexion intact No cellulitis present Compartment soft Dressing - dressing C/D/I and no drainage, Praveena intact without drainage Motor Function - intact, moving foot and toes well on exam.   Past Medical History:  Diagnosis Date  . CAD (coronary artery disease)    (4/05 carotid dopler- 39% stenosis) , (ECHO- mild LVH, EF 60%)  . Cancer ([place]) 2010   skin cancer right cheek  . CHF (congestive heart failure) ([place])    patient unaware of this diagnosis. may have been over 30 years ago.  . DDD (degenerative disc disease), lumbar 11/2006   L5-S1 bulging disk  . DDD (degenerative disc  disease), lumbar   . GERD (gastroesophageal reflux disease)   . Headache syndrome 04/30/2017   Left occipital and ear   . Hyperlipidemia   . Hypertension   . Osteopenia   . Spinal stenosis   . Thrombocytopenia ([facility])    mild    Assessment/Plan:   2 Days Post-Op Procedure(s) (LRB): RIGHT TOTAL HIP ARTHROPLASTY ANTERIOR APPROACH (Right) Active Problems:   Status post total hip replacement, right  Estimated body mass index is 29.94 kg/m as calculated from the following:   Height as of this encounter: 5\' 5"  (1.651 m).   Weight as of this encounter: 81.6 kg. Advance diet Up with therapy  Needs bowel movement Labs and vital signs stable Care management to assist with discharge to home with HHPT today or tomorrow pending on progress with PT  DVT Prophylaxis - Lovenox, TED hose and SCDs Weight-Bearing as tolerated to right leg   T. Rachelle Hora, PA-C [place] 05/13/2020, 8:56 AM

## 2020-05-13 NOTE — Progress Notes (Signed)
Occupational Therapy Treatment Patient Details Name: Natasha [name] MRN: [id number] DOB: 02/17/1946 Today's Date: 05/13/2020    History of present illness Pt is a 74 yo female diagnosed with primary osteoarthritis of right hip and is s/p elective R THA.  PMH includes: HTN, GERD, spinal stenosis, and R hip bursitis.   OT comments  Natasha [name] presents this morning awake in recliner following PT session, with husband, daughter and RN present. She was agreeable and motivated to participate in treatment throughout. Pt completed LB dressing with Supervision, using a reacher and RW. Cues for sequencing and education provided throughout. She completed UB dressing seated with Supervision. Overall, pt demonstrates steady static and dynamic sitting and standing balance, without only intermittent UE support during dressing. Pt, daughter and husband educated on safe use of AE/DME for ADL participation, compression stocking mgt, showering routine, falls prevention strategies and home setup. Both pt and caregivers were receptive and engaged throughout; appreciative of handout provided for carryover. Pt will continue to benefit from skilled acute OT services and HHOT upon discharge to maximize independence and safety and reduce caregiver burden.    Follow Up Recommendations  Home health OT    Equipment Recommendations  3 in 1 bedside commode    Recommendations for Other Services      Precautions / Restrictions Precautions Precautions: Fall;Anterior Hip Precaution Booklet Issued: No Restrictions Weight Bearing Restrictions: Yes RLE Weight Bearing: Weight bearing as tolerated       Mobility Bed Mobility               General bed mobility comments: Not observed, sitting in recliner  Transfers Overall transfer level: Needs assistance Equipment used: Rolling walker (2 wheeled) Transfers: Sit to/from Stand Sit to Stand: Supervision         General transfer comment: steady sitting and  standing from recliner using RW without physical assist, 1-2 verbal cues from dtr for positioning    Balance Overall balance assessment: Needs assistance Sitting-balance support: No upper extremity supported;Feet supported Sitting balance-Leahy Scale: Normal Sitting balance - Comments: steady static and dynamic sitting during UB and LB dressing, reaching slightly outside BOS   Standing balance support: During functional activity;No upper extremity supported Standing balance-Leahy Scale: Good Standing balance comment: maintained steady standing balance with both hands off walker during LB dressing, no LOB noted                           ADL either performed or assessed with clinical judgement   ADL Overall ADL's : Needs assistance/impaired                 Upper Body Dressing : Set up;Sitting Upper Body Dressing Details (indicate cue type and reason): donned and buttoned pajama top independently with Setup A, demonstrated good dynamic sitting balance Lower Body Dressing: Cueing for sequencing;Sit to/from stand;With adaptive equipment;Supervision/safety Lower Body Dressing Details (indicate cue type and reason): used reacher for threading BLE, completed sit <> stand t/f using RW, stood indep to adjust clothing; cues for sequencing education             Functional mobility during ADLs: Supervision/safety;Rolling walker General ADL Comments: Pt functionally independent in UB ADL while seated, with Setup A for gathering hygiene/clothing items. Supervision/Setup A for LB ADL, using RW for intermittent assist. Completes sit <> stand t/f with dtr providing 1 cue for sequencing.     Vision Baseline Vision/History: Wears glasses Wears Glasses: At all times Patient  Visual Report: No change from baseline     Perception     Praxis      Cognition Arousal/Alertness: Awake/alert Behavior During Therapy: WFL for tasks assessed/performed Overall Cognitive Status: Within  Functional Limits for tasks assessed                                          Exercises Other Exercises Other Exercises: Pt and caregivers educated on safe use of AE/DME for ADL participation, compression stocking mgt, showering routine, falls prevention strategies and home setup. Both pt and caregivers were receptive and engaged throughout; appreciative of handout provided for carryover.   Shoulder Instructions       General Comments      Pertinent Vitals/ Pain    Pain Score: 6  Pain Location: R hip Pain Descriptors / Indicators: Aching;Sore Pain Intervention(s): Limited activity within patient's tolerance;Monitored during session;RN gave pain meds during session;Repositioned;Ice applied  Home Living                                          Prior Functioning/Environment              Frequency  Min 1X/week        Progress Toward Goals  OT Goals(current goals can now be found in the care plan section)  Progress towards OT goals: Progressing toward goals  Acute Rehab OT Goals Patient Stated Goal: To move and bend better without pain OT Goal Formulation: With patient Time For Goal Achievement: 05/26/20 Potential to Achieve Goals: Good  Plan Discharge plan remains appropriate;Frequency remains appropriate    Co-evaluation                 AM-PAC OT "6 Clicks" Daily Activity     Outcome Measure   Help from another person eating meals?: None Help from another person taking care of personal grooming?: None Help from another person toileting, which includes using toliet, bedpan, or urinal?: A Little Help from another person bathing (including washing, rinsing, drying)?: A Lot Help from another person to put on and taking off regular upper body clothing?: None Help from another person to put on and taking off regular lower body clothing?: A Little 6 Click Score: 20    End of Session Equipment Utilized During Treatment:  Gait belt;Rolling walker  OT Visit Diagnosis: Other abnormalities of gait and mobility (R26.89);Pain Pain - Right/Left: Right Pain - part of body: Hip   Activity Tolerance     Patient Left in chair;with call bell/phone within reach;with chair alarm set;with family/visitor present;with SCD's reapplied   Nurse Communication          Time: [id number] OT Time Calculation (min): 27 min  Charges: OT General Charges $OT Visit: 1 Visit OT Treatments $Self Care/Home Management : 23-37 mins  Jerilynn Birkenhead, OTS 05/13/20, 12:26 PM

## 2020-05-13 NOTE — Discharge Instructions (Signed)

## 2020-05-13 NOTE — Progress Notes (Signed)
Physical Therapy Treatment Patient Details Name: Natasha [name] MRN: [id number] DOB: 10/15/1946 Today's Date: 05/13/2020    History of Present Illness Pt is a 74 yo female diagnosed with primary osteoarthritis of right hip and is s/p elective R THA.  PMH includes: HTN, GERD, spinal stenosis, and R hip bursitis.    PT Comments    Participated in exercises as described below.  Stood and was able to walk around small nursing pod and to rehab gym for stair training.  One short seated rest but no LOB or buckling.  Slow steady gait.  Stair training completed.  Pt and daughter educated on safety and expectations for recovery.  All questions answered.  Pt left in bathroom per her request and to call tech when finished.  Tech aware.  All goals met.   Follow Up Recommendations  Home health PT;Supervision - Intermittent     Equipment Recommendations  Rolling walker with 5" wheels;3in1 (PT)    Recommendations for Other Services       Precautions / Restrictions Precautions Precautions: Fall;Anterior Hip Restrictions Weight Bearing Restrictions: Yes RLE Weight Bearing: Weight bearing as tolerated    Mobility  Bed Mobility               General bed mobility comments: in recliner before and after  Transfers Overall transfer level: Needs assistance Equipment used: Rolling walker (2 wheeled) Transfers: Sit to/from Stand Sit to Stand: Supervision;Min guard            Ambulation/Gait Ambulation/Gait assistance: Min guard Gait Distance (Feet): 90 Feet Assistive device: Rolling walker (2 wheeled) Gait Pattern/deviations: Step-to pattern;Trunk flexed;Decreased step length - left;Decreased stance time - right;Step-through pattern Gait velocity: decreased   General Gait Details: 90' 50' with seated rest   Stairs Stairs: Yes Stairs assistance: Supervision;Min guard Stair Management: Two rails;Forwards Number of Stairs: 4 General stair comments: with ease.  has a small treshold  step into home, discussed technique and pt and daugther voiced understanding.   Wheelchair Mobility    Modified Rankin (Stroke Patients Only)       Balance Overall balance assessment: Needs assistance Sitting-balance support: Feet unsupported;No upper extremity supported Sitting balance-Leahy Scale: Normal     Standing balance support: Bilateral upper extremity supported;During functional activity Standing balance-Leahy Scale: Good                              Cognition Arousal/Alertness: Awake/alert Behavior During Therapy: WFL for tasks assessed/performed Overall Cognitive Status: Within Functional Limits for tasks assessed                                        Exercises Other Exercises Other Exercises: seated LAQ, marches, ankle pumps prior to gait    General Comments        Pertinent Vitals/Pain Pain Assessment: Faces Faces Pain Scale: Hurts a little bit Pain Location: R hip Pain Descriptors / Indicators: Aching;Sore Pain Intervention(s): Limited activity within patient's tolerance;Monitored during session;Repositioned    Home Living                      Prior Function            PT Goals (current goals can now be found in the care plan section) Progress towards PT goals: Progressing toward goals    Frequency      BID      PT Plan Current plan remains appropriate    Co-evaluation              AM-PAC PT "6 Clicks" Mobility   Outcome Measure  Help needed turning from your back to your side while in a flat bed without using bedrails?: A Little Help needed moving from lying on your back to sitting on the side of a flat bed without using bedrails?: A Little Help needed moving to and from a bed to a chair (including a wheelchair)?: A Little Help needed standing up from a chair using your arms (e.g., wheelchair or bedside chair)?: A Little Help needed to walk in hospital room?: A Little Help needed climbing  3-5 steps with a railing? : A Little 6 Click Score: 18    End of Session Equipment Utilized During Treatment: Gait belt Activity Tolerance: Patient tolerated treatment well Patient left: Other (comment);with call bell/phone within reach;with family/visitor present(in bathroom) Nurse Communication: Mobility status Pain - Right/Left: Right Pain - part of body: Hip     Time: 0857-0920 PT Time Calculation (min) (ACUTE ONLY): 23 min  Charges:  $Gait Training: 8-22 mins $Therapeutic Exercise: 8-22 mins                    Sarah Congdon, PTA 05/13/20, 9:29 AM  

## 2020-05-13 NOTE — Plan of Care (Signed)
  Problem: Education: Goal: Knowledge of General Education information will improve Description: Including pain rating scale, medication(s)/side effects and non-pharmacologic comfort measures 05/13/2020 1650 by Ferrel Logan, RN Outcome: Adequate for Discharge 05/13/2020 1115 by Ferrel Logan, RN Outcome: Progressing   Problem: Pain Managment: Goal: General experience of comfort will improve 05/13/2020 1650 by Ferrel Logan, RN Outcome: Adequate for Discharge 05/13/2020 1115 by Ferrel Logan, RN Outcome: Progressing   Problem: Safety: Goal: Ability to remain free from injury will improve 05/13/2020 1650 by Ferrel Logan, RN Outcome: Adequate for Discharge 05/13/2020 1115 by Ferrel Logan, RN Outcome: Progressing   Problem: Skin Integrity: Goal: Risk for impaired skin integrity will decrease 05/13/2020 1650 by Ferrel Logan, RN Outcome: Adequate for Discharge 05/13/2020 1115 by Ferrel Logan, RN Outcome: Progressing

## 2020-05-13 NOTE — TOC Transition Note (Signed)
Transition of Care [facility]) - CM/SW Discharge Note   Patient Details  Name: Natasha [name] MRN: [id number] Date of Birth: 1946-11-10  Transition of Care [facility]) CM/SW Contact:  Elease Hashimoto, LCSW Phone Number: 05/13/2020, 8:27 AM   Clinical Narrative:   Pt has done well here and is ready to DC home. She will have her four daughter's rotating to assist her. She is set up with [facility] for follow up PT and her equipment has been delivered via [employer]. Pt is set to Dc no further follow.    Final next level of care: [place] Barriers to Discharge: Barriers Resolved   Patient Goals and CMS Choice Patient states their goals for this hospitalization and ongoing recovery are:: I plan to go home with help from my daughter's. I have four of them CMS Medicare.gov Compare Post Acute Care list provided to:: Patient Choice offered to / list presented to : Patient  Discharge Placement                Patient to be transferred to facility by: Daughter via car Name of family member notified: Husband and daughter Patient and family notified of of transfer: 05/13/20  Discharge Plan and Services In-house Referral: Clinical Social Work   Post Acute Care Choice: Museum/gallery conservator, Home Health          DME Arranged: Gilford Rile rolling DME Agency: [employer] Date DME Agency Contacted: 05/12/20 Time DME Agency Contacted: [phone] Representative spoke with at DME Agency: [place]: PT [place]: [facility] (now [facility]) Date [place]: 05/12/20 Time [place]: [phone] Representative spoke with at [place]: [place]  Social Determinants of Health ([place]) Interventions     Readmission Risk Interventions No flowsheet data found.

## 2020-05-13 NOTE — Discharge Summary (Addendum)
Physician Discharge Summary  Patient ID: Natasha [name] MRN: [id number] DOB/AGE: [birth year] 74 y.o.  Admit date: 05/11/2020 Discharge date: 05/13/2020  Admission Diagnoses:  Status post total hip replacement, right [Z96.641]   Discharge Diagnoses: Patient Active Problem List   Diagnosis Date Noted  . Status post total hip replacement, right 05/11/2020  . Headache syndrome 04/30/2017  . Facial pain 04/11/2017  . Temple tenderness 04/11/2017  . Routine general medical examination at a health care facility 11/22/2015  . Colon cancer screening 11/22/2015  . Special screening for malignant neoplasms, colon 11/17/2014  . Estrogen deficiency 11/17/2014  . Encounter for Medicare annual wellness exam 10/28/2013  . Prediabetes 10/28/2013  . Obesity 10/28/2013  . Post-menopausal 09/24/2012  . Allergic rhinitis 04/26/2012  . Gynecologic exam normal 04/19/2011  . Osteopenia 11/10/2008  . THROMBOCYTOPENIA 08/11/2008  . POSTMENOPAUSAL STATUS 07/09/2008  . HYPERCHOLESTEROLEMIA 07/16/2007  . Essential hypertension 07/16/2007  . CORONARY ARTERY DISEASE 07/16/2007  . Congestive heart failure ([place]) 07/16/2007  . DEGENERATIVE DISC DISEASE, LUMBAR SPINE 07/16/2007    Past Medical History:  Diagnosis Date  . CAD (coronary artery disease)    (4/05 carotid dopler- 39% stenosis) , (ECHO- mild LVH, EF 60%)  . Cancer ([place]) 2010   skin cancer right cheek  . CHF (congestive heart failure) ([place])    patient unaware of this diagnosis. may have been over 30 years ago.  . DDD (degenerative disc disease), lumbar 11/2006   L5-S1 bulging disk  . DDD (degenerative disc disease), lumbar   . GERD (gastroesophageal reflux disease)   . Headache syndrome 04/30/2017   Left occipital and ear   . Hyperlipidemia   . Hypertension   . Osteopenia   . Spinal stenosis   . Thrombocytopenia ([place])    mild     Transfusion: none   Consultants (if any):   Discharged Condition: Improved  Hospital Course: NIKAELA DOM is an 74 y.o. female who was admitted 05/11/2020 with a diagnosis of right hip osteoarthritis  and went to the operating room on 05/11/2020 and underwent the above named procedures.    Surgeries: Procedure(s): RIGHT TOTAL HIP ARTHROPLASTY ANTERIOR APPROACH on 05/11/2020 Patient tolerated the surgery well. Taken to PACU where she was stabilized and then transferred to the orthopedic floor.  Started on Lovenox 40 mg q 24 hrs. Foot pumps applied bilaterally at 80 mm. Heels elevated on bed with rolled towels. No evidence of DVT. Negative [place]. Physical therapy started on day #1 for gait training and transfer. OT started day #1 for ADL and assisted devices.  Patient's foley was d/c on day #1. Patient's IV  was d/c on day #2.  On post op day #3 patient was stable and ready for discharge to home with HHPT.  Implants: Medacta AMIS 4 standard stem, 50 mm Mpact TM cup and liner, L metal 28 mm head  She was given perioperative antibiotics:  Anti-infectives (From admission, onward)   Start     Dose/Rate Route Frequency Ordered Stop   05/11/20 1700  ceFAZolin (ANCEF) IVPB 2g/100 mL premix     2 g 200 mL/hr over 30 Minutes Intravenous Every 6 hours 05/11/20 1214 05/11/20 2343   05/11/20 0813  ceFAZolin (ANCEF) 2-4 GM/100ML-% IVPB    Note to Pharmacy: Myles Lipps   : cabinet override      05/11/20 0813 05/11/20 1735   05/11/20 0600  ceFAZolin (ANCEF) IVPB 2g/100 mL premix     2 g 200 mL/hr over 30 Minutes Intravenous On  call to O.R. 05/11/20 [id number] 05/11/20 1736    .  She was given sequential compression devices, early ambulation, and Lovenox TEDs for DVT prophylaxis.  She benefited maximally from the hospital stay and there were no complications.    Recent vital signs:  Vitals:   05/12/20 2346 05/13/20 0857  BP: (!) 136/56 (!) 115/54  Pulse: 74 66  Resp: 16 18  Temp: 98.2 F (36.8 C) 98 F (36.7 C)  SpO2: 95% 96%    Recent laboratory studies:  Lab Results  Component Value Date    HGB 11.2 (L) 05/13/2020   HGB 10.7 (L) 05/12/2020   HGB 13.2 05/11/2020   Lab Results  Component Value Date   WBC 7.6 05/13/2020   PLT 115 (L) 05/13/2020   No results found for: INR Lab Results  Component Value Date   NA 139 05/12/2020   K 3.4 (L) 05/12/2020   CL 104 05/12/2020   CO2 27 05/12/2020   BUN 12 05/12/2020   CREATININE 0.85 05/12/2020   GLUCOSE 94 05/12/2020    Discharge Medications:   Allergies as of 05/13/2020      Reactions   Clindamycin/lincomycin Itching, Rash   Naproxen Sodium    stomach pain.  Irritated stomach horribly.      Medication List    STOP taking these medications   aspirin 81 MG tablet     TAKE these medications   acetaminophen 500 MG tablet Commonly known as: TYLENOL Take 1,000 mg by mouth every 6 (six) hours as needed for moderate pain or headache.   amLODipine 5 MG tablet Commonly known as: NORVASC Take 1 tablet (5 mg total) by mouth daily.   atorvastatin 20 MG tablet Commonly known as: LIPITOR Take 1 tablet (20 mg total) by mouth daily.   cetirizine 10 MG tablet Commonly known as: ZYRTEC Take 10 mg by mouth daily.   docusate sodium 100 MG capsule Commonly known as: COLACE Take 1 capsule (100 mg total) by mouth 2 (two) times daily.   enoxaparin 40 MG/0.4ML injection Commonly known as: LOVENOX Inject 0.4 mLs (40 mg total) into the skin daily for 14 days.   HYDROcodone-acetaminophen 5-325 MG tablet Commonly known as: NORCO/VICODIN Take 1 tablet by mouth every 6 (six) hours as needed for severe pain.   losartan-hydrochlorothiazide 100-12.5 MG tablet Commonly known as: HYZAAR TAKE 1/2 TABLET EVERY DAY What changed:   how much to take  how to take this  when to take this  additional instructions   pantoprazole 40 MG tablet Commonly known as: PROTONIX Take 1 tablet (40 mg total) by mouth daily.   traMADol 50 MG tablet Commonly known as: ULTRAM Take 50 mg by mouth every 6 (six) hours as needed. What changed:  Another medication with the same name was added. Make sure you understand how and when to take each.   traMADol 50 MG tablet Commonly known as: ULTRAM Take 1 tablet (50 mg total) by mouth every 6 (six) hours as needed. What changed: You were already taking a medication with the same name, and this prescription was added. Make sure you understand how and when to take each.   Vitamin D-3 25 MCG (1000 UT) Caps Take 2,000 Units by mouth daily.            Durable Medical Equipment  (From admission, onward)         Start     Ordered   05/11/20 1215  DME Walker rolling  Once  Question Answer Comment  Walker: With 5 Inch Wheels   Patient needs a walker to treat with the following condition Status post total hip replacement, right      05/11/20 1214   05/11/20 1215  DME 3 n 1  Once     05/11/20 1214   05/11/20 1215  DME Bedside commode  Once    Question:  Patient needs a bedside commode to treat with the following condition  Answer:  Status post total hip replacement, right   05/11/20 1214          Diagnostic Studies: DG HIP OPERATIVE UNILAT W OR W/O PELVIS RIGHT  Result Date: 05/11/2020 CLINICAL DATA:  74 year old female EXAM: OPERATIVE RIGHT HIP (WITH PELVIS IF PERFORMED) 1 VIEWS TECHNIQUE: Fluoroscopic spot image(s) were submitted for interpretation post-operatively. COMPARISON:  None. FLUOROSCOPY TIME:  0 minutes 12 seconds FINDINGS: Single intraoperative fluoroscopic AP spot view of the right hip. Partially visible bipolar arthroplasty hardware. AP alignment appears normal. No unexpected osseous changes identified. IMPRESSION: Intraoperative view of bipolar hip arthroplasty with no adverse features. Electronically Signed   By: Genevie Ann M.D.   On: 05/11/2020 12:03   DG HIP UNILAT W OR W/O PELVIS 2-3 VIEWS RIGHT  Result Date: 05/11/2020 CLINICAL DATA:  Status post right hip total arthroplasty EXAM: DG HIP (WITH OR WITHOUT PELVIS) 2-3V RIGHT COMPARISON:  None. FINDINGS: Status post  right hip total arthroplasty with expected overlying postoperative changes. No evidence of perihardware fracture or component malpositioning. IMPRESSION: Status post right hip total arthroplasty with expected overlying postoperative changes Electronically Signed   By: Eddie Candle M.D.   On: 05/11/2020 12:35    Disposition:     Follow-up Information    Duanne Guess, PA-C Follow up.   Specialties: Orthopedic Surgery, Emergency Medicine Contact information: [place] [address] [phone]            Signed: Feliberto Gottron 05/13/2020, 3:41 PM

## 2020-05-13 NOTE — Plan of Care (Signed)

## 2020-11-25 ENCOUNTER — Other Ambulatory Visit: Payer: Self-pay | Admitting: Family Medicine

## 2020-11-29 ENCOUNTER — Telehealth: Payer: Self-pay | Admitting: Family Medicine

## 2020-11-29 DIAGNOSIS — I10 Essential (primary) hypertension: Secondary | ICD-10-CM

## 2020-11-29 DIAGNOSIS — E7800 Pure hypercholesterolemia, unspecified: Secondary | ICD-10-CM

## 2020-11-29 DIAGNOSIS — D696 Thrombocytopenia, unspecified: Secondary | ICD-10-CM

## 2020-11-29 DIAGNOSIS — R7303 Prediabetes: Secondary | ICD-10-CM

## 2020-11-29 NOTE — Telephone Encounter (Signed)
-----   Message from Ellamae Sia sent at 11/23/2020  4:07 PM EST ----- Regarding: lab orders for Tuesday, 12.21.21 Patient is scheduled for CPX labs, please order future labs, Thanks , Karna Christmas

## 2020-12-08 ENCOUNTER — Other Ambulatory Visit (INDEPENDENT_AMBULATORY_CARE_PROVIDER_SITE_OTHER): Payer: Medicare Other

## 2020-12-08 ENCOUNTER — Other Ambulatory Visit: Payer: Self-pay

## 2020-12-08 ENCOUNTER — Ambulatory Visit (INDEPENDENT_AMBULATORY_CARE_PROVIDER_SITE_OTHER): Payer: Medicare Other

## 2020-12-08 DIAGNOSIS — Z0000 Encounter for general adult medical examination without abnormal findings: Secondary | ICD-10-CM

## 2020-12-08 DIAGNOSIS — I10 Essential (primary) hypertension: Secondary | ICD-10-CM | POA: Diagnosis not present

## 2020-12-08 DIAGNOSIS — E7800 Pure hypercholesterolemia, unspecified: Secondary | ICD-10-CM

## 2020-12-08 DIAGNOSIS — R7303 Prediabetes: Secondary | ICD-10-CM

## 2020-12-08 DIAGNOSIS — D696 Thrombocytopenia, unspecified: Secondary | ICD-10-CM | POA: Diagnosis not present

## 2020-12-08 LAB — CBC WITH DIFFERENTIAL/PLATELET
Basophils Absolute: 0 10*3/uL (ref 0.0–0.1)
Basophils Relative: 1.2 % (ref 0.0–3.0)
Eosinophils Absolute: 0.2 10*3/uL (ref 0.0–0.7)
Eosinophils Relative: 5.7 % — ABNORMAL HIGH (ref 0.0–5.0)
HCT: 40.7 % (ref 36.0–46.0)
Hemoglobin: 13.8 g/dL (ref 12.0–15.0)
Lymphocytes Relative: 37 % (ref 12.0–46.0)
Lymphs Abs: 1.5 10*3/uL (ref 0.7–4.0)
MCHC: 34 g/dL (ref 30.0–36.0)
MCV: 85.2 fl (ref 78.0–100.0)
Monocytes Absolute: 0.3 10*3/uL (ref 0.1–1.0)
Monocytes Relative: 7.6 % (ref 3.0–12.0)
Neutro Abs: 1.9 10*3/uL (ref 1.4–7.7)
Neutrophils Relative %: 48.5 % (ref 43.0–77.0)
Platelets: 132 10*3/uL — ABNORMAL LOW (ref 150.0–400.0)
RBC: 4.78 Mil/uL (ref 3.87–5.11)
RDW: 13.2 % (ref 11.5–15.5)
WBC: 4 10*3/uL (ref 4.0–10.5)

## 2020-12-08 LAB — COMPREHENSIVE METABOLIC PANEL
ALT: 9 U/L (ref 0–35)
AST: 12 U/L (ref 0–37)
Albumin: 4.4 g/dL (ref 3.5–5.2)
Alkaline Phosphatase: 84 U/L (ref 39–117)
BUN: 19 mg/dL (ref 6–23)
CO2: 30 mEq/L (ref 19–32)
Calcium: 10 mg/dL (ref 8.4–10.5)
Chloride: 106 mEq/L (ref 96–112)
Creatinine, Ser: 0.94 mg/dL (ref 0.40–1.20)
GFR: 59.62 mL/min — ABNORMAL LOW (ref >60.00)
Glucose, Bld: 96 mg/dL (ref 70–99)
Potassium: 4.2 mEq/L (ref 3.5–5.1)
Sodium: 143 mEq/L (ref 135–145)
Total Bilirubin: 0.6 mg/dL (ref 0.2–1.2)
Total Protein: 6.5 g/dL (ref 6.0–8.3)

## 2020-12-08 LAB — LIPID PANEL
Cholesterol: 163 mg/dL (ref 0–200)
HDL: 54 mg/dL (ref >39.00)
LDL Cholesterol: 88 mg/dL (ref 0–99)
NonHDL: 109.33
Total CHOL/HDL Ratio: 3
Triglycerides: 107 mg/dL (ref 0.0–149.0)
VLDL: 21.4 mg/dL (ref 0.0–40.0)

## 2020-12-08 LAB — TSH: TSH: 2.91 u[IU]/mL (ref 0.35–4.50)

## 2020-12-08 LAB — HEMOGLOBIN A1C: Hgb A1c MFr Bld: 5.7 % (ref 4.6–6.5)

## 2020-12-08 NOTE — Progress Notes (Signed)
PCP notes:  Health Maintenance: Cologuard- due   Abnormal Screenings: none   Patient concerns: none   Nurse concerns: none   Next PCP appt.: 12/15/2020 @ 8:30 am

## 2020-12-08 NOTE — Progress Notes (Signed)
Subjective:   Natasha [name] is a 74 y.o. female who presents for Medicare Annual (Subsequent) preventive examination.  Review of Systems: N/A      I connected with the patient today by telephone and verified that I am speaking with the correct person using two identifiers. Location patient: home Location nurse: work Persons participating in the telephone visit: patient, nurse.   I discussed the limitations, risks, security and privacy concerns of performing an evaluation and management service by telephone and the availability of in person appointments. I also discussed with the patient that there may be a patient responsible charge related to this service. The patient expressed understanding and verbally consented to this telephonic visit.        Cardiac Risk Factors include: advanced age (>71men, >23 women);hypertension;Other (see comment), Risk factor comments: hypercholesterolemia     Objective:    Today's Vitals   12/08/20 [id number]  PainSc: 10-Worst pain ever   There is no height or weight on file to calculate BMI.  Advanced Directives 12/08/2020 05/11/2020 05/05/2020 12/03/2019 11/27/2018 11/26/2017 11/24/2016  Does Patient Have a Medical Advance Directive? Yes Yes Yes Yes Yes Yes Yes  Type of Paramedic of [place];Living will Healthcare Power of Attorney Living will;Healthcare Power of [place];Living will [place];Living will [place];Living will [place];Living will  Does patient want to make changes to medical advance directive? - No - Patient declined No - Patient declined - - - -  Copy of [place] in Chart? Yes - validated most recent copy scanned in chart (See row information) - No - copy requested Yes - validated most recent copy scanned in chart (See row information) Yes - validated most recent copy scanned in chart (See row information) Yes  Yes    Current Medications (verified) Outpatient Encounter Medications as of 12/08/2020  Medication Sig  . amLODipine (NORVASC) 5 MG tablet TAKE ONE TABLET EVERY DAY  . atorvastatin (LIPITOR) 20 MG tablet TAKE ONE TABLET EVERY DAY  . cetirizine (ZYRTEC) 10 MG tablet Take 10 mg by mouth daily.  . Cholecalciferol (VITAMIN D-3) 1000 UNITS CAPS Take 2,000 Units by mouth daily.   Marland Kitchen losartan-hydrochlorothiazide (HYZAAR) 100-12.5 MG tablet TAKE 1/2 TABLET EVERY DAY  . pantoprazole (PROTONIX) 40 MG tablet TAKE ONE TABLET EVERY DAY  . acetaminophen (TYLENOL) 500 MG tablet Take 1,000 mg by mouth every 6 (six) hours as needed for moderate pain or headache.  . docusate sodium (COLACE) 100 MG capsule Take 1 capsule (100 mg total) by mouth 2 (two) times daily.  Marland Kitchen enoxaparin (LOVENOX) 40 MG/0.4ML injection Inject 0.4 mLs (40 mg total) into the skin daily for 14 days.  Marland Kitchen HYDROcodone-acetaminophen (NORCO/VICODIN) 5-325 MG tablet Take 1 tablet by mouth every 6 (six) hours as needed for severe pain.  . traMADol (ULTRAM) 50 MG tablet Take 50 mg by mouth every 6 (six) hours as needed.  . traMADol (ULTRAM) 50 MG tablet Take 1 tablet (50 mg total) by mouth every 6 (six) hours as needed.   No facility-administered encounter medications on file as of 12/08/2020.    Allergies (verified) Clindamycin/lincomycin and Naproxen sodium   History: Past Medical History:  Diagnosis Date  . CAD (coronary artery disease)    (4/05 carotid dopler- 39% stenosis) , (ECHO- mild LVH, EF 60%)  . Cancer ([place]) 2010   skin cancer right cheek  . CHF (congestive heart failure) ([place])  patient unaware of this diagnosis. may have been over 30 years ago.  . DDD (degenerative disc disease), lumbar 11/2006   L5-S1 bulging disk  . DDD (degenerative disc disease), lumbar   . GERD (gastroesophageal reflux disease)   . Headache syndrome 04/30/2017   Left occipital and ear   . Hyperlipidemia   . Hypertension   . Osteopenia   .  Spinal stenosis   . Thrombocytopenia ([place])    mild   Past Surgical History:  Procedure Laterality Date  . APPENDECTOMY    . BACK SURGERY  2004   fall at work. NO surgery, just back injections  . BREAST BIOPSY Right 2017   core- neg  . CHOLECYSTECTOMY    . SHOULDER SURGERY  2004   fall.  shoulder arthroscopy.  RCR.  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 TUBAL LIGATION     Family History  Problem Relation Age of Onset  . Coronary artery disease Father   . Lung cancer Sister   . Breast cancer Maternal Grandmother   . Diabetes Sister   . Other Sister        OP  . Other Mother        has pacemaker   Social History   Socioeconomic History  . Marital status: Married    Spouse name: Milicent Polhill  . Number of children: 4  . Years of education: 28  . Highest education level: Not on file  Occupational History  . Occupation: Pharmacologist- Retired    Fish farm manager: [employer]  Tobacco Use  . Smoking status: Never Smoker  . Smokeless tobacco: Never Used  Vaping Use  . Vaping Use: Never used  Substance and Sexual Activity  . Alcohol use: No    Alcohol/week: 0.0 standard drinks  . Drug use: No  . Sexual activity: Not Currently  Other Topics Concern  . Not on file  Social History Narrative   Lives with husband, Fritz Pickerel (he is 74 years older than patient. Unable to provide her much assistance after surgery)    All 4 daughters will be helping patient out at home after surgery.   Caffeine use: 1 cup coffee every morning   Soda rare   Social Determinants of Health   Financial Resource Strain: Low Risk   . Difficulty of Paying Living Expenses: Not hard at all  Food Insecurity: No Food Insecurity  . Worried About Charity fundraiser in the Last Year: Never true  . Ran Out of Food in the Last Year: Never true   Transportation Needs: No Transportation Needs  . Lack of Transportation (Medical): No  . Lack of Transportation (Non-Medical): No  Physical Activity: Inactive  . Days of Exercise per Week: 0 days  . Minutes of Exercise per Session: 0 min  Stress: No Stress Concern Present  . Feeling of Stress : Not at all  Social Connections: Not on file    Tobacco Counseling Counseling given: Not Answered   Clinical Intake:  Pre-visit preparation completed: Yes  Pain : 0-10 Pain Score: 10-Worst pain ever Pain Type: Chronic pain Pain Location: Hip Pain Orientation: Left Pain Descriptors / Indicators: Aching Pain Onset: More than a month ago Pain Frequency: Intermittent     Nutritional Risks: None Diabetes: No  How often do you need to have someone help you when you read instructions, pamphlets,  or other written materials from your doctor or pharmacy?: 1 - Never What is the last grade level you completed in school?: 12th  Diabetic: No Nutrition Risk Assessment:  Has the patient had any N/V/D within the last 2 months?  No  Does the patient have any non-healing wounds?  No  Has the patient had any unintentional weight loss or weight gain?  No   Diabetes:  Is the patient diabetic?  No  If diabetic, was a CBG obtained today?  N/A Did the patient bring in their glucometer from home?  N/A How often do you monitor your CBG's? N/A.   Financial Strains and Diabetes Management:  Are you having any financial strains with the device, your supplies or your medication? N/A.  Does the patient want to be seen by Chronic Care Management for management of their diabetes?  N/A Would the patient like to be referred to a Nutritionist or for Diabetic Management?  N/A   Interpreter Needed?: No  Information entered by :: CJohnson, LPN   Activities of Daily Living In your present state of health, do you have any difficulty performing the following activities: 12/08/2020 05/11/2020  Hearing? N N   Vision? N N  Difficulty concentrating or making decisions? N N  Walking or climbing stairs? N Y  Dressing or bathing? N N  Doing errands, shopping? N N  Preparing Food and eating ? N -  Using the Toilet? N -  In the past six months, have you accidently leaked urine? Y -  Comment wears pads for protection -  Do you have problems with loss of bowel control? N -  Managing your Medications? N -  Managing your Finances? N -  Housekeeping or managing your Housekeeping? N -  Some recent data might be hidden    Patient Care Team: Tower, Audrie Gallus, MD as PCP - General Lew Dawes Sheria Lang, OD as Consulting Physician (Optometry) York Spaniel, MD as Consulting Physician (Neurology) Bartholomew Boards, DDS as Referring Physician (Dentistry)  Indicate any recent Medical Services you may have received from other than [facility] providers in the past year (date may be approximate).     Assessment:   This is a routine wellness examination for Desirae.  Hearing/Vision screen  Hearing Screening   125Hz  250Hz  500Hz  1000Hz  2000Hz  3000Hz  4000Hz  6000Hz  8000Hz   Right ear:           Left ear:           Vision Screening Comments: Patient gets annual eye exams   Dietary issues and exercise activities discussed: Current Exercise Habits: The patient does not participate in regular exercise at present, Exercise limited by: None identified;orthopedic condition(s)  Goals    . Follow up with Primary Care Provider     Starting 11/27/2018, I will continue to take medications as prescribed and to follow up with PCP as scheduled.     . Patient Stated     12/03/2019, I will continue to eat more healthier and try to lose some weight.     . Patient Stated     12/08/2020, I will maintain and continue medications as prescribed.       Depression Screen PHQ 2/9 Scores 12/08/2020 12/03/2019 11/27/2018 11/26/2017 11/24/2016 11/22/2015 11/17/2014  PHQ - 2 Score 0 0 0 0 0 0 0  PHQ- 9 Score 0 0 0 0 - - -    Fall Risk Fall  Risk  12/08/2020 12/03/2019 11/27/2018 11/26/2017 11/24/2016  Falls in the past year?  0 0 1 No No  Comment - - fell after missing a step; bruise to right hip - -  Number falls in past yr: 0 0 0 - -  Injury with Fall? 0 0 1 - -  Risk for fall due to : Medication side effect;Impaired balance/gait Medication side effect - - -  Follow up Falls evaluation completed;Falls prevention discussed Falls evaluation completed;Falls prevention discussed - - -    FALL RISK PREVENTION PERTAINING TO THE HOME:  Any stairs in or around the home? Yes  If so, are there any without handrails? No  Home free of loose throw rugs in walkways, pet beds, electrical cords, etc? Yes  Adequate lighting in your home to reduce risk of falls? Yes   ASSISTIVE DEVICES UTILIZED TO PREVENT FALLS:  Life alert? No  Use of a cane, walker or w/c? Yes  Grab bars in the bathroom? No  Shower chair or bench in shower? No  Elevated toilet seat or a handicapped toilet? Yes   TIMED UP AND GO:  Was the test performed? N/A, telephone visit.   Cognitive Function: MMSE - Mini Mental State Exam 12/08/2020 12/03/2019 11/27/2018 11/26/2017 11/24/2016  Orientation to time 5 5 5 5 5   Orientation to Place 5 5 5 5 5   Registration 3 3 3 3 3   Attention/ Calculation 5 5 0 0 0  Recall 3 3 1 3 3   Recall-comments - - unable to recall 2 of 3 words - -  Language- name 2 objects - - 0 0 0  Language- repeat 1 1 1 1 1   Language- follow 3 step command - - 3 3 3   Language- read & follow direction - - 0 0 0  Write a sentence - - 0 0 0  Copy design - - 0 0 0  Total score - - 18 20 20   Mini Cog  Mini-Cog screen was completed. Maximum score is 22. A value of 0 denotes this part of the MMSE was not completed or the patient failed this part of the Mini-Cog screening.       Immunizations Immunization History  Administered Date(s) Administered  . Fluad Quad(high Dose 65+) 08/28/2019  . Influenza Split 09/24/2012  . Influenza, High Dose  Seasonal PF 10/03/2016, 09/25/2018  . Influenza,inj,Quad PF,6+ Mos 10/28/2013, 10/14/2014  . Influenza-Unspecified 09/06/2015, 09/11/2017, 09/17/2020  . PFIZER SARS-COV-2 Vaccination 04/01/2020, 04/22/2020, 11/02/2020  . Pneumococcal Conjugate-13 11/17/2014  . Pneumococcal Polysaccharide-23 10/28/2013  . Td 12/11/2002  . Tdap 03/06/2014  . Zoster Recombinat (Shingrix) 10/07/2020    TDAP status: Up to date  Flu Vaccine status: Up to date  Pneumococcal vaccine status: Up to date  Covid-19 vaccine status: Completed vaccines  Qualifies for Shingles Vaccine? Yes   Zostavax completed No   Shingrix Completed: up to date   Screening Tests Health Maintenance  Topic Date Due  . COLON CANCER SCREENING ANNUAL FOBT  12/02/2016  . Fecal DNA (Cologuard)  12/27/2019  . MAMMOGRAM  01/25/2021  . COVID-19 Vaccine (4 - Booster for Pfizer series) 05/02/2021  . TETANUS/TDAP  03/06/2024  . INFLUENZA VACCINE  Completed  . DEXA SCAN  Completed  . Hepatitis C Screening  Completed  . PNA vac Low Risk Adult  Completed  . PAP SMEAR-Modifier  Discontinued    Health Maintenance  Health Maintenance Due  Topic Date Due  . COLON CANCER SCREENING ANNUAL FOBT  12/02/2016  . Fecal DNA (Cologuard)  12/27/2019    Colorectal cancer screening: Cologuard due, will  discuss with provider at physical  Mammogram status: Completed 01/26/2020. Repeat every year  Bone Density status: Completed 01/23/2019. Results reflect: Bone density results: OSTEOPENIA. Repeat every 2 years.  Lung Cancer Screening: (Low Dose CT Chest recommended if Age 66-80 years, 30 pack-year currently smoking OR have quit w/in 15 years.) does not qualify.    Additional Screening:  Hepatitis C Screening: does qualify; Completed 11/24/2016  Vision Screening: Recommended annual ophthalmology exams for early detection of glaucoma and other disorders of the eye. Is the patient up to date with their annual eye exam?  Yes  Who is the  provider or what is the name of the office in which the patient attends annual eye exams? Dr. Phineas Douglas If pt is not established with a provider, would they like to be referred to a provider to establish care? No .   Dental Screening: Recommended annual dental exams for proper oral hygiene  Community Resource Referral / Chronic Care Management: CRR required this visit?  No   CCM required this visit?  No      Plan:     I have personally reviewed and noted the following in the patient's chart:   . Medical and social history . Use of alcohol, tobacco or illicit drugs  . Current medications and supplements . Functional ability and status . Nutritional status . Physical activity . Advanced directives . List of other physicians . Hospitalizations, surgeries, and ER visits in previous 12 months . Vitals . Screenings to include cognitive, depression, and falls . Referrals and appointments  In addition, I have reviewed and discussed with patient certain preventive protocols, quality metrics, and best practice recommendations. A written personalized care plan for preventive services as well as general preventive health recommendations were provided to patient.   Due to this being a telephonic visit, the after visit summary with patients personalized plan was offered to patient via office or my-chart. Patient preferred to pick up at office at next visit or via mychart.   Andrez Grime, LPN   [id number]

## 2020-12-08 NOTE — Patient Instructions (Signed)
Natasha [name] , Thank you for taking time to come for your Medicare Wellness Visit. I appreciate your ongoing commitment to your health goals. Please review the following plan we discussed and let me know if I can assist you in the future.   Screening recommendations/referrals: Colonoscopy: Cologuard due  Mammogram: Up to date, completed 01/26/2020, due 01/2021 Bone Density: Up to date, completed 01/23/2019, due 01/2021 Recommended yearly ophthalmology/optometry visit for glaucoma screening and checkup Recommended yearly dental visit for hygiene and checkup  Vaccinations: Influenza vaccine: Up to date, completed 09/17/2020, due 07/2021 Pneumococcal vaccine: Completed series Tdap vaccine: Up to date, completed 03/06/2014, due 02/2024 Shingles vaccine: Up to date, completed 10/07/2020, due 2-6 months    Covid-19:Completed series  Advanced directives: copy in chart  Conditions/risks identified: hypertension, hypercholesterolemia   Next appointment: Follow up in one year for your annual wellness visit    Preventive Care 74 Years and Older, Female Preventive care refers to lifestyle choices and visits with your health care provider that can promote health and wellness. What does preventive care include?  A yearly physical exam. This is also called an annual well check.  Dental exams once or twice a year.  Routine eye exams. Ask your health care provider how often you should have your eyes checked.  Personal lifestyle choices, including:  Daily care of your teeth and gums.  Regular physical activity.  Eating a healthy diet.  Avoiding tobacco and drug use.  Limiting alcohol use.  Practicing safe sex.  Taking low-dose aspirin every day.  Taking vitamin and mineral supplements as recommended by your health care provider. What happens during an annual well check? The services and screenings done by your health care provider during your annual well check will depend on your age, overall  health, lifestyle risk factors, and family history of disease. Counseling  Your health care provider may ask you questions about your:  Alcohol use.  Tobacco use.  Drug use.  Emotional well-being.  Home and relationship well-being.  Sexual activity.  Eating habits.  History of falls.  Memory and ability to understand (cognition).  Work and work Astronomer.  Reproductive health. Screening  You may have the following tests or measurements:  Height, weight, and BMI.  Blood pressure.  Lipid and cholesterol levels. These may be checked every 5 years, or more frequently if you are over 80 years old.  Skin check.  Lung cancer screening. You may have this screening every year starting at age 74 if you have a 30-pack-year history of smoking and currently smoke or have quit within the past 15 years.  Fecal occult blood test (FOBT) of the stool. You may have this test every year starting at age 74.  Flexible sigmoidoscopy or colonoscopy. You may have a sigmoidoscopy every 5 years or a colonoscopy every 10 years starting at age 74.  Hepatitis C blood test.  Hepatitis B blood test.  Sexually transmitted disease (STD) testing.  Diabetes screening. This is done by checking your blood sugar (glucose) after you have not eaten for a while (fasting). You may have this done every 1-3 years.  Bone density scan. This is done to screen for osteoporosis. You may have this done starting at age 74.  Mammogram. This may be done every 1-2 years. Talk to your health care provider about how often you should have regular mammograms. Talk with your health care provider about your test results, treatment options, and if necessary, the need for more tests. Vaccines  Your health  care provider may recommend certain vaccines, such as:  Influenza vaccine. This is recommended every year.  Tetanus, diphtheria, and acellular pertussis (Tdap, Td) vaccine. You may need a Td booster every 10  years.  Zoster vaccine. You may need this after age 74.  Pneumococcal 13-valent conjugate (PCV13) vaccine. One dose is recommended after age 74.  Pneumococcal polysaccharide (PPSV23) vaccine. One dose is recommended after age 74. Talk to your health care provider about which screenings and vaccines you need and how often you need them. This information is not intended to replace advice given to you by your health care provider. Make sure you discuss any questions you have with your health care provider. Document Released: 12/24/2015 Document Revised: 08/16/2016 Document Reviewed: 09/28/2015 Elsevier Interactive Patient Education  2017 [place] Prevention in the Home Falls can cause injuries. They can happen to people of all ages. There are many things you can do to make your home safe and to help prevent falls. What can I do on the outside of my home?  Regularly fix the edges of walkways and driveways and fix any cracks.  Remove anything that might make you trip as you walk through a door, such as a raised step or threshold.  Trim any bushes or trees on the path to your home.  Use bright outdoor lighting.  Clear any walking paths of anything that might make someone trip, such as rocks or tools.  Regularly check to see if handrails are loose or broken. Make sure that both sides of any steps have handrails.  Any raised decks and porches should have guardrails on the edges.  Have any leaves, snow, or ice cleared regularly.  Use sand or salt on walking paths during winter.  Clean up any spills in your garage right away. This includes oil or grease spills. What can I do in the bathroom?  Use night lights.  Install grab bars by the toilet and in the tub and shower. Do not use towel bars as grab bars.  Use non-skid mats or decals in the tub or shower.  If you need to sit down in the shower, use a plastic, non-slip stool.  Keep the floor dry. Clean up any water that  spills on the floor as soon as it happens.  Remove soap buildup in the tub or shower regularly.  Attach bath mats securely with double-sided non-slip rug tape.  Do not have throw rugs and other things on the floor that can make you trip. What can I do in the bedroom?  Use night lights.  Make sure that you have a light by your bed that is easy to reach.  Do not use any sheets or blankets that are too big for your bed. They should not hang down onto the floor.  Have a firm chair that has side arms. You can use this for support while you get dressed.  Do not have throw rugs and other things on the floor that can make you trip. What can I do in the kitchen?  Clean up any spills right away.  Avoid walking on wet floors.  Keep items that you use a lot in easy-to-reach places.  If you need to reach something above you, use a strong step stool that has a grab bar.  Keep electrical cords out of the way.  Do not use floor polish or wax that makes floors slippery. If you must use wax, use non-skid floor wax.  Do not have  throw rugs and other things on the floor that can make you trip. What can I do with my stairs?  Do not leave any items on the stairs.  Make sure that there are handrails on both sides of the stairs and use them. Fix handrails that are broken or loose. Make sure that handrails are as long as the stairways.  Check any carpeting to make sure that it is firmly attached to the stairs. Fix any carpet that is loose or worn.  Avoid having throw rugs at the top or bottom of the stairs. If you do have throw rugs, attach them to the floor with carpet tape.  Make sure that you have a light switch at the top of the stairs and the bottom of the stairs. If you do not have them, ask someone to add them for you. What else can I do to help prevent falls?  Wear shoes that:  Do not have high heels.  Have rubber bottoms.  Are comfortable and fit you well.  Are closed at the  toe. Do not wear sandals.  If you use a stepladder:  Make sure that it is fully opened. Do not climb a closed stepladder.  Make sure that both sides of the stepladder are locked into place.  Ask someone to hold it for you, if possible.  Clearly mark and make sure that you can see:  Any grab bars or handrails.  First and last steps.  Where the edge of each step is.  Use tools that help you move around (mobility aids) if they are needed. These include:  Canes.  Walkers.  Scooters.  Crutches.  Turn on the lights when you go into a dark area. Replace any light bulbs as soon as they burn out.  Set up your furniture so you have a clear path. Avoid moving your furniture around.  If any of your floors are uneven, fix them.  If there are any pets around you, be aware of where they are.  Review your medicines with your doctor. Some medicines can make you feel dizzy. This can increase your chance of falling. Ask your doctor what other things that you can do to help prevent falls. This information is not intended to replace advice given to you by your health care provider. Make sure you discuss any questions you have with your health care provider. Document Released: 09/23/2009 Document Revised: 05/04/2016 Document Reviewed: 01/01/2015 Elsevier Interactive Patient Education  2017 [employer].

## 2020-12-15 ENCOUNTER — Encounter: Payer: Self-pay | Admitting: Family Medicine

## 2020-12-15 ENCOUNTER — Ambulatory Visit (INDEPENDENT_AMBULATORY_CARE_PROVIDER_SITE_OTHER): Payer: Medicare Other | Admitting: Family Medicine

## 2020-12-15 ENCOUNTER — Other Ambulatory Visit: Payer: Self-pay

## 2020-12-15 VITALS — BP 126/80 | HR 68 | Temp 97.3°F | Ht 64.5 in | Wt 183.1 lb

## 2020-12-15 DIAGNOSIS — E7800 Pure hypercholesterolemia, unspecified: Secondary | ICD-10-CM

## 2020-12-15 DIAGNOSIS — R7303 Prediabetes: Secondary | ICD-10-CM

## 2020-12-15 DIAGNOSIS — Z8679 Personal history of other diseases of the circulatory system: Secondary | ICD-10-CM | POA: Diagnosis not present

## 2020-12-15 DIAGNOSIS — E6609 Other obesity due to excess calories: Secondary | ICD-10-CM

## 2020-12-15 DIAGNOSIS — E2839 Other primary ovarian failure: Secondary | ICD-10-CM

## 2020-12-15 DIAGNOSIS — I509 Heart failure, unspecified: Secondary | ICD-10-CM

## 2020-12-15 DIAGNOSIS — M8589 Other specified disorders of bone density and structure, multiple sites: Secondary | ICD-10-CM

## 2020-12-15 DIAGNOSIS — D696 Thrombocytopenia, unspecified: Secondary | ICD-10-CM

## 2020-12-15 DIAGNOSIS — I10 Essential (primary) hypertension: Secondary | ICD-10-CM

## 2020-12-15 DIAGNOSIS — E66811 Obesity, class 1: Secondary | ICD-10-CM

## 2020-12-15 DIAGNOSIS — Z1211 Encounter for screening for malignant neoplasm of colon: Secondary | ICD-10-CM

## 2020-12-15 DIAGNOSIS — Z0000 Encounter for general adult medical examination without abnormal findings: Secondary | ICD-10-CM

## 2020-12-15 DIAGNOSIS — Z6830 Body mass index (BMI) 30.0-30.9, adult: Secondary | ICD-10-CM

## 2020-12-15 MED ORDER — LOSARTAN POTASSIUM-HCTZ 100-12.5 MG PO TABS: 0.5000 | ORAL_TABLET | Freq: Every day | ORAL | 3 refills | Status: DC

## 2020-12-15 MED ORDER — AMLODIPINE BESYLATE 5 MG PO TABS: 5.0000 mg | ORAL_TABLET | Freq: Every day | ORAL | 3 refills | Status: DC

## 2020-12-15 MED ORDER — ATORVASTATIN CALCIUM 20 MG PO TABS: 20.0000 mg | ORAL_TABLET | Freq: Every day | ORAL | 3 refills | Status: DC

## 2020-12-15 MED ORDER — PANTOPRAZOLE SODIUM 40 MG PO TBEC: 40.0000 mg | DELAYED_RELEASE_TABLET | Freq: Every day | ORAL | 3 refills | Status: DC

## 2020-12-15 NOTE — Assessment & Plan Note (Signed)
bp in fair control at this time  BP Readings from Last 1 Encounters:  12/15/20 126/80   No changes needed Most recent labs reviewed  Disc lifstyle change with low sodium diet and exercise  Plan to continue amlodipine 5 mg daily and losartan hct 50-6.25 mg daily

## 2020-12-15 NOTE — Assessment & Plan Note (Signed)
Due for 2 y dexa in mid feb, pt will schedule after recovery from hip replacement  Order done  No falls or fx Continues vit d Took miacalcin in the past   Disc need for calcium/ vitamin D/ wt bearing exercise and bone density test every 2 y to monitor Disc safety/ fracture risk in detail

## 2020-12-15 NOTE — Assessment & Plan Note (Signed)
Ordered cologuard (3 y is up)

## 2020-12-15 NOTE — Assessment & Plan Note (Signed)
Stable overall Platelet ct is 132 (no anemia)  No bleeding/bruising Will continue to monitor

## 2020-12-15 NOTE — Assessment & Plan Note (Signed)
Reviewed health habits including diet and exercise and skin cancer prevention Reviewed appropriate screening tests for age  Also reviewed health mt list, fam hx and immunization status , as well as social and family history   See HPI Labs reviewed  covid vaccinated with booster  Had shingrix vaccines Will schedule her mammogram and dexa when due (after recovery from hip repl) No falls or fractures, taking vit D cologuard ordered for colon cancer screening

## 2020-12-15 NOTE — Assessment & Plan Note (Signed)
Disc goals for lipids and reasons to control them Rev last labs with pt Rev low sat fat diet in detail Continues atorvastatin 20 mg daily  LDL up slt after holiday eating

## 2020-12-15 NOTE — Progress Notes (Signed)
Subjective:    Patient ID: Natasha [name], female    DOB: 1946-10-07, 75 y.o.   MRN: [id number]  This visit occurred during the SARS-CoV-2 public health emergency.  Safety protocols were in place, including screening questions prior to the visit, additional usage of staff PPE, and extensive cleaning of exam room while observing appropriate contact time as indicated for disinfecting solutions.    HPI Here for health maintenance exam and to review chronic medical problems    Wt Readings from Last 3 Encounters:  12/15/20 183 lb 1 oz (83 kg)  05/11/20 179 lb 14.3 oz (81.6 kg)  05/05/20 180 lb (81.6 kg)   30.94 kg/m  Feeling ok  Had low key holidays   Had hip repl  Has another one planned next month   Eventually-bladder tack    Had amw on 12/29   Zoster-had shingrix vaccine  covid vaccinated with booster   cologuard 1/18 - due   Mammogram 2/21-will schedule  Self breast exam - no lumps   dexa 2/20 -osteopenia  Falls/fx Taking vitamin D  Had miacalcin in the past  Does take chronic ppi   Tdap 3/15 pna vaccines utd   HTN bp is stable today  No cp or palpitations or headaches or edema  No side effects to medicines  BP Readings from Last 3 Encounters:  12/15/20 126/80  05/13/20 (!) 115/54  12/09/19 136/78    Takes amlodipine 5 mg daily Losartan hct 100-12.5  1/2 pill daily  Pulse Readings from Last 3 Encounters:  12/15/20 68  05/13/20 66  12/09/19 67    Remote hx of CHF No problems/symptoms at all    Lab Results  Component Value Date   CREATININE 0.94 12/08/2020   BUN 19 12/08/2020   NA 143 12/08/2020   K 4.2 12/08/2020   CL 106 12/08/2020   CO2 30 12/08/2020   Lab Results  Component Value Date   ALT 9 12/08/2020   AST 12 12/08/2020   ALKPHOS 84 12/08/2020   BILITOT 0.6 12/08/2020   H/o thrombocytopenia Lab Results  Component Value Date   WBC 4.0 12/08/2020   HGB 13.8 12/08/2020   HCT 40.7 12/08/2020   MCV 85.2 12/08/2020   PLT  132.0 (L) 12/08/2020   Fairly stable  No bleeding or bruising problems   Prediabetes Lab Results  Component Value Date   HGBA1C 5.7 12/08/2020  stable   Hyperlipidemia Lab Results  Component Value Date   CHOL 163 12/08/2020   CHOL 154 12/02/2019   CHOL 152 11/27/2018   Lab Results  Component Value Date   HDL 54.00 12/08/2020   HDL 46.50 12/02/2019   HDL 51.80 11/27/2018   Lab Results  Component Value Date   LDLCALC 88 12/08/2020   LDLCALC 73 12/02/2019   LDLCALC 71 11/27/2018   Lab Results  Component Value Date   TRIG 107.0 12/08/2020   TRIG 175.0 (H) 12/02/2019   TRIG 149.0 11/27/2018   Lab Results  Component Value Date   CHOLHDL 3 12/08/2020   CHOLHDL 3 12/02/2019   CHOLHDL 3 11/27/2018   No results found for: LDLDIRECT Taking atorvastatin 20 mg daily  Diet has been good overall  Watches sugar and fat in diet (less around the holidays)   Was walking 3 mi per d until 2nd hip acted up  Will get back to it after her next hip repl    Review of Systems  Constitutional: Negative for activity change, appetite change, fatigue, fever  and unexpected weight change.  HENT: Negative for congestion, ear pain, rhinorrhea, sinus pressure and sore throat.   Eyes: Negative for pain, redness and visual disturbance.  Respiratory: Negative for cough, shortness of breath and wheezing.   Cardiovascular: Negative for chest pain and palpitations.  Gastrointestinal: Negative for abdominal pain, blood in stool, constipation and diarrhea.  Endocrine: Negative for polydipsia and polyuria.  Genitourinary: Negative for dysuria, frequency and urgency.       Urinary incontinence  Musculoskeletal: Positive for arthralgias. Negative for back pain and myalgias.  Skin: Negative for pallor and rash.  Allergic/Immunologic: Negative for environmental allergies.  Neurological: Negative for dizziness, syncope and headaches.  Hematological: Negative for adenopathy. Does not bruise/bleed  easily.  Psychiatric/Behavioral: Negative for decreased concentration and dysphoric mood. The patient is not nervous/anxious.        Objective:   Physical Exam Constitutional:      General: She is not in acute distress.    Appearance: Normal appearance. She is well-developed. She is obese. She is not ill-appearing or diaphoretic.  HENT:     Head: Normocephalic and atraumatic.     Right Ear: Tympanic membrane, ear canal and external ear normal.     Left Ear: Tympanic membrane, ear canal and external ear normal.     Nose: Nose normal. No congestion.     Mouth/Throat:     Mouth: Mucous membranes are moist.     Pharynx: Oropharynx is clear. No posterior oropharyngeal erythema.  Eyes:     General: No scleral icterus.    Extraocular Movements: Extraocular movements intact.     Conjunctiva/sclera: Conjunctivae normal.     Pupils: Pupils are equal, round, and reactive to light.  Neck:     Thyroid: No thyromegaly.     Vascular: No carotid bruit or JVD.  Cardiovascular:     Rate and Rhythm: Normal rate and regular rhythm.     Pulses: Normal pulses.     Heart sounds: Normal heart sounds. No gallop.   Pulmonary:     Effort: Pulmonary effort is normal. No respiratory distress.     Breath sounds: Normal breath sounds. No wheezing.     Comments: Good air exch Chest:     Chest wall: No tenderness.  Abdominal:     General: Bowel sounds are normal. There is no distension or abdominal bruit.     Palpations: Abdomen is soft. There is no mass.     Tenderness: There is no abdominal tenderness.     Hernia: No hernia is present.  Genitourinary:    Comments: Breast exam: No mass, nodules, thickening, tenderness, bulging, retraction, inflamation, nipple discharge or skin changes noted.  No axillary or clavicular LA.     Musculoskeletal:        General: No tenderness. Normal range of motion.     Cervical back: Normal range of motion and neck supple. No rigidity. No muscular tenderness.     Right  lower leg: No edema.     Left lower leg: No edema.     Comments: No kyphosis  Limited rom of hip  Lymphadenopathy:     Cervical: No cervical adenopathy.  Skin:    General: Skin is warm and dry.     Coloration: Skin is not pale.     Findings: No erythema or rash.  Neurological:     Mental Status: She is alert. Mental status is at baseline.     Cranial Nerves: No cranial nerve deficit.     Motor:  No abnormal muscle tone.     Coordination: Coordination normal.     Gait: Gait normal.     Deep Tendon Reflexes: Reflexes are normal and symmetric. Reflexes normal.  Psychiatric:        Mood and Affect: Mood normal.        Cognition and Memory: Cognition and memory normal.           Assessment & Plan:   Problem List Items Addressed This Visit      Cardiovascular and Mediastinum   Essential hypertension    bp in fair control at this time  BP Readings from Last 1 Encounters:  12/15/20 126/80   No changes needed Most recent labs reviewed  Disc lifstyle change with low sodium diet and exercise  Plan to continue amlodipine 5 mg daily and losartan hct 50-6.25 mg daily      Relevant Medications   amLODipine (NORVASC) 5 MG tablet   atorvastatin (LIPITOR) 20 MG tablet   losartan-hydrochlorothiazide (HYZAAR) 100-12.5 MG tablet   Congestive heart failure (HCC)   Relevant Medications   amLODipine (NORVASC) 5 MG tablet   atorvastatin (LIPITOR) 20 MG tablet   losartan-hydrochlorothiazide (HYZAAR) 100-12.5 MG tablet     Musculoskeletal and Integument   Osteopenia    Due for 2 y dexa in mid feb, pt will schedule after recovery from hip replacement  Order done  No falls or fx Continues vit d Took miacalcin in the past   Disc need for calcium/ vitamin D/ wt bearing exercise and bone density test every 2 y to monitor Disc safety/ fracture risk in detail          Other   HYPERCHOLESTEROLEMIA    Disc goals for lipids and reasons to control them Rev last labs with pt Rev low  sat fat diet in detail Continues atorvastatin 20 mg daily  LDL up slt after holiday eating       Relevant Medications   amLODipine (NORVASC) 5 MG tablet   atorvastatin (LIPITOR) 20 MG tablet   losartan-hydrochlorothiazide (HYZAAR) 100-12.5 MG tablet   THROMBOCYTOPENIA    Stable overall Platelet ct is 132 (no anemia)  No bleeding/bruising Will continue to monitor       History of CHF (congestive heart failure)    No symptoms       Prediabetes    . Lab Results  Component Value Date   HGBA1C 5.7 12/08/2020   disc imp of low glycemic diet and wt loss to prevent DM2       Obesity    Discussed how this problem influences overall health and the risks it imposes  Reviewed plan for weight loss with lower calorie diet (via better food choices and also portion control or program like weight watchers) and exercise building up to or more than 30 minutes 5 days per week including some aerobic activity   Will get back to walking after hip repl /recovery      Special screening for malignant neoplasms, colon    Ordered cologuard (3 y is up)      Estrogen deficiency   Relevant Orders   DG Bone Density   Routine general medical examination at a health care facility - Primary    Reviewed health habits including diet and exercise and skin cancer prevention Reviewed appropriate screening tests for age  Also reviewed health mt list, fam hx and immunization status , as well as social and family history   See HPI Labs reviewed  covid vaccinated  with booster  Had shingrix vaccines Will schedule her mammogram and dexa when due (after recovery from hip repl) No falls or fractures, taking vit D cologuard ordered for colon cancer screening

## 2020-12-15 NOTE — Patient Instructions (Addendum)
We will renew cologuard order   Your mammogram and bone density test will be due next month-you can schedule that when you have recovered from surgery   Keep up the good work with healthy diet and exercise when you can   No change in medicines   Good luck with your surgery

## 2020-12-15 NOTE — Assessment & Plan Note (Signed)
Discussed how this problem influences overall health and the risks it imposes  Reviewed plan for weight loss with lower calorie diet (via better food choices and also portion control or program like weight watchers) and exercise building up to or more than 30 minutes 5 days per week including some aerobic activity   Will get back to walking after hip repl Natasha [name]

## 2020-12-15 NOTE — Assessment & Plan Note (Signed)
No symptoms 

## 2020-12-15 NOTE — Assessment & Plan Note (Signed)
.   Lab Results  Component Value Date   HGBA1C 5.7 12/08/2020   disc imp of low glycemic diet and wt loss to prevent DM2

## 2020-12-22 ENCOUNTER — Telehealth: Payer: Self-pay

## 2020-12-22 NOTE — Telephone Encounter (Signed)
Patient scheduled appointment with Anda Kraft on 12/23/20.

## 2020-12-22 NOTE — Telephone Encounter (Signed)
Pt said she saw Dr. Glori Bickers on last Weds and she let her know her head felt clogged up. She said her head still feels like this, she has a cough, runny nose, and a headache. She wants Dr. Glori Bickers to call her something in. I let her know she probably needs a virtual visit but patient wanted me to ask Dr. Glori Bickers about getting something for this. She said she has tried otc medications but they are not working.

## 2020-12-22 NOTE — Telephone Encounter (Signed)
Needs a virtual visit with first available

## 2020-12-23 ENCOUNTER — Encounter: Payer: Self-pay | Admitting: Primary Care

## 2020-12-23 ENCOUNTER — Other Ambulatory Visit: Payer: Medicare Other

## 2020-12-23 ENCOUNTER — Telehealth (INDEPENDENT_AMBULATORY_CARE_PROVIDER_SITE_OTHER): Payer: Medicare Other | Admitting: Primary Care

## 2020-12-23 VITALS — BP 144/87 | HR 74 | Temp 97.5°F | Ht 64.5 in | Wt 183.0 lb

## 2020-12-23 DIAGNOSIS — J3489 Other specified disorders of nose and nasal sinuses: Secondary | ICD-10-CM

## 2020-12-23 MED ORDER — PREDNISONE 20 MG PO TABS: ORAL_TABLET | ORAL | 0 refills | Status: DC

## 2020-12-23 NOTE — Progress Notes (Signed)
Patient has been experiencing headaches, cough, head congestion and runny nose x 9 days. Patient had a covid test done at home 4 days ago and was negative. She has been taking an OTC allergy medication and cough syrup for sx.

## 2020-12-23 NOTE — Patient Instructions (Signed)
Please come by for Covid-19 testing today as discussed.  Start prednisone 20 mg tablets. Take 2 tablets daily for 5 days.  Please call me if you develop fevers, shortness of breath, cough, and/or feel worse.  It was a pleasure meeting you!

## 2020-12-23 NOTE — Progress Notes (Signed)
Subjective:    Patient ID: Natasha [name], female    DOB: 10/08/1946, 75 y.o.   MRN: [id number]  HPI     Natasha Miueresa T Duchene - 75 y.o. female  MRN [id number]  Date of Birth: 08/08/1946  PCP: Judy Pimpleower, Marne A, MD  This service was provided via telemedicine. Phone Visit performed on 12/23/2020    Rationale for phone visit along with limitations reviewed. I discussed the limitations, risks, security and privacy concerns of performing a phone visit and the availability of in person appointments. I also discussed with the patient that there may be a patient responsible charge related to this service. Patient consented to telephone encounter.    Location of patient: Home Location of provider: Office [place] @ [facility] [place] Participants: patient and myself Name of referring provider: N/A   Names of persons and role in encounter: Provider: Doreene NestKatherine K Doylene Splinter, NP  Patient: Natasha Miueresa T Fulford  Other: N/A   Time on call: 11 in - 13 sec   Subjective: Chief Complaint  Patient presents with  . Acute Visit    Cough, HA, runny nose, head congestion     HPI:  Ms. Johney Framellred is a 75 year old female patient of Dr. Milinda Antisower with a history of hypertension, CAD, CHF, prediabetes, GERD who presents today with a chief complaint of cough.  Symptoms began with "laryngitis" (voice hoarse) about 11 days ago. She then began to feel nasal congestion, cough, tightness behind her eyes and nose, frontal headache. She denies fevers. She took a home Covid-19 test four days ago which was negative.   She's had three Covid-19 vaccines. She denies known exposure to Covid-19, fevers, loss of taste/smell, diarrhea. She lives with her husband who had the same symptoms which began a few days prior to hers. His symptoms have resolved. He did not take a Covid-19 test.   Now her symptoms mainly consist of frontal headache, pressure to eyes and behind her nose. She denies cough, shortness of breath, rhinorrhea. She's been taking her  Zyrtec daily, also Robitussin without much improvement. She has surgery pending for February 8th, she "must get rid of this mess before then".    Objective/Observations:  Alert and oriented. Sounds congested. No cough. No distress. Speaking in complete sentences.   No physical exam or vital signs collected unless specifically identified below.   BP (!) 144/87 (BP Location: Left Arm, Patient Position: Sitting, Cuff Size: Normal)   Pulse 74   Temp (!) 97.5 F (36.4 C) (Temporal)   Ht 5' 4.5" (1.638 m)   Wt 183 lb (83 kg)   LMP 12/11/1993   BMI 30.93 kg/m    Respiratory status: speaks in complete sentences without evident shortness of breath.   Assessment/Plan:   Acute what seems more like sinus/allergy/viral symptoms for 11 days, now with only head congestion.  No cough during visit, she does sound congested. Will set her up for Covid-19 testing. Rx for prednisone course sent to pharmacy for headache/pressure. She doesn't seem to have bacterial sinusitis, but will keep in differentials if Covid-19 test is negative. She certainly doesn't appear sickly or in distress.  Await results.   No problem-specific Assessment & Plan notes found for this encounter.   I discussed the assessment and treatment plan with the patient. The patient was provided an opportunity to ask questions and all were answered. The patient agreed with the plan and demonstrated an understanding of the instructions.  Lab Orders  No laboratory test(s) ordered today  No orders of the defined types were placed in this encounter.   The patient was advised to call back or seek an in-person evaluation if the symptoms worsen or if the condition fails to improve as anticipated.  Pleas Koch, NP    Review of Systems  Constitutional: Positive for fatigue. Negative for fever.  HENT: Positive for congestion, postnasal drip and sinus pressure. Negative for rhinorrhea.   Respiratory: Negative for  cough and shortness of breath.   Cardiovascular: Negative for chest pain.  Neurological: Positive for headaches.       Past Medical History:  Diagnosis Date  . CAD (coronary artery disease)    (4/05 carotid dopler- 39% stenosis) , (ECHO- mild LVH, EF 60%)  . Cancer ([place]) 2010   skin cancer right cheek  . CHF (congestive heart failure) ([place])    patient unaware of this diagnosis. may have been over 30 years ago.  . DDD (degenerative disc disease), lumbar 11/2006   L5-S1 bulging disk  . DDD (degenerative disc disease), lumbar   . GERD (gastroesophageal reflux disease)   . Headache syndrome 04/30/2017   Left occipital and ear   . Hyperlipidemia   . Hypertension   . Osteopenia   . Spinal stenosis   . Thrombocytopenia ([place])    mild     Social History   Socioeconomic History  . Marital status: Married    Spouse name: Aubryn Spinola  . Number of children: 4  . Years of education: 38  . Highest education level: Not on file  Occupational History  . Occupation: Pharmacologist- Retired    Fish farm manager: [employer]  Tobacco Use  . Smoking status: Never Smoker  . Smokeless tobacco: Never Used  Vaping Use  . Vaping Use: Never used  Substance and Sexual Activity  . Alcohol use: No    Alcohol/week: 0.0 standard drinks  . Drug use: No  . Sexual activity: Not Currently  Other Topics Concern  . Not on file  Social History Narrative   Lives with husband, Fritz Pickerel (he is 50 years older than patient. Unable to provide her much assistance after surgery)    All 4 daughters will be helping patient out at home after surgery.   Caffeine use: 1 cup coffee every morning   Soda rare   Social Determinants of Health   Financial Resource Strain: Low Risk   . Difficulty of Paying Living Expenses: Not hard at all  Food Insecurity: No Food Insecurity  . Worried About Charity fundraiser in the Last Year: Never true  . Ran Out of Food in the Last Year: Never true  Transportation Needs: No  Transportation Needs  . Lack of Transportation (Medical): No  . Lack of Transportation (Non-Medical): No  Physical Activity: Inactive  . Days of Exercise per Week: 0 days  . Minutes of Exercise per Session: 0 min  Stress: No Stress Concern Present  . Feeling of Stress : Not at all  Social Connections: Not on file  Intimate Partner Violence: Not At Risk  . Fear of Current or Ex-Partner: No  . Emotionally Abused: No  . Physically Abused: No  . Sexually Abused: No    Past Surgical History:  Procedure Laterality Date  . APPENDECTOMY    . BACK SURGERY  2004   fall at work. NO surgery, just back injections  . BREAST BIOPSY Right 2017   core- neg  . CHOLECYSTECTOMY    . SHOULDER SURGERY  2004   fall.  shoulder arthroscopy.  RCR.  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 TUBAL LIGATION      Family History  Problem Relation Age of Onset  . Coronary artery disease Father   . Lung cancer Sister   . Breast cancer Maternal Grandmother   . Diabetes Sister   . Other Sister        OP  . Other Mother        has pacemaker    Allergies  Allergen Reactions  . Clindamycin/Lincomycin Itching and Rash  . Naproxen Sodium     stomach pain.  Irritated stomach horribly.    Current Outpatient Medications on File Prior to Visit  Medication Sig Dispense Refill  . amLODipine (NORVASC) 5 MG tablet Take 1 tablet (5 mg total) by mouth daily. 90 tablet 3  . atorvastatin (LIPITOR) 20 MG tablet Take 1 tablet (20 mg total) by mouth daily. 90 tablet 3  . cetirizine (ZYRTEC) 10 MG tablet Take 10 mg by mouth daily.    . Cholecalciferol (VITAMIN D-3) 1000 UNITS CAPS Take 2,000 Units by mouth daily.     Marland Kitchen losartan-hydrochlorothiazide (HYZAAR) 100-12.5 MG tablet Take 0.5 tablets by mouth daily. 45 tablet 3  . pantoprazole (PROTONIX) 40 MG tablet Take  1 tablet (40 mg total) by mouth daily. 90 tablet 3   No current facility-administered medications on file prior to visit.    BP (!) 144/87 (BP Location: Left Arm, Patient Position: Sitting, Cuff Size: Normal)   Pulse 74   Temp (!) 97.5 F (36.4 C) (Temporal)   Ht 5' 4.5" (1.638 m)   Wt 183 lb (83 kg)   LMP 12/11/1993   BMI 30.93 kg/m    Objective:   Physical Exam Constitutional:      Appearance: She is not ill-appearing.  Pulmonary:     Effort: Pulmonary effort is normal.     Comments: No cough, sounds congested  Neurological:     Mental Status: She is alert and oriented to person, place, and time.  Psychiatric:        Mood and Affect: Mood normal.            Assessment & Plan:

## 2020-12-23 NOTE — Assessment & Plan Note (Signed)
Acute what seems more like sinus/allergy/viral symptoms for 11 days, now with only head congestion.  No cough during visit, she does sound congested. Will set her up for Covid-19 testing. Rx for prednisone course sent to pharmacy for headache/pressure. She doesn't seem to have bacterial sinusitis, but will keep in differentials if Covid-19 test is negative. She certainly doesn't appear sickly or in distress.  Await results.

## 2020-12-25 LAB — SPECIMEN STATUS REPORT

## 2020-12-25 LAB — NOVEL CORONAVIRUS, NAA: SARS-CoV-2, NAA: NOT DETECTED

## 2020-12-25 LAB — SARS-COV-2, NAA 2 DAY TAT

## 2021-01-04 LAB — COLOGUARD: Cologuard: NEGATIVE

## 2021-01-06 ENCOUNTER — Other Ambulatory Visit: Payer: Self-pay | Admitting: Orthopedic Surgery

## 2021-01-11 ENCOUNTER — Encounter: Payer: Self-pay | Admitting: Family Medicine

## 2021-01-12 ENCOUNTER — Encounter
Admission: RE | Admit: 2021-01-12 | Discharge: 2021-01-12 | Disposition: A | Discharge status: Home / Self Care | Payer: Medicare Other | Source: Ambulatory Visit | Attending: Orthopedic Surgery | Admitting: Orthopedic Surgery

## 2021-01-12 ENCOUNTER — Other Ambulatory Visit: Payer: Self-pay

## 2021-01-12 DIAGNOSIS — Z01818 Encounter for other preprocedural examination: Secondary | ICD-10-CM | POA: Diagnosis present

## 2021-01-12 DIAGNOSIS — I4510 Unspecified right bundle-branch block: Secondary | ICD-10-CM | POA: Insufficient documentation

## 2021-01-12 LAB — COMPREHENSIVE METABOLIC PANEL
ALT: 12 U/L (ref 0–44)
AST: 18 U/L (ref 15–41)
Albumin: 4.2 g/dL (ref 3.5–5.0)
Alkaline Phosphatase: 86 U/L (ref 38–126)
Anion gap: 10 (ref 5–15)
BUN: 17 mg/dL (ref 8–23)
CO2: 27 mmol/L (ref 22–32)
Calcium: 9.7 mg/dL (ref 8.9–10.3)
Chloride: 105 mmol/L (ref 98–111)
Creatinine, Ser: 0.86 mg/dL (ref 0.44–1.00)
GFR, Estimated: >60 mL/min (ref >60)
Glucose, Bld: 117 mg/dL — ABNORMAL HIGH (ref 70–99)
Potassium: 3.5 mmol/L (ref 3.5–5.1)
Sodium: 142 mmol/L (ref 135–145)
Total Bilirubin: 0.8 mg/dL (ref 0.3–1.2)
Total Protein: 7 g/dL (ref 6.5–8.1)

## 2021-01-12 LAB — CBC WITH DIFFERENTIAL/PLATELET
Abs Immature Granulocytes: 0.01 10*3/uL (ref 0.00–0.07)
Basophils Absolute: 0 10*3/uL (ref 0.0–0.1)
Basophils Relative: 1 %
Eosinophils Absolute: 0.1 10*3/uL (ref 0.0–0.5)
Eosinophils Relative: 4 %
HCT: 42.7 % (ref 36.0–46.0)
Hemoglobin: 14.3 g/dL (ref 12.0–15.0)
Immature Granulocytes: 0 %
Lymphocytes Relative: 31 %
Lymphs Abs: 1 10*3/uL (ref 0.7–4.0)
MCH: 29.1 pg (ref 26.0–34.0)
MCHC: 33.5 g/dL (ref 30.0–36.0)
MCV: 86.8 fL (ref 80.0–100.0)
Monocytes Absolute: 0.2 10*3/uL (ref 0.1–1.0)
Monocytes Relative: 6 %
Neutro Abs: 1.9 10*3/uL (ref 1.7–7.7)
Neutrophils Relative %: 58 %
Platelets: 135 10*3/uL — ABNORMAL LOW (ref 150–400)
RBC: 4.92 MIL/uL (ref 3.87–5.11)
RDW: 12.8 % (ref 11.5–15.5)
WBC: 3.3 10*3/uL — ABNORMAL LOW (ref 4.0–10.5)
nRBC: 0 % (ref 0.0–0.2)

## 2021-01-12 LAB — URINALYSIS, ROUTINE W REFLEX MICROSCOPIC
Bilirubin Urine: NEGATIVE
Glucose, UA: NEGATIVE mg/dL
Hgb urine dipstick: NEGATIVE
Ketones, ur: NEGATIVE mg/dL
Nitrite: NEGATIVE
Protein, ur: NEGATIVE mg/dL
Specific Gravity, Urine: 1.012 (ref 1.005–1.030)
pH: 6 (ref 5.0–8.0)

## 2021-01-12 LAB — TYPE AND SCREEN
ABO/RH(D): A POS
Antibody Screen: NEGATIVE

## 2021-01-12 LAB — SURGICAL PCR SCREEN
MRSA, PCR: NEGATIVE
Staphylococcus aureus: NEGATIVE

## 2021-01-12 NOTE — Patient Instructions (Addendum)
Your procedure is scheduled on: 01/18/21 Report to [place]. To find out your arrival time please call [phone] between 1PM - 3PM on 01/17/21.  Remember: Instructions that are not followed completely may result in serious medical risk, up to and including death, or upon the discretion of your surgeon and anesthesiologist your surgery may need to be rescheduled.     _X__ 1. Do not eat food after midnight the night before your procedure.                 No gum chewing or hard candies. You may drink clear liquids up to 2 hours                 before you are scheduled to arrive for your surgery- DO not drink clear                 liquids within 2 hours of the start of your surgery.                 Clear Liquids include:  water, apple juice without pulp, clear carbohydrate                 drink such as Clearfast or Gatorade, Black Coffee or Tea (Do not add                 anything to coffee or tea). Diabetics water only  __X__2.  On the morning of surgery brush your teeth with toothpaste and water, you                 may rinse your mouth with mouthwash if you wish.  Do not swallow any              toothpaste of mouthwash.     _X__ 3.  No Alcohol for 24 hours before or after surgery.   _X__ 4.  Do Not Smoke or use e-cigarettes For 24 Hours Prior to Your Surgery.                 Do not use any chewable tobacco products for at least 6 hours prior to                 surgery.  ____  5.  Bring all medications with you on the day of surgery if instructed.   __X__  6.  Notify your doctor if there is any change in your medical condition      (cold, fever, infections).     Do not wear jewelry, make-up, hairpins, clips or nail polish. Do not wear lotions, powders, or perfumes.  Do not shave 48 hours prior to surgery. Men may shave face and neck. Do not bring valuables to the hospital.    [facility] is not responsible for any belongings or  valuables.  Contacts, dentures/partials or body piercings may not be worn into surgery. Bring a case for your contacts, glasses or hearing aids, a denture cup will be supplied. Leave your suitcase in the car. After surgery it may be brought to your room. For patients admitted to the hospital, discharge time is determined by your treatment team.   Patients discharged the day of surgery will not be allowed to drive home.   Please read over the following fact sheets that you were given:   MRSA Information  __X__ Take these medicines the morning of surgery with A SIP OF WATER:  1. amLODipine (NORVASC) 5 MG tablet  2. atorvastatin (LIPITOR) 20 MG tablet  3. cetirizine (ZYRTEC) 10 MG tablet  4. pantoprazole (PROTONIX) 40 MG tablet  5.  6.  ____ Fleet Enema (as directed)   __X__ Use CHG Soap/SAGE wipes as directed  ____ Use inhalers on the day of surgery  ____ Stop metformin/Janumet/Farxiga 2 days prior to surgery    ____ Take 1/2 of usual insulin dose the night before surgery. No insulin the morning          of surgery.   ____ Stop Blood Thinners Coumadin/Plavix/Xarelto/Pleta/Pradaxa/Eliquis/Effient/Aspirin  on   Or contact your Surgeon, Cardiologist or Medical Doctor regarding  ability to stop your blood thinners  __X__ Stop Anti-inflammatories 7 days before surgery such as Advil, Ibuprofen, Motrin,  BC or Goodies Powder, Naprosyn, Naproxen, Aleve, Aspirin    __X__ Stop all herbal supplements, fish oil or vitamin E until after surgery.    ____ Bring C-Pap to the hospital.

## 2021-01-14 ENCOUNTER — Other Ambulatory Visit: Payer: Self-pay

## 2021-01-14 ENCOUNTER — Other Ambulatory Visit
Admission: RE | Admit: 2021-01-14 | Discharge: 2021-01-14 | Disposition: A | Discharge status: Home / Self Care | Payer: Medicare Other | Source: Ambulatory Visit | Attending: Orthopedic Surgery | Admitting: Orthopedic Surgery

## 2021-01-14 DIAGNOSIS — Z01812 Encounter for preprocedural laboratory examination: Secondary | ICD-10-CM | POA: Diagnosis present

## 2021-01-14 DIAGNOSIS — Z20822 Contact with and (suspected) exposure to covid-19: Secondary | ICD-10-CM | POA: Insufficient documentation

## 2021-01-14 LAB — URINE CULTURE: Culture: >=100000 — AB

## 2021-01-14 NOTE — Progress Notes (Signed)
  [facility] Perioperative Services: Pre-Admission/Anesthesia Testing  Abnormal Lab Notification   Date: 01/14/21  Name: EASTON SIVERTSON MRN:   [id number]  Re: Abnormal labs noted during PAT appointment   Provider(s) Notified: Hessie Knows, MD Notification mode: Routed and/or faxed via [facility]   ABNORMAL LAB VALUE(S): Lab Results  Component Value Date   COLORURINE YELLOW (A) 01/12/2021   APPEARANCEUR CLEAR (A) 01/12/2021   LABSPEC 1.012 01/12/2021   PHURINE 6.0 01/12/2021   GLUCOSEU NEGATIVE 01/12/2021   HGBUR NEGATIVE 01/12/2021   BILIRUBINUR NEGATIVE 01/12/2021   KETONESUR NEGATIVE 01/12/2021   PROTEINUR NEGATIVE 01/12/2021   UROBILINOGEN 0.2 06/16/2014   NITRITE NEGATIVE 01/12/2021   LEUKOCYTESUR LARGE (A) 01/12/2021   EPIU 6-10 01/12/2021   WBCU 0-5 01/12/2021   RBCU 0-5 01/12/2021   BACTERIA RARE (A) 01/12/2021   Lab Results  Component Value Date   CULT (A) 01/12/2021    >=100,000 COLONIES/mL GROUP B STREP(S.AGALACTIAE)ISOLATED TESTING AGAINST S. AGALACTIAE NOT ROUTINELY PERFORMED DUE TO PREDICTABILITY OF AMP/PEN/VAN SUSCEPTIBILITY. Performed at [facility], [place] [address] [place], [place] [address]    REPTSTATUS 01/14/2021 FINAL 01/12/2021    Notes:  Patient scheduled for TOTAL HIP ARTHROPLASTY ANTERIOR APPROACH (Left Hip) on 01/18/2021.    UA performed in PAT was concerning for infection.  . No leukocytosis noted on CBC . Renal function normal. Estimated Creatinine Clearance: 61.6 mL/min (by C-G formula based on SCr of 0.86 mg/dL). . Urine C&S was added to assess for pathogenically significant growth; see results.  Will forward UA, and C&S results, to attending surgeon for review and Tx as deemed appropriate. This is a Community education officer; no formal response is required.  Honor Loh, MSN, APRN, FNP-C, CEN [facility]  Peri-operative Services Nurse Practitioner Phone: [phone] Fax: [phone] 01/14/21 1:54  PM

## 2021-01-15 LAB — SARS CORONAVIRUS 2 (TAT 6-24 HRS): SARS Coronavirus 2: NEGATIVE

## 2021-01-18 ENCOUNTER — Ambulatory Visit
Admission: RE | Admit: 2021-01-18 | Discharge: 2021-01-18 | Disposition: A | Discharge status: Home / Self Care | Payer: Medicare Other | Attending: Orthopedic Surgery | Admitting: Orthopedic Surgery

## 2021-01-18 ENCOUNTER — Encounter
Admission: RE | Disposition: A | Discharge status: Home / Self Care | Payer: Self-pay | Source: Home / Self Care | Attending: Orthopedic Surgery

## 2021-01-18 ENCOUNTER — Other Ambulatory Visit: Payer: Self-pay

## 2021-01-18 ENCOUNTER — Ambulatory Visit: Payer: Medicare Other | Admitting: Urgent Care

## 2021-01-18 ENCOUNTER — Ambulatory Visit: Payer: Medicare Other

## 2021-01-18 ENCOUNTER — Encounter: Payer: Self-pay | Admitting: Orthopedic Surgery

## 2021-01-18 DIAGNOSIS — Z79899 Other long term (current) drug therapy: Secondary | ICD-10-CM | POA: Insufficient documentation

## 2021-01-18 DIAGNOSIS — Z886 Allergy status to analgesic agent status: Secondary | ICD-10-CM | POA: Insufficient documentation

## 2021-01-18 DIAGNOSIS — Z7982 Long term (current) use of aspirin: Secondary | ICD-10-CM | POA: Diagnosis not present

## 2021-01-18 DIAGNOSIS — G8918 Other acute postprocedural pain: Secondary | ICD-10-CM

## 2021-01-18 DIAGNOSIS — Z881 Allergy status to other antibiotic agents status: Secondary | ICD-10-CM | POA: Diagnosis not present

## 2021-01-18 DIAGNOSIS — M1612 Unilateral primary osteoarthritis, left hip: Secondary | ICD-10-CM | POA: Diagnosis present

## 2021-01-18 DIAGNOSIS — Z8616 Personal history of COVID-19: Secondary | ICD-10-CM | POA: Insufficient documentation

## 2021-01-18 DIAGNOSIS — Z419 Encounter for procedure for purposes other than remedying health state, unspecified: Secondary | ICD-10-CM

## 2021-01-18 DIAGNOSIS — Z96641 Presence of right artificial hip joint: Secondary | ICD-10-CM | POA: Insufficient documentation

## 2021-01-18 HISTORY — PX: TOTAL HIP ARTHROPLASTY: SHX124

## 2021-01-18 SURGERY — ARTHROPLASTY, HIP, TOTAL, ANTERIOR APPROACH
Anesthesia: Spinal | Site: Hip | Laterality: Left

## 2021-01-18 MED ORDER — BUPIVACAINE-EPINEPHRINE (PF) 0.25% -1:200000 IJ SOLN
INTRAMUSCULAR | Status: AC
  Filled 2021-01-18: qty 30

## 2021-01-18 MED ORDER — PROPOFOL 500 MG/50ML IV EMUL
INTRAVENOUS | Status: DC | PRN
  Administered 2021-01-18: 50 ug/kg/min via INTRAVENOUS

## 2021-01-18 MED ORDER — BUPIVACAINE HCL (PF) 0.5 % IJ SOLN
INTRAMUSCULAR | Status: DC | PRN
  Administered 2021-01-18: 2.7 mL

## 2021-01-18 MED ORDER — LACTATED RINGERS IV SOLN: INTRAVENOUS | Status: DC

## 2021-01-18 MED ORDER — FENTANYL CITRATE (PF) 100 MCG/2ML IJ SOLN: 25.0000 ug | INTRAMUSCULAR | Status: DC | PRN

## 2021-01-18 MED ORDER — TRANEXAMIC ACID-NACL 1000-0.7 MG/100ML-% IV SOLN
INTRAVENOUS | Status: AC
  Administered 2021-01-18: 1000 mg via INTRAVENOUS
  Filled 2021-01-18: qty 100

## 2021-01-18 MED ORDER — PROPOFOL 500 MG/50ML IV EMUL
INTRAVENOUS | Status: AC
  Filled 2021-01-18: qty 50

## 2021-01-18 MED ORDER — ALUM & MAG HYDROXIDE-SIMETH 200-200-20 MG/5ML PO SUSP: 30.0000 mL | ORAL | Status: DC | PRN

## 2021-01-18 MED ORDER — FENTANYL CITRATE (PF) 100 MCG/2ML IJ SOLN
INTRAMUSCULAR | Status: DC | PRN
  Administered 2021-01-18 (×2): 25 ug via INTRAVENOUS

## 2021-01-18 MED ORDER — PROPOFOL 10 MG/ML IV BOLUS
INTRAVENOUS | Status: AC
  Filled 2021-01-18: qty 20

## 2021-01-18 MED ORDER — TRAMADOL HCL 50 MG PO TABS: 50.0000 mg | ORAL_TABLET | Freq: Four times a day (QID) | ORAL | 2 refills | Status: DC | PRN

## 2021-01-18 MED ORDER — OXYCODONE HCL 5 MG PO TABS
5.0000 mg | ORAL_TABLET | Freq: Once | ORAL | Status: AC | PRN
Start: 2021-01-18 — End: 2021-01-18
  Administered 2021-01-18: 5 mg via ORAL

## 2021-01-18 MED ORDER — BUPIVACAINE LIPOSOME 1.3 % IJ SUSP
INTRAMUSCULAR | Status: AC
  Filled 2021-01-18: qty 20

## 2021-01-18 MED ORDER — CEFAZOLIN SODIUM-DEXTROSE 2-4 GM/100ML-% IV SOLN
INTRAVENOUS | Status: AC
  Filled 2021-01-18: qty 100

## 2021-01-18 MED ORDER — ONDANSETRON HCL 4 MG PO TABS: 4.0000 mg | ORAL_TABLET | Freq: Four times a day (QID) | ORAL | Status: DC | PRN

## 2021-01-18 MED ORDER — ACETAMINOPHEN 10 MG/ML IV SOLN
INTRAVENOUS | Status: DC | PRN
  Administered 2021-01-18: 1000 mg via INTRAVENOUS

## 2021-01-18 MED ORDER — TRAMADOL HCL 50 MG PO TABS
ORAL_TABLET | ORAL | Status: AC
  Filled 2021-01-18: qty 1

## 2021-01-18 MED ORDER — MENTHOL 3 MG MT LOZG
1.0000 | LOZENGE | OROMUCOSAL | Status: DC | PRN
  Filled 2021-01-18: qty 9

## 2021-01-18 MED ORDER — LACTATED RINGERS IV BOLUS: 250.0000 mL | Freq: Once | INTRAVENOUS | Status: DC

## 2021-01-18 MED ORDER — NEOMYCIN-POLYMYXIN B GU 40-200000 IR SOLN
Status: AC
  Filled 2021-01-18: qty 4

## 2021-01-18 MED ORDER — EPHEDRINE SULFATE 50 MG/ML IJ SOLN
INTRAMUSCULAR | Status: DC | PRN
  Administered 2021-01-18: 10 mg via INTRAVENOUS
  Administered 2021-01-18: 5 mg via INTRAVENOUS
  Administered 2021-01-18 (×2): 10 mg via INTRAVENOUS
  Administered 2021-01-18: 5 mg via INTRAVENOUS
  Administered 2021-01-18: 10 mg via INTRAVENOUS

## 2021-01-18 MED ORDER — CHLORHEXIDINE GLUCONATE 0.12 % MT SOLN: 15.0000 mL | Freq: Once | OROMUCOSAL | Status: AC

## 2021-01-18 MED ORDER — PHENOL 1.4 % MT LIQD
1.0000 | OROMUCOSAL | Status: DC | PRN
  Filled 2021-01-18: qty 177

## 2021-01-18 MED ORDER — HYDROCODONE-ACETAMINOPHEN 5-325 MG PO TABS: 1.0000 | ORAL_TABLET | ORAL | Status: DC | PRN

## 2021-01-18 MED ORDER — HYDROCODONE-ACETAMINOPHEN 7.5-325 MG PO TABS: 1.0000 | ORAL_TABLET | ORAL | 0 refills | Status: DC | PRN

## 2021-01-18 MED ORDER — HYDROCODONE-ACETAMINOPHEN 7.5-325 MG PO TABS
1.0000 | ORAL_TABLET | ORAL | Status: DC | PRN
  Administered 2021-01-18: 1 via ORAL

## 2021-01-18 MED ORDER — MORPHINE SULFATE (PF) 2 MG/ML IV SOLN: 0.5000 mg | INTRAVENOUS | Status: DC | PRN

## 2021-01-18 MED ORDER — SODIUM CHLORIDE 0.9 % IV SOLN
INTRAVENOUS | Status: DC | PRN
  Administered 2021-01-18: 20 ug/min via INTRAVENOUS

## 2021-01-18 MED ORDER — CEFAZOLIN SODIUM-DEXTROSE 2-4 GM/100ML-% IV SOLN
2.0000 g | INTRAVENOUS | Status: AC
  Administered 2021-01-18: 2 g via INTRAVENOUS

## 2021-01-18 MED ORDER — METHOCARBAMOL 1000 MG/10ML IJ SOLN
500.0000 mg | Freq: Four times a day (QID) | INTRAVENOUS | Status: DC | PRN
  Filled 2021-01-18: qty 5

## 2021-01-18 MED ORDER — ENOXAPARIN SODIUM 40 MG/0.4ML ~~LOC~~ SOLN: 40.0000 mg | SUBCUTANEOUS | Status: DC

## 2021-01-18 MED ORDER — OXYCODONE HCL 5 MG PO TABS
ORAL_TABLET | ORAL | Status: AC
  Filled 2021-01-18: qty 1

## 2021-01-18 MED ORDER — TRANEXAMIC ACID-NACL 1000-0.7 MG/100ML-% IV SOLN: 1000.0000 mg | Freq: Once | INTRAVENOUS | Status: AC

## 2021-01-18 MED ORDER — ONDANSETRON HCL 4 MG/2ML IJ SOLN: 4.0000 mg | Freq: Four times a day (QID) | INTRAMUSCULAR | Status: DC | PRN

## 2021-01-18 MED ORDER — SODIUM CHLORIDE 0.9 % IV SOLN
INTRAVENOUS | Status: DC | PRN
  Administered 2021-01-18: 60 mL

## 2021-01-18 MED ORDER — TRAMADOL HCL 50 MG PO TABS
50.0000 mg | ORAL_TABLET | Freq: Four times a day (QID) | ORAL | Status: DC
  Administered 2021-01-18: 50 mg via ORAL

## 2021-01-18 MED ORDER — BUPIVACAINE HCL (PF) 0.5 % IJ SOLN
INTRAMUSCULAR | Status: AC
  Filled 2021-01-18: qty 30

## 2021-01-18 MED ORDER — PHENYLEPHRINE HCL (PRESSORS) 10 MG/ML IV SOLN
INTRAVENOUS | Status: DC | PRN
  Administered 2021-01-18: 100 ug via INTRAVENOUS

## 2021-01-18 MED ORDER — LACTATED RINGERS IV BOLUS
500.0000 mL | Freq: Once | INTRAVENOUS | Status: AC
  Administered 2021-01-18: 500 mL via INTRAVENOUS

## 2021-01-18 MED ORDER — ENOXAPARIN SODIUM 40 MG/0.4ML ~~LOC~~ SOLN: 40.0000 mg | SUBCUTANEOUS | 0 refills | Status: DC

## 2021-01-18 MED ORDER — LIDOCAINE HCL (PF) 2 % IJ SOLN
INTRAMUSCULAR | Status: AC
  Filled 2021-01-18: qty 5

## 2021-01-18 MED ORDER — ACETAMINOPHEN 10 MG/ML IV SOLN
INTRAVENOUS | Status: AC
  Filled 2021-01-18: qty 100

## 2021-01-18 MED ORDER — CEFAZOLIN SODIUM-DEXTROSE 2-4 GM/100ML-% IV SOLN
INTRAVENOUS | Status: AC
  Administered 2021-01-18: 2 g via INTRAVENOUS
  Filled 2021-01-18: qty 100

## 2021-01-18 MED ORDER — CHLORHEXIDINE GLUCONATE 0.12 % MT SOLN
OROMUCOSAL | Status: AC
  Administered 2021-01-18: 15 mL via OROMUCOSAL
  Filled 2021-01-18: qty 15

## 2021-01-18 MED ORDER — SODIUM CHLORIDE FLUSH 0.9 % IV SOLN
INTRAVENOUS | Status: AC
  Filled 2021-01-18: qty 40

## 2021-01-18 MED ORDER — HYDROCODONE-ACETAMINOPHEN 7.5-325 MG PO TABS
ORAL_TABLET | ORAL | Status: AC
  Filled 2021-01-18: qty 1

## 2021-01-18 MED ORDER — OXYCODONE HCL 5 MG/5ML PO SOLN
5.0000 mg | Freq: Once | ORAL | Status: AC | PRN
Start: 2021-01-18 — End: 2021-01-18

## 2021-01-18 MED ORDER — ACETAMINOPHEN 325 MG PO TABS
325.0000 mg | ORAL_TABLET | Freq: Four times a day (QID) | ORAL | Status: DC | PRN
Start: 2021-01-19 — End: 2021-01-18

## 2021-01-18 MED ORDER — PROPOFOL 10 MG/ML IV BOLUS
INTRAVENOUS | Status: DC | PRN
  Administered 2021-01-18 (×2): 20 mg via INTRAVENOUS

## 2021-01-18 MED ORDER — METHOCARBAMOL 500 MG PO TABS: 500.0000 mg | ORAL_TABLET | Freq: Four times a day (QID) | ORAL | Status: DC | PRN

## 2021-01-18 MED ORDER — NEOMYCIN-POLYMYXIN B GU 40-200000 IR SOLN
Status: DC | PRN
  Administered 2021-01-18: 4 mL

## 2021-01-18 MED ORDER — SODIUM CHLORIDE 0.9 % IV SOLN: INTRAVENOUS | Status: DC

## 2021-01-18 MED ORDER — METOCLOPRAMIDE HCL 10 MG PO TABS: 5.0000 mg | ORAL_TABLET | Freq: Three times a day (TID) | ORAL | Status: DC | PRN

## 2021-01-18 MED ORDER — ONDANSETRON HCL 4 MG PO TABS: 4.0000 mg | ORAL_TABLET | Freq: Every day | ORAL | 1 refills | Status: DC | PRN

## 2021-01-18 MED ORDER — METOCLOPRAMIDE HCL 5 MG/ML IJ SOLN: 5.0000 mg | Freq: Three times a day (TID) | INTRAMUSCULAR | Status: DC | PRN

## 2021-01-18 MED ORDER — CEFAZOLIN SODIUM-DEXTROSE 2-4 GM/100ML-% IV SOLN: 2.0000 g | Freq: Four times a day (QID) | INTRAVENOUS | Status: DC

## 2021-01-18 MED ORDER — ORAL CARE MOUTH RINSE: 15.0000 mL | Freq: Once | OROMUCOSAL | Status: AC

## 2021-01-18 MED ORDER — FENTANYL CITRATE (PF) 100 MCG/2ML IJ SOLN
INTRAMUSCULAR | Status: AC
  Filled 2021-01-18: qty 2

## 2021-01-18 MED ORDER — DOCUSATE SODIUM 100 MG PO CAPS
100.0000 mg | ORAL_CAPSULE | Freq: Two times a day (BID) | ORAL | Status: DC
  Filled 2021-01-18: qty 1

## 2021-01-18 MED ORDER — BUPIVACAINE-EPINEPHRINE 0.25% -1:200000 IJ SOLN
INTRAMUSCULAR | Status: DC | PRN
  Administered 2021-01-18: 30 mL

## 2021-01-18 SURGICAL SUPPLY — 63 items
APL PRP STRL LF DISP 70% ISPRP (MISCELLANEOUS) ×1
BLADE SAGITTAL AGGR TOOTH XLG (BLADE) ×2 IMPLANT
BNDG COHESIVE 6X5 TAN STRL LF (GAUZE/BANDAGES/DRESSINGS) ×6 IMPLANT
CANISTER SUCT 1200ML W/VALVE (MISCELLANEOUS) ×2 IMPLANT
CANISTER WOUND CARE 500ML ATS (WOUND CARE) ×2 IMPLANT
CHLORAPREP W/TINT 26 (MISCELLANEOUS) ×2 IMPLANT
COVER BACK TABLE REUSABLE LG (DRAPES) ×2 IMPLANT
COVER WAND RF STERILE (DRAPES) ×2 IMPLANT
DRAPE 3/4 80X56 (DRAPES) ×6 IMPLANT
DRAPE C-ARM XRAY 36X54 (DRAPES) ×2 IMPLANT
DRAPE INCISE IOBAN 66X60 STRL (DRAPES) IMPLANT
DRAPE POUCH INSTRU U-SHP 10X18 (DRAPES) ×2 IMPLANT
DRESSING SURGICEL FIBRLLR 1X2 (HEMOSTASIS) ×2 IMPLANT
DRSG MEPILEX SACRM 8.7X9.8 (GAUZE/BANDAGES/DRESSINGS) ×2 IMPLANT
DRSG OPSITE POSTOP 4X8 (GAUZE/BANDAGES/DRESSINGS) ×4 IMPLANT
DRSG SURGICEL FIBRILLAR 1X2 (HEMOSTASIS) ×4
ELECT BLADE 6.5 EXT (BLADE) ×2 IMPLANT
ELECT REM PT RETURN 9FT ADLT (ELECTROSURGICAL) ×2
ELECTRODE REM PT RTRN 9FT ADLT (ELECTROSURGICAL) ×1 IMPLANT
GLOVE BIOGEL PI IND STRL 9 (GLOVE) ×1 IMPLANT
GLOVE BIOGEL PI INDICATOR 9 (GLOVE) ×1
GLOVE SURG SYN 9.0  PF PI (GLOVE) ×4
GLOVE SURG SYN 9.0 PF PI (GLOVE) ×2 IMPLANT
GOWN SRG 2XL LVL 4 RGLN SLV (GOWNS) ×1 IMPLANT
GOWN STRL NON-REIN 2XL LVL4 (GOWNS) ×2
GOWN STRL REUS W/ TWL LRG LVL3 (GOWN DISPOSABLE) ×1 IMPLANT
GOWN STRL REUS W/TWL LRG LVL3 (GOWN DISPOSABLE) ×2
HEMOVAC 400CC 10FR (MISCELLANEOUS) IMPLANT
HIP FEM HD M 28 (Head) ×2 IMPLANT
HOLDER FOLEY CATH W/STRAP (MISCELLANEOUS) ×2 IMPLANT
HOOD PEEL AWAY FLYTE STAYCOOL (MISCELLANEOUS) ×2 IMPLANT
IRRIGATION SURGIPHOR STRL (IV SOLUTION) IMPLANT
KIT PREVENA INCISION MGT 13 (CANNISTER) ×2 IMPLANT
LINER DUAL MOB 50MM (Liner) ×2 IMPLANT
MANIFOLD NEPTUNE II (INSTRUMENTS) ×2 IMPLANT
MAT ABSORB  FLUID 56X50 GRAY (MISCELLANEOUS) ×2
MAT ABSORB FLUID 56X50 GRAY (MISCELLANEOUS) ×1 IMPLANT
NDL SAFETY ECLIPSE 18X1.5 (NEEDLE) ×1 IMPLANT
NEEDLE HYPO 18GX1.5 SHARP (NEEDLE) ×2
NEEDLE SPNL 20GX3.5 QUINCKE YW (NEEDLE) ×4 IMPLANT
NS IRRIG 1000ML POUR BTL (IV SOLUTION) ×2 IMPLANT
PACK HIP COMPR (MISCELLANEOUS) ×2 IMPLANT
SCALPEL PROTECTED #10 DISP (BLADE) ×4 IMPLANT
SHELL ACETABULAR SZ0 50 DME (Shell) ×2 IMPLANT
SOL PREP PVP 2OZ (MISCELLANEOUS) ×2
SOLUTION PREP PVP 2OZ (MISCELLANEOUS) ×1 IMPLANT
SPONGE DRAIN TRACH 4X4 STRL 2S (GAUZE/BANDAGES/DRESSINGS) ×2 IMPLANT
STAPLER SKIN PROX 35W (STAPLE) ×2 IMPLANT
STEM FEMORAL 4 STD COLLARED (Stem) ×2 IMPLANT
STRAP SAFETY 5IN WIDE (MISCELLANEOUS) ×2 IMPLANT
SUT DVC 2 QUILL PDO  T11 36X36 (SUTURE) ×2
SUT DVC 2 QUILL PDO T11 36X36 (SUTURE) ×1 IMPLANT
SUT SILK 0 (SUTURE) ×2
SUT SILK 0 30XBRD TIE 6 (SUTURE) ×1 IMPLANT
SUT V-LOC 90 ABS DVC 3-0 CL (SUTURE) ×2 IMPLANT
SUT VIC AB 1 CT1 36 (SUTURE) ×2 IMPLANT
SYR 20ML LL LF (SYRINGE) ×2 IMPLANT
SYR 30ML LL (SYRINGE) ×2 IMPLANT
SYR 50ML LL SCALE MARK (SYRINGE) ×4 IMPLANT
SYR BULB IRRIG 60ML STRL (SYRINGE) ×2 IMPLANT
TAPE MICROFOAM 4IN (TAPE) ×2 IMPLANT
TOWEL OR 17X26 4PK STRL BLUE (TOWEL DISPOSABLE) ×2 IMPLANT
TRAY FOLEY MTR SLVR 16FR STAT (SET/KITS/TRAYS/PACK) ×2 IMPLANT

## 2021-01-18 NOTE — TOC Initial Note (Signed)
Transition of Care [facility]) - Initial/Assessment Note    Patient Details  Name: Natasha [name] MRN: [id number] Date of Birth: July 14, 1946  Transition of Care [facility]) CM/SW Contact:    Shelbie Ammons, RN Phone Number: 01/18/2021, 2:30 PM  Clinical Narrative:    RNCM reached out to [facility] with [facility] they are ready and available to service patient with PT when she comes home. Patient denies need for any DME at this time.                Expected Discharge Plan: [place] Barriers to Discharge: No Barriers Identified   Patient Goals and CMS Choice        Expected Discharge Plan and Services Expected Discharge Plan: [place]         Expected Discharge Date: 01/18/21                         HH Arranged: PT,OT [place] Agency: [facility] (formerly [employer]) Date [place]: 01/18/21 Time [place]: 1430 Representative spoke with at [place]: [place] Arrangements/Services       Do you feel safe going back to the place where you live?: Yes               Activities of Daily Living      Permission Sought/Granted                  Emotional Assessment       Orientation: : Oriented to Situation,Oriented to  Time,Oriented to Place,Oriented to Self Alcohol / Substance Use: Not Applicable Psych Involvement: No (comment)  Admission diagnosis:  Acute pain of left hip M25.552 Primary localized osteoarthritis of left hip M16.12 Patient Active Problem List   Diagnosis Date Noted  . Sinus pressure 12/23/2020  . Congestive heart failure ([place]) 12/15/2020  . Status post total hip replacement, right 05/11/2020  . Routine general medical examination at a health care facility 11/22/2015  . Special screening for malignant neoplasms, colon 11/17/2014  . Estrogen deficiency 11/17/2014  . Encounter for Medicare annual wellness exam 10/28/2013  . Prediabetes 10/28/2013  . Obesity 10/28/2013  .  Post-menopausal 09/24/2012  . Allergic rhinitis 04/26/2012  . Osteopenia 11/10/2008  . THROMBOCYTOPENIA 08/11/2008  . POSTMENOPAUSAL STATUS 07/09/2008  . HYPERCHOLESTEROLEMIA 07/16/2007  . Essential hypertension 07/16/2007  . CORONARY ARTERY DISEASE 07/16/2007  . History of CHF (congestive heart failure) 07/16/2007  . [place] DISEASE, LUMBAR SPINE 07/16/2007   PCP:  Abner Greenspan, MD Pharmacy:   [place], [place] [address] [place] [address] Phone: [phone] Fax: [phone]  [place], [place] [address] Phone: [phone] Fax: [phone]     Social Determinants of Health (SDOH) Interventions    Readmission Risk Interventions No flowsheet data found.

## 2021-01-18 NOTE — Anesthesia Preprocedure Evaluation (Signed)
Anesthesia Evaluation  Patient identified by MRN, date of birth, ID band Patient awake    Reviewed: Allergy & Precautions, H&P , NPO status , Patient's Chart, lab work & pertinent test results  History of Anesthesia Complications Negative for: history of anesthetic complications  Airway Mallampati: II  TM Distance: <3 FB Neck ROM: limited    Dental  (+) Chipped   Pulmonary neg pulmonary ROS, neg sleep apnea, neg COPD, Not current smoker,    Pulmonary exam normal        Cardiovascular hypertension, Pt. on medications (-) angina+ CAD and +CHF  (-) Past MI and (-) DOE Normal cardiovascular exam(-) dysrhythmias (-) Valvular Problems/Murmurs     Neuro/Psych  Headaches, neg Seizures negative psych ROS   GI/Hepatic Neg liver ROS, GERD  Medicated and Controlled,  Endo/Other  negative endocrine ROSneg diabetes  Renal/GU negative Renal ROS  Female genitourinary complaint: .pmh.     Musculoskeletal   Abdominal   Peds  Hematology negative hematology ROS (+)   Anesthesia Other Findings Past Medical History: No date: CAD (coronary artery disease)     Comment:  (4/05 carotid dopler- 39% stenosis) , (ECHO- mild LVH,               EF 60%) 2010: Cancer ([place])     Comment:  skin cancer right cheek No date: CHF (congestive heart failure) ([place])     Comment:  patient unaware of this diagnosis. may have been over 30              years ago. 11/2006: DDD (degenerative disc disease), lumbar     Comment:  L5-S1 bulging disk No date: DDD (degenerative disc disease), lumbar No date: GERD (gastroesophageal reflux disease) 04/30/2017: Headache syndrome     Comment:  Left occipital and ear  No date: Hyperlipidemia No date: Hypertension No date: Osteopenia No date: Spinal stenosis No date: Thrombocytopenia ([place])     Comment:  mild  Past Surgical History: No date: APPENDECTOMY 2004: BACK SURGERY     Comment:  fall at work. NO  surgery, just back injections 2017: BREAST BIOPSY; Right     Comment:  core- neg No date: CHOLECYSTECTOMY 2004: SHOULDER SURGERY     Comment:  fall.  shoulder arthroscopy.  RCR. 05/11/2020: TOTAL HIP ARTHROPLASTY; Right     Comment:  Procedure: RIGHT TOTAL HIP ARTHROPLASTY ANTERIOR               APPROACH;  Surgeon: Hessie Knows, MD;  Location: [facility]               ORS;  Service: Orthopedics;  Laterality: Right; No date: TUBAL LIGATION   Reproductive/Obstetrics negative OB ROS                             Anesthesia Physical  Anesthesia Plan  ASA: III  Anesthesia Plan: Spinal   Post-op Pain Management:    Induction:   PONV Risk Score and Plan:   Airway Management Planned: Natural Airway and Nasal Cannula  Additional Equipment:   Intra-op Plan:   Post-operative Plan:   Informed Consent: I have reviewed the patients History and Physical, chart, labs and discussed the procedure including the risks, benefits and alternatives for the proposed anesthesia with the patient or authorized representative who has indicated his/her understanding and acceptance.     Dental Advisory Given  Plan Discussed with: Anesthesiologist, CRNA and Surgeon  Anesthesia Plan Comments: (Patient reports no  bleeding problems and no anticoagulant use.  Plan for spinal with backup GA  Patient consented for risks of anesthesia including but not limited to:  - adverse reactions to medications - damage to eyes, teeth, lips or other oral mucosa - nerve damage due to positioning  - risk of bleeding, infection and or nerve damage from spinal that could lead to paralysis - risk of headache or failed spinal - damage to teeth, lips or other oral mucosa - sore throat or hoarseness - damage to heart, brain, nerves, lungs, other parts of body or loss of life  Patient voiced understanding.)        Anesthesia Quick Evaluation

## 2021-01-18 NOTE — Anesthesia Procedure Notes (Signed)
Spinal  Start time: 01/18/2021 7:20 AM End time: 01/18/2021 7:26 AM Staffing Performed: resident/CRNA  Anesthesiologist: Piscitello, Precious Haws, MD Resident/CRNA: Jerrye Noble, CRNA Preanesthetic Checklist Completed: patient identified, IV checked, site marked, risks and benefits discussed, surgical consent, monitors and equipment checked, pre-op evaluation and timeout performed Spinal Block Patient position: sitting Prep: ChloraPrep Patient monitoring: continuous pulse ox and blood pressure Approach: midline Location: L5-S1 Injection technique: single-shot Needle Needle type: Pencan  Needle gauge: 24 G Assessment Sensory level: T10 Additional Notes Negative Heme, Negative paresthesia.

## 2021-01-18 NOTE — OR Nursing (Signed)
OT and PT evaluation and treatment complete.  Ready for DC home.

## 2021-01-18 NOTE — Evaluation (Signed)
Occupational Therapy Evaluation Patient Details Name: Natasha [name] MRN: [id number] DOB: 1946-11-25 Today's Date: 01/18/2021    History of Present Illness Pt is a 75 yo female diagnosed with OA of the L hip and is s/p elective anterior L THA.  PMH includes: R THA, HTN, CAD, CHF, DDD, and lumbar spinal stenosis.   Clinical Impression   OT evaluation initiated and completed. Pt seated in recliner chair with no c/o pain this session and agreeable to OT intervention. Pt lives at home with husband and will also have 4 children in to provide assistance/supervision as needed. OT educated pt on use of AE to increase I with LB self care. Pt does have reacher at home and OT demonstrated how to utilize to thread clothing onto L LE to ease with dressing. Pt plans on performing sink bathing for several weeks and OT recommended having a daughter present when she initially starts to shower with recommendation of 3 in 1 for shower seat. 3 in 1 also recommended to decrease fall risk at night with urgency for urination. Pt verbalized understanding and has all needed equipment at this time. Pt and family feel very confident about returning home. OT to SIGN OFF at this time with pt needing no further skilled OT intervention at this time.     Follow Up Recommendations  No OT follow up;Supervision - Intermittent    Equipment Recommendations  None recommended by OT       Precautions / Restrictions Precautions Precautions: Fall;Anterior Hip Precaution Booklet Issued: Yes (comment) Restrictions Weight Bearing Restrictions: Yes LLE Weight Bearing: Weight bearing as tolerated      Mobility Bed Mobility Overal bed mobility: Modified Independent             General bed mobility comments: seated in recliner chair end/beginning of session    Transfers Overall transfer level: Needs assistance Equipment used: Rolling walker (2 wheeled) Transfers: Sit to/from Stand Sit to Stand: From elevated surface;Min  guard         General transfer comment: Min to mod verbal cues for hand placement and increased trunk flexion with fair eccentric and concentric control and stability    Balance Overall balance assessment: Needs assistance Sitting-balance support: No upper extremity supported Sitting balance-Leahy Scale: Good     Standing balance support: Bilateral upper extremity supported;During functional activity Standing balance-Leahy Scale: Good Standing balance comment: Min lean on the RW for support                           ADL either performed or assessed with clinical judgement   ADL Overall ADL's : Needs assistance/impaired Eating/Feeding: Modified independent           Lower Body Bathing: Minimal assistance;Sit to/from stand       Lower Body Dressing: Minimal assistance;Sit to/from stand                 General ADL Comments: Anticipate min A to thread clothing onto L LE. OT educated pt and family member on dressing operative LE first and use of LH reacher to increase I with LB self care tasks.     Vision Baseline Vision/History: Wears glasses Patient Visual Report: No change from baseline              Pertinent Vitals/Pain Pain Assessment: No/denies pain Pain Score: 4  Pain Location: L hip Pain Descriptors / Indicators: Sore Pain Intervention(s): Premedicated before session;Monitored during session  Hand Dominance Right   Extremity/Trunk Assessment Upper Extremity Assessment Upper Extremity Assessment: Overall WFL for tasks assessed   Lower Extremity Assessment Lower Extremity Assessment: Defer to PT evaluation LLE Deficits / Details: BLE ankle strength, AROM, and sensation to light touch WNL LLE: Unable to fully assess due to pain LLE Sensation: WNL LLE Coordination: WNL       Communication Communication Communication: No difficulties   Cognition Arousal/Alertness: Awake/alert Behavior During Therapy: WFL for tasks  assessed/performed Overall Cognitive Status: Within Functional Limits for tasks assessed                                        Exercises Total Joint Exercises Ankle Circles/Pumps: AROM;Strengthening;Both;10 reps Long Arc Quad: AROM;Strengthening;Both;10 reps;5 reps Knee Flexion: AROM;Strengthening;Both;5 reps;10 reps Marching in Standing: AROM;Strengthening;Both;5 reps;Standing Other Exercises Other Exercises: HEP education per handout Other Exercises: Anterior hip precaution education/review Other Exercises: Car transfer sequencing verbal education with pt and daughter Other Exercises: Step-to ambulation sequencing education provided to pt and daughter to use PRN for pain control        Home Living Family/patient expects to be discharged to:: Private residence Living Arrangements: Spouse/significant other Available Help at Discharge: Family;Available 24 hours/day Type of Home: House Home Access: Stairs to enter [employer] of Steps: 1+1 small threshold steps Entrance Stairs-Rails: None Home Layout: One level     Bathroom Shower/Tub: Teacher, early years/pre: Handicapped height Bathroom Accessibility: Yes   Home Equipment: Environmental consultant - 4 wheels;Walker - 2 wheels;Bedside commode   Additional Comments: Elevated toilet has armrests; pt has 4 daughters that will rotate providing 24/7 supervision      Prior Functioning/Environment Level of Independence: Independent        Comments: Ind amb limited community distances (secondary to L hip pain) without an AD, no fall history, Ind with ADLs        OT Problem List:        OT Treatment/Interventions:      OT Goals(Current goals can be found in the care plan section) Acute Rehab OT Goals Patient Stated Goal: to walk better OT Goal Formulation: With patient/family Time For Goal Achievement: 02/01/21 Potential to Achieve Goals: Good  OT Frequency:      AM-PAC OT "6 Clicks" Daily  Activity     Outcome Measure Help from another person eating meals?: None Help from another person taking care of personal grooming?: None Help from another person toileting, which includes using toliet, bedpan, or urinal?: A Little Help from another person bathing (including washing, rinsing, drying)?: A Little Help from another person to put on and taking off regular upper body clothing?: None Help from another person to put on and taking off regular lower body clothing?: A Little 6 Click Score: 21   End of Session Equipment Utilized During Treatment: Rolling walker Nurse Communication: Mobility status  Activity Tolerance: Patient tolerated treatment well Patient left: in chair;with nursing/sitter in room;with family/visitor present                   Time: 6948-5462 OT Time Calculation (min): 11 min Charges:  OT General Charges $OT Visit: 1 Visit OT Evaluation $OT Eval Low Complexity: 1 Low  Darleen Crocker, MS, OTR/L , CBIS ascom [phone]  01/18/21, 3:37 PM

## 2021-01-18 NOTE — Op Note (Signed)
01/18/2021  8:57 AM  PATIENT:  Natasha [name]  75 y.o. female  PRE-OPERATIVE DIAGNOSIS:  Acute pain of left hip M25.552 Primary localized osteoarthritis of left hip M16.12  POST-OPERATIVE DIAGNOSIS:  Acute pain of left hip M25.552 Primary localized osteoarthritis of left hip M16.12  PROCEDURE:  Procedure(s): TOTAL HIP ARTHROPLASTY ANTERIOR APPROACH (Left)  SURGEON: Laurene Footman, MD  ASSISTANTS: None  ANESTHESIA:   spinal  EBL:  Total I/O In: 200 [I.V.:100; IV Piggyback:100] Out: 900 [Urine:600; Blood:300]  BLOOD ADMINISTERED:none  DRAINS: Incisional wound VAC   LOCAL MEDICATIONS USED:  MARCAINE    and OTHER Exparel  SPECIMEN:  Source of Specimen:  Left femoral head  DISPOSITION OF SPECIMEN:  PATHOLOGY  COUNTS:  YES  TOURNIQUET:  * No tourniquets in log *  IMPLANTS: Medacta AMIS 4 standard stem, 50 mm Mpact TM cup and liner with metal M 28 mm head  DICTATION: .Dragon Dictation   The patient was brought to the operating room and after spinal anesthesia was obtained patient was placed on the operative table with the ipsilateral foot into the Medacta attachment, contralateral leg on a well-padded table. C-arm was brought in and preop template x-ray taken. After prepping and draping in usual sterile fashion appropriate patient identification and timeout procedures were completed. Anterior approach to the hip was obtained and centered over the greater trochanter and TFL muscle. The subcutaneous tissue was incised hemostasis being achieved by electrocautery. TFL fascia was incised and the muscle retracted laterally deep retractor placed. The lateral femoral circumflex vessels were identified and ligated. The anterior capsule was exposed and a capsulotomy performed. The neck was identified and a femoral neck cut carried out with a saw. The head was removed without difficulty and showed sclerotic femoral head and acetabulum. Reaming was carried out to 50 mm and a 50 mm cup trial gave  appropriate tightness to the acetabular component a 50 DM cup was impacted into position. The leg was then externally rotated and ischiofemoral and pubofemoral releases carried out. The femur was sequentially broached to a size 4, size 4 standard with S head trials were placed and the final components chosen. The 4 standard stem was inserted along with a metal M 28 mm head and 50 mm liner. The hip was reduced and was stable the wound was thoroughly irrigated with fibrillar placed along the posterior capsule and medial neck. The deep fascia ws closed using a heavy Quill after infiltration of 30 cc of quarter percent Sensorcaine with epinephrine. Next with Exparel throughout the case, 3-0 V-loc to close the skin with skin staples.  Incisional wound VAC applied and patient was sent to recovery in stable condition.   PLAN OF CARE: Discharge to home after PACU

## 2021-01-18 NOTE — Anesthesia Postprocedure Evaluation (Signed)
Anesthesia Post Note  Patient: Natasha [name]  Procedure(s) Performed: TOTAL HIP ARTHROPLASTY ANTERIOR APPROACH (Left Hip)  Patient location during evaluation: PACU Anesthesia Type: Spinal Level of consciousness: awake and alert Pain management: pain level controlled Vital Signs Assessment: post-procedure vital signs reviewed and stable Respiratory status: spontaneous breathing, nonlabored ventilation, respiratory function stable and patient connected to nasal cannula oxygen Cardiovascular status: blood pressure returned to baseline and stable Postop Assessment: no apparent nausea or vomiting Anesthetic complications: no   No complications documented.   Last Vitals:  Vitals:   01/18/21 1307 01/18/21 1348  BP: 133/64 (!) 133/58  Pulse: 79 72  Resp: 16 16  Temp: (!) 36.3 C (!) 36.1 C  SpO2: 98% 100%    Last Pain:  Vitals:   01/18/21 1348  TempSrc: Temporal  PainSc: [place]

## 2021-01-18 NOTE — Transfer of Care (Signed)
Immediate Anesthesia Transfer of Care Note  Patient: Natasha [name]  Procedure(s) Performed: TOTAL HIP ARTHROPLASTY ANTERIOR APPROACH (Left Hip)  Patient Location: PACU  Anesthesia Type:Spinal  Level of Consciousness: awake, alert  and oriented  Airway & Oxygen Therapy: Patient Spontanous Breathing and Patient connected to face mask oxygen  Post-op Assessment: Report given to RN and Post -op Vital signs reviewed and stable  Post vital signs: Reviewed and stable  Last Vitals:  Vitals Value Taken Time  BP 121/61 01/18/21 0857  Temp    Pulse 74 01/18/21 0859  Resp 17 01/18/21 0859  SpO2 99 % 01/18/21 0859  Vitals shown include unvalidated device data.  Last Pain:  Vitals:   01/18/21 0624  TempSrc: Temporal  PainSc: 4          Complications: No complications documented.

## 2021-01-18 NOTE — Evaluation (Signed)
Physical Therapy Evaluation Patient Details Name: Natasha [name] MRN: [id number] DOB: 1946/01/10 Today's Date: 01/18/2021   History of Present Illness  Pt is a 76 yo female diagnosed with OA of the L hip and is s/p elective anterior L THA.  PMH includes: R THA, HTN, CAD, CHF, DDD, and lumbar spinal stenosis.    Clinical Impression  Pt was pleasant and motivated to participate during the session and overall performed very well. Pt did not require physical assistance with any functional task but did require some extra time and effort and/or cues for proper sequencing.  Pt was steady with both transfers and gait and demonstrated good control ascending and descending steps with daughter present for training.  Pt reported no adverse symptoms during the session with HR and SpO2 WNL on room air.  Pt will benefit from HHPT services upon discharge to safely address deficits listed in patient problem list for decreased caregiver assistance and eventual return to PLOF.     Follow Up Recommendations Home health PT;Supervision for mobility/OOB    Equipment Recommendations  None recommended by PT    Recommendations for Other Services       Precautions / Restrictions Precautions Precautions: Fall;Anterior Hip Precaution Booklet Issued: Yes (comment) Restrictions Weight Bearing Restrictions: Yes LLE Weight Bearing: Weight bearing as tolerated      Mobility  Bed Mobility Overal bed mobility: Modified Independent             General bed mobility comments: Extra time and effort and use of the bed rail required    Transfers Overall transfer level: Needs assistance Equipment used: Rolling walker (2 wheeled) Transfers: Sit to/from Stand Sit to Stand: From elevated surface;Min guard         General transfer comment: Min to mod verbal cues for hand placement and increased trunk flexion with fair eccentric and concentric control and stability  Ambulation/Gait Ambulation/Gait assistance:  Min guard Gait Distance (Feet): 30 Feet x 1, 20 Feet x 1 Assistive device: Rolling walker (2 wheeled) Gait Pattern/deviations: Step-to pattern;Step-through pattern;Decreased step length - right;Decreased step length - left;Decreased stance time - left;Antalgic Gait velocity: decreased   General Gait Details: Mildly antalgic gait pattern on the LLE but with gait pattern quickly progressing from step-to to step-through pattern with no LOB or adverse symptoms other than mod L hip pain; SpO2 and HR WNL  Stairs Stairs: Yes Stairs assistance: Min guard Stair Management: Backwards;Forwards;Step to pattern;With walker Number of Stairs: 1 General stair comments: Pt able to ascend/descend 1 step x 2 with good control and stability and good carryover of proper sequencing; pt's daughter present and participated in stair training  Wheelchair Mobility    Modified Rankin (Stroke Patients Only)       Balance Overall balance assessment: Needs assistance Sitting-balance support: No upper extremity supported Sitting balance-Leahy Scale: Good     Standing balance support: Bilateral upper extremity supported;During functional activity Standing balance-Leahy Scale: Good Standing balance comment: Min lean on the RW for support                             Pertinent Vitals/Pain Pain Assessment: 0-10 Pain Score: 4  Pain Location: L hip Pain Descriptors / Indicators: Sore Pain Intervention(s): Premedicated before session;Monitored during session    Home Living Family/patient expects to be discharged to:: Private residence Living Arrangements: Spouse/significant other Available Help at Discharge: Family;Available 24 hours/day Type of Home: House Home Access: Stairs to enter  Entrance Stairs-Rails: None Entrance Stairs-Number of Steps: 1+1 small threshold steps Home Layout: One level Home Equipment: Walker - 4 wheels;Walker - 2 wheels Additional Comments: Elevated toilet has armrests;  pt has 4 daughters that will rotate providing 24/7 supervision    Prior Function Level of Independence: Independent         Comments: Ind amb limited community distances (secondary to L hip pain) without an AD, no fall history, Ind with ADLs     Hand Dominance   Dominant Hand: Right    Extremity/Trunk Assessment   Upper Extremity Assessment Upper Extremity Assessment: Overall WFL for tasks assessed    Lower Extremity Assessment Lower Extremity Assessment: Generalized weakness;LLE deficits/detail LLE Deficits / Details: BLE ankle strength, AROM, and sensation to light touch WNL LLE: Unable to fully assess due to pain LLE Sensation: WNL LLE Coordination: WNL       Communication   Communication: No difficulties  Cognition Arousal/Alertness: Awake/alert Behavior During Therapy: WFL for tasks assessed/performed Overall Cognitive Status: Within Functional Limits for tasks assessed                                        General Comments      Exercises Total Joint Exercises Ankle Circles/Pumps: AROM;Strengthening;Both;10 reps Long Arc Quad: AROM;Strengthening;Both;10 reps;5 reps Knee Flexion: AROM;Strengthening;Both;5 reps;10 reps Marching in Standing: AROM;Strengthening;Both;5 reps;Standing Other Exercises Other Exercises: HEP education per handout Other Exercises: Anterior hip precaution education/review Other Exercises: Car transfer sequencing verbal education with pt and daughter Other Exercises: Step-to ambulation sequencing education provided to pt and daughter to use PRN for pain control   Assessment/Plan    PT Assessment Patient needs continued PT services  PT Problem List Decreased strength;Decreased activity tolerance;Decreased balance;Decreased mobility;Decreased knowledge of use of DME;Pain       PT Treatment Interventions DME instruction;Gait training;Stair training;Functional mobility training;Therapeutic activities;Therapeutic  exercise;Balance training;Patient/family education    PT Goals (Current goals can be found in the Care Plan section)  Acute Rehab PT Goals Patient Stated Goal: To start walking and have increased quality of life PT Goal Formulation: With patient Time For Goal Achievement: 01/31/21 Potential to Achieve Goals: Good    Frequency BID   Barriers to discharge        Co-evaluation               AM-PAC PT "6 Clicks" Mobility  Outcome Measure Help needed turning from your back to your side while in a flat bed without using bedrails?: A Little Help needed moving from lying on your back to sitting on the side of a flat bed without using bedrails?: A Little Help needed moving to and from a bed to a chair (including a wheelchair)?: A Little Help needed standing up from a chair using your arms (e.g., wheelchair or bedside chair)?: A Little Help needed to walk in hospital room?: A Little Help needed climbing 3-5 steps with a railing? : A Little 6 Click Score: 18    End of Session Equipment Utilized During Treatment: Gait belt Activity Tolerance: Patient tolerated treatment well Patient left: in chair;with family/visitor present;with call bell/phone within reach;with nursing/sitter in room Nurse Communication: Mobility status PT Visit Diagnosis: Other abnormalities of gait and mobility (R26.89);Pain;Muscle weakness (generalized) (M62.81) Pain - Right/Left: Left Pain - part of body: Hip    Time: 1410-1454 PT Time Calculation (min) (ACUTE ONLY): 44 min   Charges:  PT Evaluation $PT Eval Moderate Complexity: 1 Mod PT Treatments $Gait Training: 8-22 mins $Therapeutic Activity: 8-22 mins        D. Royetta Asal PT, DPT 01/18/21, 3:27 PM

## 2021-01-18 NOTE — H&P (Signed)
Chief Complaint  Patient presents with  . Pre-op Exam  H&P    History of the Present Illness: Natasha [name] is a 75 y.o. female here today for history and physical for left total hip arthroplasty with Dr. Hessie Knows on 01/18/2021. She has had several months of increasing pain in the left hip with x-ray showing severe degenerative arthritis. She has been having to use ambulatory aids to get around her home. Despite losing weight and taking over-the-counter medications she is not seeing any improvement in her symptoms. The patient states on 10/17/2020 she could walk around the block normally, but since that time she has not been able to put weight on her left hip. She went back to using her walker and cane. Pain is interfering with her quality of life and activities day living. Pain is located in her groin thigh and buttocks on the left side and will radiate into the thigh. No back pain numbness tingling radicular symptoms  The patient has had a right total hip arthroplasty preformed on 05/11/2020. She states her right hip is doing well.  The patient states she had COVID in 10/2019. The patient states she has lost 40 pounds since the pandemic started due to lifestyle changes.   I have reviewed past medical, surgical, social and family history, and allergies as documented in the EMR.  Past Medical History: Past Medical History:  Diagnosis Date  . Contusion, knee fell 05/15/2003  . Degenerative arthritis of hip  right hip  . Elevated cholesterol  . Foreign body of finger splinter removed 03/25/2002  . History of chickenpox  . Hypertension  . 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 Shoulder injury [place] 05/15/2003  Impingement type symptomatology right shoulder with possible partial cuff tear.  Marland Kitchen Spinal stenosis  . Trochanteric bursitis of right hip   Past Surgical History: Past Surgical History:  Procedure Laterality Date  . ARTHROPLASTY HIP TOTAL Right 05/11/2020   Dr. Rudene Christians  . CHOLECYSTECTOMY 1977  . D&C  . Removal of foreign body 03/25/2002  splinter, finger  . Right shoulder rotator cuff surgery 2011  . Shoulder injection 09/22/2003  . Trochanteric hip injection 04/10/2014 by Dr. Rudene Christians  . TUBAL LIGATION   Past Family History: Family History  Problem Relation Age of Onset  . Diabetes Sister  . Liver cancer Other  . Heart disease Mother   Medications: Current Outpatient Medications Ordered in Epic  Medication Sig Dispense Refill  . acetaminophen (TYLENOL) 500 MG tablet Take 2 tablets by mouth as needed  . amLODIPine (NORVASC) 5 MG tablet Take 5 mg by mouth once daily  . aspirin (ASPIRIN LOW DOSE) 81 MG EC tablet Take 81 mg by mouth once daily.  Marland Kitchen atorvastatin (LIPITOR) 20 MG tablet Take 1 tablet by mouth once daily.  . cholecalciferol (VITAMIN D3) 1,000 unit capsule Take 1,000 Units by mouth once daily  . losartan-hydrochlorothiazide (HYZAAR) 100-12.5 mg tablet Take 1 tablet by mouth once daily.  . pantoprazole (PROTONIX) 40 MG DR tablet Take 40 mg by mouth once daily.   No current Epic-ordered facility-administered medications on file.   Allergies: Allergies  Allergen Reactions  . Aleve [Naproxen Sodium] Other (See Comments)  GI intolerance  . Clindamycin Itching and Rash  . Lincomycin Itching and Rash    Body mass index is 30.65 kg/m.  Review of Systems: A comprehensive 14 point ROS was performed, reviewed, and the pertinent orthopaedic findings are documented in the HPI.  Vitals:  01/14/21 1041  BP:  128/80    General Physical Examination:   General:  Well developed, well nourished, no apparent distress, normal affect, slow antalgic gait. No assistive devices  HEENT: Head normocephalic, atraumatic, PERRL.   Abdomen: Soft, non tender, non distended, Bowel sounds present.  Heart: Examination of the heart reveals regular, rate, and rhythm. There is no murmur noted on ascultation. There is a normal apical  pulse.  Lungs: Lungs are clear to auscultation. There is no wheeze, rhonchi, or crackles. There is normal expansion of bilateral chest walls.   Musculoskeletal Examination:  On exam, left hip has 10 degrees internal rotation and 20 degrees external. Right hip has 25 degrees internal rotation and 25 degrees external with mild pain. Antalgic gait using a cane.  Radiographs:  AP pelvis and lateral x-rays of the left hip were reviewed by me today. These show prior right total hip in good position. Left hip shows severe osteoarthritis, subchondral sclerosis, subchondral cyst formation in the head with large osteophytes at the inferior head.  X-ray Impression Advanced left hip osteoarthritis with stable right total hip in place.  Assessment: ICD-10-CM  1. Primary localized osteoarthritis of left hip M16.12   Plan: 1. Risks, benefits, complications of a left total hip arthroplasty have been discussed with the patient. Patient has agreed and consented the procedure with Dr. Hessie Knows on 01/18/2021.   Electronically signed by Feliberto Gottron, PA at 01/14/2021 11:16 AM EST    Reviewed  H+P. No changes noted.

## 2021-01-18 NOTE — Discharge Instructions (Addendum)
Total Hip Replacement, Anterior, Care After This sheet gives you information about how to care for yourself after your procedure. Your health care provider may also give you more specific instructions. If you have problems or questions, contact your health care provider. What can I expect after the procedure? After the procedure, it is common to have:  Redness, pain, and swelling at your incision area.  Stiffness.  Discomfort.  A small amount of blood or clear fluid coming from your incision. Follow these instructions at home: Medicines  Take over-the-counter and prescription medicines only as told by your health care provider.  If you were prescribed a blood thinner (anticoagulant) to help prevent blood clots, take it as told by your health care provider.  Ask your health care provider if the medicine prescribed to you: ? Requires you to avoid driving or using machinery. ? Can cause constipation. You may need to take these actions to prevent or treat constipation:  Drink enough fluid to keep your urine pale yellow.  Take over-the-counter or prescription medicines.  Eat foods that are high in fiber, such as beans, whole grains, and fresh fruits and vegetables.  Limit foods that are high in fat and processed sugars, such as fried or sweet foods. Incision care  Follow instructions from your health care provider about how to take care of your incision. Make sure you: ? Wash your hands with soap and water for at least 20 seconds before and after you change your bandage (dressing). If soap and water are not available, use hand sanitizer. ? Change your dressing as told by your health care provider. ? Leave stitches (sutures), staples, skin glue, or adhesive strips in place. These skin closures may need to stay in place for 2 weeks or longer. If adhesive strip edges start to loosen and curl up, you may trim the loose edges. Do not remove adhesive strips completely unless your health care  provider tells you to do that.  Do not take baths, swim, or use a hot tub until your health care provider approves.  Check your incision area every day for signs of infection. Check for: ? More redness, swelling, or pain. ? More fluid or blood. ? Warmth. ? Pus or a bad smell.   Managing pain, stiffness, and swelling  If directed, put ice on the affected area. To do this: ? Put ice in a plastic bag. ? Place a towel between your skin and the bag. ? Leave the ice on for 20 minutes, 2-3 times a day. ? Remove the ice if your skin turns bright red. This is very important. If you cannot feel pain, heat, or cold, you have a greater risk of damage to the area.  Move your toes often to reduce stiffness and swelling.  Raise (elevate) your leg above the level of your heart while you are lying down.   Activity  Ask your health care provider what activities are safe for you.  Avoid sitting for a long time without moving. Get up to take short walks every 1-2 hours. This is important to improve blood flow and breathing. Ask for help if you feel weak or unsteady.  Do exercises as told by your health care provider.  Do not use your legs to support (bear) your body weight until your health care provider says that you can. Follow instructions about how much weight you may safely support on your affected leg (weight-bearing restrictions).  Use a walker, crutches, or a cane as told by  your health care provider.  Return to your normal activities as told by your health care provider. Movement restrictions  To help prevent hip dislocation, follow instructions about movement restrictions as told. For example: ? Do not cross your legs at the knees. To remind yourself about this, you may keep a pillow between your legs while lying in bed. ? Do not bend at the hip and waist or bend farther than 90 degrees of hip flexion. To avoid bending this far:  Do not bring your knees higher than your hips.  Do not  pick up something from the floor while sitting in a chair.  Avoid sitting in low chairs.  Use a raised toilet seat.  When standing up from a seated position, keep the injured leg out in front of you. ? Avoid twisting at your waist and reaching across your body to the side of the affected leg. ? Avoid rotating your toes inward.  When getting into a car: 1. Raise the seat as high as possible, move the seat as far back as it will go, and recline the upper part of the seat slightly. 2. Sit down into the seat with your injured leg extended out of the car. 3. Scoot back into the seat as you move the lower half of your body into the car. Try to avoid bumping your foot or leg as you bring it into the car.   Safety  To help prevent falls, keep floors clear of objects you may trip over, and place items that you may need within easy reach.  Wear an apron or tool belt with pockets for carrying objects. This leaves your hands free to help with your balance. General instructions  Ask your health care provider when it is safe to drive.  Wear compression stockings as told by your health care provider. These stockings help to prevent blood clots and reduce swelling in your legs.  Keep doing breathing exercises as told. These help prevent lung infection.  Do not use any products that contain nicotine or tobacco, such as cigarettes, e-cigarettes, and chewing tobacco. These can delay healing after surgery. If you need help quitting, ask your health care provider.  Tell your health care provider if you plan to have dental work. Also: ? Tell your dentist about your joint replacement. ? Ask your health care provider if there are any special instructions you need to follow before having dental care and routine cleanings.  Keep all follow-up visits. This is important. Contact a health care provider if:  You have a fever or chills.  You have a cough.  Your medicine is not controlling your pain.  You  have more redness, swelling, or pain around your incision.  You have more fluid or blood coming from your incision.  Your incision feels warm to the touch.  You have pus or a bad smell coming from your incision. Get help right away if:  You have severe pain.  You have shortness of breath or trouble breathing.  You have chest pain.  You have redness, swelling, pain, or warmth in your calf or leg.  Your incision breaks open after suturesor staples are removed. These symptoms may represent a serious problem that is an emergency. Do not wait to see if the symptoms will go away. Get medical help right away. Call your local emergency services (911 in the [place] Do not drive yourself to the hospital. Summary  Do not use your legs to support your body weight until  your health care provider says that you can. Use a walker, crutches, or a cane as told.  To help prevent hip dislocation, follow instructions about movement restrictions as told.  Keep all follow-up visits. This is important. This information is not intended to replace advice given to you by your health care provider. Make sure you discuss any questions you have with your health care provider. Document Revised: 04/22/2020 Document Reviewed: 04/22/2020 Elsevier Patient Education  2021 [place]   1) The drugs that you were given will stay in your system until tomorrow so for the next 24 hours you should not:  A) Drive an automobile B) Make any legal decisions C) Drink any alcoholic beverage   2) You may resume regular meals tomorrow.  Today it is better to start with liquids and gradually work up to solid foods.  You may eat anything you prefer, but it is better to start with liquids, then soup and crackers, and gradually work up to solid foods.   3) Please notify your doctor immediately if you have any unusual bleeding, trouble breathing, redness and pain at the surgery  site, drainage, fever, or pain not relieved by medication.    4) Additional Instructions: Do not remove green Exparel band for 4 days  Please contact your physician with any problems or Same Day Surgery at [phone], Monday through Friday 6 am to 4 pm, or [place] [facility] number at [phone].

## 2021-01-19 ENCOUNTER — Encounter: Payer: Self-pay | Admitting: Orthopedic Surgery

## 2021-01-19 LAB — SURGICAL PATHOLOGY

## 2021-03-16 ENCOUNTER — Other Ambulatory Visit (HOSPITAL_COMMUNITY): Payer: Self-pay | Admitting: Orthopedic Surgery

## 2021-03-16 ENCOUNTER — Other Ambulatory Visit: Payer: Self-pay | Admitting: Orthopedic Surgery

## 2021-03-16 DIAGNOSIS — Z96642 Presence of left artificial hip joint: Secondary | ICD-10-CM

## 2021-03-16 DIAGNOSIS — M25552 Pain in left hip: Secondary | ICD-10-CM

## 2021-03-25 ENCOUNTER — Encounter
Admission: RE | Admit: 2021-03-25 | Discharge: 2021-03-25 | Disposition: A | Discharge status: Home / Self Care | Payer: Medicare Other | Source: Ambulatory Visit | Attending: Orthopedic Surgery | Admitting: Orthopedic Surgery

## 2021-03-25 ENCOUNTER — Other Ambulatory Visit: Payer: Self-pay

## 2021-03-25 DIAGNOSIS — M25552 Pain in left hip: Secondary | ICD-10-CM | POA: Insufficient documentation

## 2021-03-25 DIAGNOSIS — Z96642 Presence of left artificial hip joint: Secondary | ICD-10-CM

## 2021-03-25 MED ORDER — TECHNETIUM TC 99M MEDRONATE IV KIT
20.0000 | PACK | Freq: Once | INTRAVENOUS | Status: AC | PRN
  Administered 2021-03-25: 21.88 via INTRAVENOUS

## 2021-04-05 ENCOUNTER — Other Ambulatory Visit: Payer: Self-pay | Admitting: Family Medicine

## 2021-04-05 DIAGNOSIS — Z1231 Encounter for screening mammogram for malignant neoplasm of breast: Secondary | ICD-10-CM

## 2021-04-08 ENCOUNTER — Other Ambulatory Visit: Payer: Self-pay | Admitting: Orthopedic Surgery

## 2021-04-08 DIAGNOSIS — M5441 Lumbago with sciatica, right side: Secondary | ICD-10-CM

## 2021-04-12 ENCOUNTER — Other Ambulatory Visit: Payer: Self-pay

## 2021-04-12 ENCOUNTER — Ambulatory Visit
Admission: RE | Admit: 2021-04-12 | Discharge: 2021-04-12 | Disposition: A | Discharge status: Home / Self Care | Payer: Medicare Other | Source: Ambulatory Visit | Attending: Orthopedic Surgery | Admitting: Orthopedic Surgery

## 2021-04-12 DIAGNOSIS — M5442 Lumbago with sciatica, left side: Secondary | ICD-10-CM | POA: Insufficient documentation

## 2021-04-12 DIAGNOSIS — M5441 Lumbago with sciatica, right side: Secondary | ICD-10-CM | POA: Diagnosis present

## 2021-04-13 ENCOUNTER — Ambulatory Visit
Admission: RE | Admit: 2021-04-13 | Discharge: 2021-04-13 | Disposition: A | Discharge status: Home / Self Care | Payer: Medicare Other | Source: Ambulatory Visit | Attending: Family Medicine | Admitting: Family Medicine

## 2021-04-13 DIAGNOSIS — E2839 Other primary ovarian failure: Secondary | ICD-10-CM | POA: Diagnosis present

## 2021-04-13 DIAGNOSIS — Z1231 Encounter for screening mammogram for malignant neoplasm of breast: Secondary | ICD-10-CM

## 2021-06-27 IMAGING — XA DG HIP (WITH PELVIS) OPERATIVE*L*
2 series · 2 of 2 positions shown · non-contrast
Comparison: None.

CLINICAL DATA: Left hip replaced

EXAM:
OPERATIVE LEFT HIP (WITH PELVIS IF PERFORMED) 1 VIEW
TECHNIQUE: Fluoroscopic spot image(s) were submitted for interpretation
post-operatively.

[Series 6: ortho standard · 1 of 1 slices shown (1 of 2)]
[im 1/1]
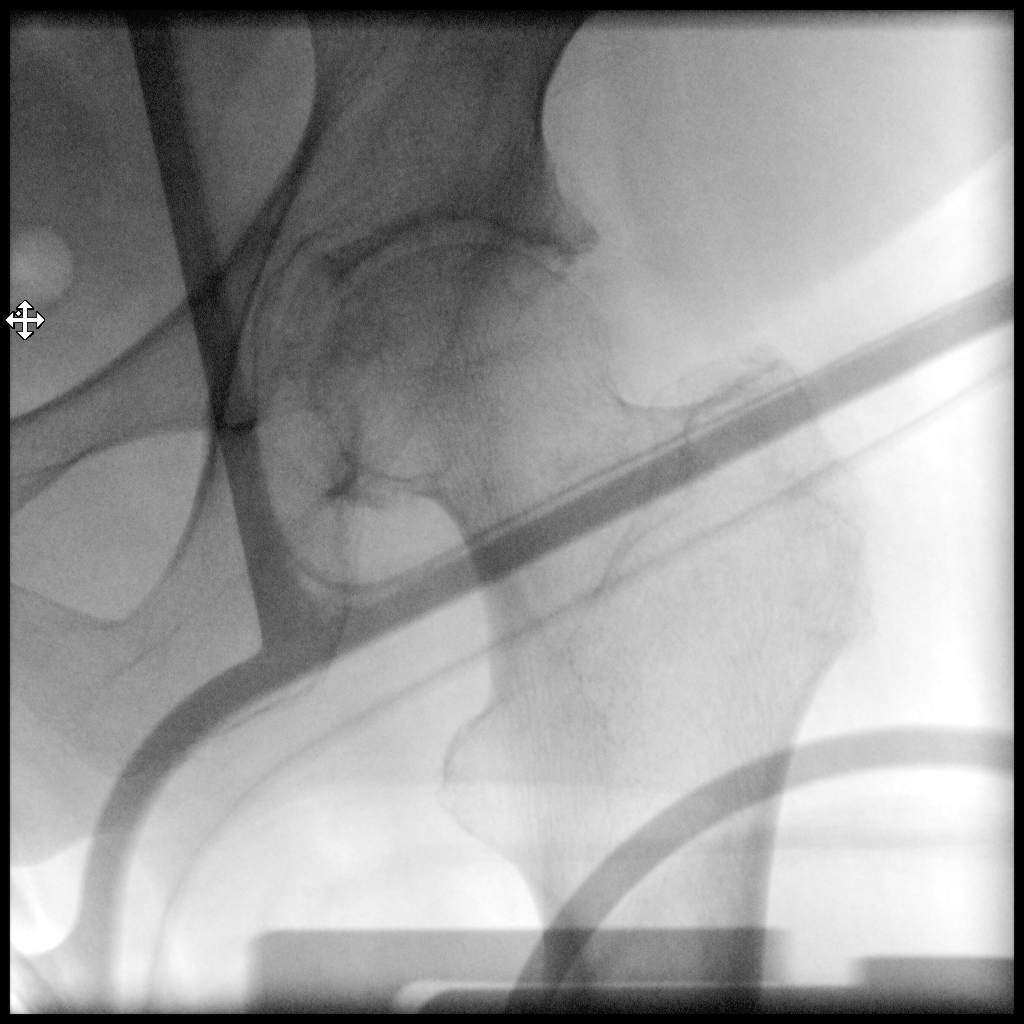

[Series 11: ortho standard · 1 of 1 slices shown (2 of 2)]
[im 1/1]
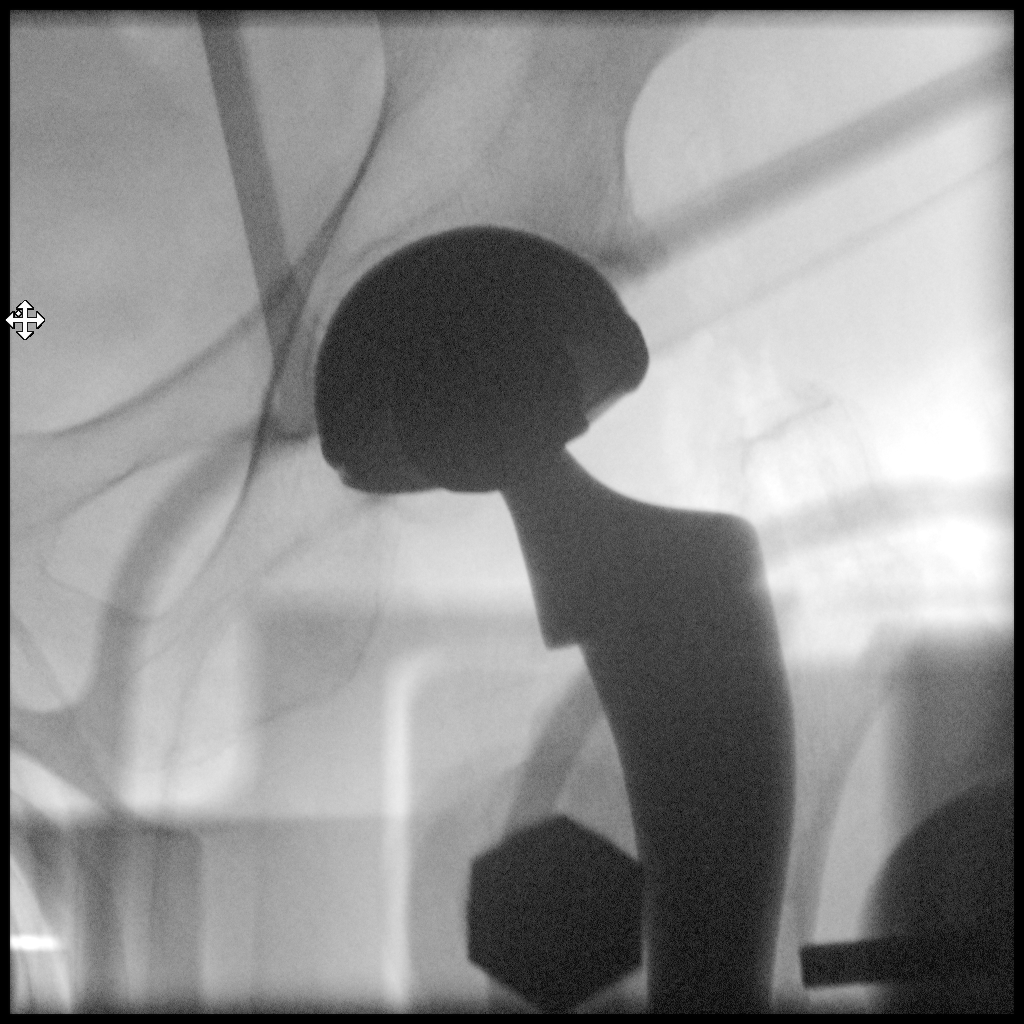

[2 of 2 positions shown; findings below may reference images not displayed]

FINDINGS: Preoperative image demonstrates degenerative changes at the left hip
joint. Postoperative image demonstrates total hip arthroplasty. The
femoral component is partially imaged.
IMPRESSION: Post left total hip arthroplasty.

## 2021-10-03 ENCOUNTER — Encounter: Payer: Self-pay | Admitting: Podiatry

## 2021-10-03 ENCOUNTER — Other Ambulatory Visit: Payer: Self-pay | Admitting: Podiatry

## 2021-10-03 ENCOUNTER — Ambulatory Visit (INDEPENDENT_AMBULATORY_CARE_PROVIDER_SITE_OTHER): Payer: Medicare Other

## 2021-10-03 ENCOUNTER — Ambulatory Visit (INDEPENDENT_AMBULATORY_CARE_PROVIDER_SITE_OTHER): Payer: Medicare Other | Admitting: Podiatry

## 2021-10-03 ENCOUNTER — Other Ambulatory Visit: Payer: Self-pay

## 2021-10-03 DIAGNOSIS — M778 Other enthesopathies, not elsewhere classified: Secondary | ICD-10-CM

## 2021-10-03 DIAGNOSIS — S9031XA Contusion of right foot, initial encounter: Secondary | ICD-10-CM

## 2021-10-03 NOTE — Progress Notes (Signed)
Subjective:  Patient ID: Natasha [name], female    DOB: Jun 13, 1946,  MRN: [id number] HPI Chief Complaint  Patient presents with   Foot Pain    Dorsal and plantar forefoot right - stumped 2nd and 3rd toes about a month ago, went for back appt and he took xrays, swelling, worse at night, can't bend toes well, tried ice and heat   New Patient (Initial Visit)    75 y.o. female presents with the above complaint.   ROS: Denies fever chills nausea vomiting muscle aches pains calf pain back pain chest pain shortness of breath.  Past Medical History:  Diagnosis Date   CAD (coronary artery disease)    (4/05 carotid dopler- 39% stenosis) , (ECHO- mild LVH, EF 60%)   Cancer ([place]) 2010   skin cancer right cheek   CHF (congestive heart failure) ([place])    patient unaware of this diagnosis. may have been over 30 years ago.   DDD (degenerative disc disease), lumbar 11/2006   L5-S1 bulging disk   DDD (degenerative disc disease), lumbar    GERD (gastroesophageal reflux disease)    Headache syndrome 04/30/2017   Left occipital and ear    Hyperlipidemia    Hypertension    Osteopenia    Spinal stenosis    Thrombocytopenia ([place])    mild   Past Surgical History:  Procedure Laterality Date   APPENDECTOMY     BACK SURGERY  2004   fall at work. NO surgery, just back injections   BREAST BIOPSY Right 2017   core- neg   CHOLECYSTECTOMY     SHOULDER SURGERY  2004   fall.  shoulder arthroscopy.  RCR.   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   Procedure: RIGHT TOTAL HIP ARTHROPLASTY ANTERIOR APPROACH;  Surgeon: Hessie Knows, MD;  Location: [facility] ORS;  Service: Orthopedics;  Laterality: Right;   TOTAL HIP ARTHROPLASTY Left 01/18/2021   Procedure: TOTAL HIP ARTHROPLASTY ANTERIOR APPROACH;  Surgeon: Hessie Knows, MD;  Location: [facility] ORS;  Service: Orthopedics;  Laterality: Left;   TUBAL LIGATION      Current Outpatient Medications:    amLODipine (NORVASC) 5 MG tablet, Take 1 tablet (5 mg total) by  mouth daily., Disp: 90 tablet, Rfl: 3   amoxicillin (AMOXIL) 250 MG capsule, Take 250 mg by mouth 2 (two) times daily., Disp: , Rfl:    aspirin 81 MG chewable tablet, Chew 81 mg by mouth daily., Disp: , Rfl:    atorvastatin (LIPITOR) 20 MG tablet, Take 1 tablet (20 mg total) by mouth daily., Disp: 90 tablet, Rfl: 3   cetirizine (ZYRTEC) 10 MG tablet, Take 10 mg by mouth daily., Disp: , Rfl:    Cholecalciferol (VITAMIN D-3) 1000 UNITS CAPS, Take 2,000 Units by mouth daily. , Disp: , Rfl:    enoxaparin (LOVENOX) 40 MG/0.4ML injection, Inject 0.4 mLs (40 mg total) into the skin daily for 12 days., Disp: 4.8 mL, Rfl: 0   HYDROcodone-acetaminophen (NORCO) 7.5-325 MG tablet, Take 1-2 tablets by mouth every 4 (four) hours as needed for moderate pain (Natasha [name] 8 per 24 hours)., Disp: 30 tablet, Rfl: 0   losartan-hydrochlorothiazide (HYZAAR) 100-12.5 MG tablet, Take 0.5 tablets by mouth daily., Disp: 45 tablet, Rfl: 3   ondansetron (ZOFRAN) 4 MG tablet, Take 1 tablet (4 mg total) by mouth daily as needed for nausea or vomiting., Disp: 20 tablet, Rfl: 1   pantoprazole (PROTONIX) 40 MG tablet, Take 1 tablet (40 mg total) by mouth daily., Disp: 90 tablet, Rfl: 3  traMADol (ULTRAM) 50 MG tablet, Take 1 tablet (50 mg total) by mouth every 6 (six) hours as needed., Disp: 30 tablet, Rfl: 2  Allergies  Allergen Reactions   Clindamycin/Lincomycin Itching and Rash   Naproxen Sodium     stomach pain.  Irritated stomach horribly.   Review of Systems Objective:  There were no vitals filed for this visit.  General: Well developed, nourished, in no acute distress, alert and oriented x3   Dermatological: Skin is warm, dry and supple bilateral. Nails x 10 are well maintained; remaining integument appears unremarkable at this time. There are no open sores, no preulcerative lesions, no rash or signs of infection present.  Vascular: Dorsalis Pedis artery and Posterior Tibial artery pedal pulses are 2/4 bilateral with  immedate capillary fill time. Pedal hair growth present. No varicosities and no lower extremity edema present bilateral.   Neruologic: Grossly intact via light touch bilateral. Vibratory intact via tuning fork bilateral. Protective threshold with Semmes Wienstein monofilament intact to all pedal sites bilateral. Patellar and Achilles deep tendon reflexes 2+ bilateral. No Babinski or clonus noted bilateral.   Musculoskeletal: No gross boney pedal deformities bilateral. No pain, crepitus, or limitation noted with foot and ankle range of motion bilateral. Muscular strength 5/5 in all groups tested bilateral.  Pain on palpation third metatarsal head and neck and distal diaphyseal region and pain on end range of motion of the joint.  She also has pain on palpation of the proximal phalanx of the second toe.  Gait: Unassisted, Nonantalgic.    Radiographs:  Radiographs taken today and compared to radiographs that she has demonstrates what appears to be a possible fracture to the second metatarsal that is healing.  Appears to be a possible hairline fracture transversely across the metatarsal head #2 and #3.  Assessment & Plan:   Assessment: Currently we are going to call this contusion foot contusion metatarsals #2 and 3  Plan: Placed her in a compression anklet and a Darco shoe we will follow-up with her in 1 month if necessary.  If she is not improved in 1 month CT or MRI will be necessary.     Natasha [name], [place]

## 2021-12-07 ENCOUNTER — Telehealth: Payer: Self-pay | Admitting: Family Medicine

## 2021-12-07 DIAGNOSIS — I10 Essential (primary) hypertension: Secondary | ICD-10-CM

## 2021-12-07 DIAGNOSIS — R7303 Prediabetes: Secondary | ICD-10-CM

## 2021-12-07 DIAGNOSIS — D696 Thrombocytopenia, unspecified: Secondary | ICD-10-CM

## 2021-12-07 DIAGNOSIS — E7800 Pure hypercholesterolemia, unspecified: Secondary | ICD-10-CM

## 2021-12-07 NOTE — Telephone Encounter (Signed)
-----   Message from Ellamae Sia sent at 11/22/2021  2:33 PM EST ----- Regarding: Lab orders for Thursday, 12.29.22 Patient is scheduled for CPX labs, please order future labs, Thanks , Karna Christmas

## 2021-12-08 ENCOUNTER — Other Ambulatory Visit: Payer: Medicare Other

## 2021-12-08 NOTE — Progress Notes (Signed)
Subjective:   Natasha [name] is a 75 y.o. female who presents for Medicare Annual (Subsequent) preventive examination.  I connected with Judithann Graves today by telephone and verified that I am speaking with the correct person using two identifiers. Location patient: home Location provider: work Persons participating in the virtual visit: patient, Marine scientist.    I discussed the limitations, risks, security and privacy concerns of performing an evaluation and management service by telephone and the availability of in person appointments. I also discussed with the patient that there may be a patient responsible charge related to this service. The patient expressed understanding and verbally consented to this telephonic visit.    Interactive audio and video telecommunications were attempted between this provider and patient, however failed, due to patient having technical difficulties OR patient did not have access to video capability.  We continued and completed visit with audio only.  Some vital signs may be absent or patient reported.   Time Spent with patient on telephone encounter: 20 minutes  Review of Systems     Cardiac Risk Factors include: advanced age (>27men, >42 women);hypertension;dyslipidemia     Objective:    Today's Vitals   12/13/21 0946  Weight: 183 lb (83 kg)  Height: 5\' 4"  (1.626 m)   Body mass index is 31.41 kg/m.  Advanced Directives 12/13/2021 01/18/2021 01/12/2021 12/08/2020 05/11/2020 05/05/2020 12/03/2019  Does Patient Have a Medical Advance Directive? Yes Yes Yes Yes Yes Yes Yes  Type of Paramedic of [place];Living will Living will [place];Living will [place];Living will Healthcare Power of Attorney Living will;Healthcare Power of [place];Living will  Does patient want to make changes to medical advance directive? Yes (MAU/Ambulatory/Procedural Areas - Information  given) No - Patient declined No - Patient declined - No - Patient declined No - Patient declined -  Copy of [place] in Chart? - No - copy requested No - copy requested Yes - validated most recent copy scanned in chart (See row information) - No - copy requested Yes - validated most recent copy scanned in chart (See row information)    Current Medications (verified) Outpatient Encounter Medications as of 12/13/2021  Medication Sig   amLODipine (NORVASC) 5 MG tablet Take 1 tablet (5 mg total) by mouth daily.   aspirin 81 MG chewable tablet Chew 81 mg by mouth daily.   atorvastatin (LIPITOR) 20 MG tablet Take 1 tablet (20 mg total) by mouth daily.   cetirizine (ZYRTEC) 10 MG tablet Take 10 mg by mouth daily.   Cholecalciferol (VITAMIN D-3) 1000 UNITS CAPS Take 2,000 Units by mouth daily.    losartan-hydrochlorothiazide (HYZAAR) 100-12.5 MG tablet Take 0.5 tablets by mouth daily.   pantoprazole (PROTONIX) 40 MG tablet Take 1 tablet (40 mg total) by mouth daily.   amoxicillin (AMOXIL) 250 MG capsule Take 250 mg by mouth 2 (two) times daily.   enoxaparin (LOVENOX) 40 MG/0.4ML injection Inject 0.4 mLs (40 mg total) into the skin daily for 12 days.   HYDROcodone-acetaminophen (NORCO) 7.5-325 MG tablet Take 1-2 tablets by mouth every 4 (four) hours as needed for moderate pain (max 8 per 24 hours).   ondansetron (ZOFRAN) 4 MG tablet Take 1 tablet (4 mg total) by mouth daily as needed for nausea or vomiting.   traMADol (ULTRAM) 50 MG tablet Take 1 tablet (50 mg total) by mouth every 6 (six) hours as needed.   No facility-administered encounter medications on  file as of 12/13/2021.    Allergies (verified) Clindamycin/lincomycin and Naproxen sodium   History: Past Medical History:  Diagnosis Date   CAD (coronary artery disease)    (4/05 carotid dopler- 39% stenosis) , (ECHO- mild LVH, EF 60%)   Cancer ([place]) 2010   skin cancer right cheek   CHF (congestive heart failure) ([place])     patient unaware of this diagnosis. may have been over 30 years ago.   DDD (degenerative disc disease), lumbar 11/2006   L5-S1 bulging disk   DDD (degenerative disc disease), lumbar    GERD (gastroesophageal reflux disease)    Headache syndrome 04/30/2017   Left occipital and ear    Hyperlipidemia    Hypertension    Osteopenia    Spinal stenosis    Thrombocytopenia ([place])    mild   Past Surgical History:  Procedure Laterality Date   APPENDECTOMY     BACK SURGERY  2004   fall at work. NO surgery, just back injections   BREAST BIOPSY Right 2017   core- neg   CHOLECYSTECTOMY     SHOULDER SURGERY  2004   fall.  shoulder arthroscopy.  RCR.   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   Procedure: RIGHT TOTAL HIP ARTHROPLASTY ANTERIOR APPROACH;  Surgeon: Hessie Knows, MD;  Location: [facility] ORS;  Service: Orthopedics;  Laterality: Right;   TOTAL HIP ARTHROPLASTY Left 01/18/2021   Procedure: TOTAL HIP ARTHROPLASTY ANTERIOR APPROACH;  Surgeon: Hessie Knows, MD;  Location: [facility] ORS;  Service: Orthopedics;  Laterality: Left;   TUBAL LIGATION     Family History  Problem Relation Age of Onset   Coronary artery disease Father    Lung cancer Sister    Breast cancer Maternal Grandmother    Diabetes Sister    Other Sister        OP   Other Mother        has pacemaker   Social History   Socioeconomic History   Marital status: Married    Spouse name: Elynn Patteson   Number of children: 4   Years of education: 12   Highest education level: Not on file  Occupational History   Occupation: Pharmacologist- Retired    Fish farm manager: [employer]  Tobacco Use   Smoking status: Never   Smokeless tobacco: Never  Vaping Use   Vaping Use: Never used  Substance and Sexual Activity   Alcohol use: No    Alcohol/week: 0.0 standard drinks   Drug use: No   Sexual activity: Not Currently  Other Topics Concern   Not on file  Social History Narrative   Lives with husband, Fritz Pickerel (he is 68 years older than  patient. Unable to provide her much assistance after surgery)    All 4 daughters will be helping patient out at home after surgery.   Caffeine use: 1 cup coffee every morning   Soda rare   Social Determinants of Health   Financial Resource Strain: Low Risk    Difficulty of Paying Living Expenses: Not hard at all  Food Insecurity: No Food Insecurity   Worried About Charity fundraiser in the Last Year: Never true   Arboriculturist in the Last Year: Never true  Transportation Needs: No Transportation Needs   Lack of Transportation (Medical): No   Lack of Transportation (Non-Medical): No  Physical Activity: Inactive   Days of Exercise per Week: 0 days   Minutes of Exercise per Session: 0 min  Stress: No Stress Concern Present  Feeling of Stress : Only a little  Social Connections: Moderately Integrated   Frequency of Communication with Friends and Family: More than three times a week   Frequency of Social Gatherings with Friends and Family: More than three times a week   Attends Religious Services: More than 4 times per year   Active Member of [employer] or Organizations: No   Attends Music therapist: Never   Marital Status: Married    Tobacco Counseling Counseling given: Not Answered   Clinical Intake:  Pre-visit preparation completed: Yes  Pain : No/denies pain     BMI - recorded: 31.41 Nutritional Risks: None Diabetes: No  How often do you need to have someone help you when you read instructions, pamphlets, or other written materials from your doctor or pharmacy?: 1 - Never  Diabetic? No  Interpreter Needed?: No  Information entered by :: Orrin Brigham LPN   Activities of Daily Living In your present state of health, do you have any difficulty performing the following activities: 12/13/2021 01/12/2021  Hearing? N -  Vision? N -  Difficulty concentrating or making decisions? N -  Walking or climbing stairs? N Y  Comment - left hip pain  Dressing or  bathing? N -  Doing errands, shopping? N N  Preparing Food and eating ? N -  Using the Toilet? N -  In the past six months, have you accidently leaked urine? N -  Do you have problems with loss of bowel control? N -  Managing your Medications? N -  Managing your Finances? N -  Housekeeping or managing your Housekeeping? N -  Some recent data might be hidden    Patient Care Team: Tower, Wynelle Fanny, MD as PCP - General Kerin Ransom Stephannie Li, OD as Consulting Physician (Optometry) Kathrynn Ducking, MD as Consulting Physician (Neurology) Altha Harm, DDS as Referring Physician (Dentistry)  Indicate any recent Medical Services you may have received from other than [facility] providers in the past year (date may be approximate).     Assessment:   This is a routine wellness examination for Ryane.  Hearing/Vision screen Hearing Screening - Comments:: No issues Vision Screening - Comments:: Last exam, due, plans to make an appointment  Dietary issues and exercise activities discussed: Current Exercise Habits: The patient does not participate in regular exercise at present   Goals Addressed             This Visit's Progress    Patient Stated       Would like to exercise and drink more water       Depression Screen PHQ 2/9 Scores 12/13/2021 12/08/2020 12/03/2019 11/27/2018 11/26/2017 11/24/2016 11/22/2015  PHQ - 2 Score 0 0 0 0 0 0 0  PHQ- 9 Score - 0 0 0 0 - -    Fall Risk Fall Risk  12/13/2021 12/08/2020 12/03/2019 11/27/2018 11/26/2017  Falls in the past year? 0 0 0 1 No  Comment - - - fell after missing a step; bruise to right hip -  Number falls in past yr: 0 0 0 0 -  Injury with Fall? 0 0 0 1 -  Risk for fall due to : No Fall Risks Medication side effect;Impaired balance/gait Medication side effect - -  Follow up Falls prevention discussed Falls evaluation completed;Falls prevention discussed Falls evaluation completed;Falls prevention discussed - -    FALL RISK PREVENTION  PERTAINING TO THE HOME:  Any stairs in or around the home? Yes  If so,  are there any without handrails? No  Home free of loose throw rugs in walkways, pet beds, electrical cords, etc? Yes  Adequate lighting in your home to reduce risk of falls? Yes   ASSISTIVE DEVICES UTILIZED TO PREVENT FALLS:  Life alert? No  Use of a cane, walker or w/c? No  Grab bars in the bathroom? Yes  Shower chair or bench in shower? No  Elevated toilet seat or a handicapped toilet? Yes   TIMED UP AND GO:  Was the test performed? No .    Cognitive Function: Normal cognitive status assessed by  this Nurse Health Advisor. No abnormalities found.   MMSE - Mini Mental State Exam 12/08/2020 12/03/2019 11/27/2018 11/26/2017 11/24/2016  Orientation to time 5 5 5 5 5   Orientation to Place 5 5 5 5 5   Registration 3 3 3 3 3   Attention/ Calculation 5 5 0 0 0  Recall 3 3 1 3 3   Recall-comments - - unable to recall 2 of 3 words - -  Language- name 2 objects - - 0 0 0  Language- repeat 1 1 1 1 1   Language- follow 3 step command - - 3 3 3   Language- read & follow direction - - 0 0 0  Write a sentence - - 0 0 0  Copy design - - 0 0 0  Total score - - 18 20 20         Immunizations Immunization History  Administered Date(s) Administered   Fluad Quad(high Dose 65+) 08/28/2019   Influenza Split 09/24/2012   Influenza, High Dose Seasonal PF 10/03/2016, 09/25/2018, 09/23/2021   Influenza,inj,Quad PF,6+ Mos 10/28/2013, 10/14/2014   Influenza-Unspecified 09/06/2015, 09/11/2017, 09/17/2020   PFIZER(Purple Top)SARS-COV-2 Vaccination 04/01/2020, 04/22/2020, 11/02/2020   Pneumococcal Conjugate-13 11/17/2014   Pneumococcal Polysaccharide-23 10/28/2013   Td 12/11/2002   Tdap 03/06/2014   Zoster Recombinat (Shingrix) 10/07/2020, 12/08/2020    TDAP status: Up to date  Flu Vaccine status: Up to date  Pneumococcal vaccine status: Up to date  Covid-19 vaccine status: Information provided on how to obtain vaccines.    Qualifies for Shingles Vaccine? Yes   Zostavax completed No   Shingrix Completed?: Yes  Screening Tests Health Maintenance  Topic Date Due   COLON CANCER SCREENING ANNUAL FOBT  12/02/2016   COVID-19 Vaccine (4 - Booster for Pfizer series) 12/28/2020   MAMMOGRAM  04/13/2022   Fecal DNA (Cologuard)  12/29/2023   TETANUS/TDAP  03/06/2024   Pneumonia Vaccine 69+ Years old  Completed   INFLUENZA VACCINE  Completed   DEXA SCAN  Completed   Hepatitis C Screening  Completed   Zoster Vaccines- Shingrix  Completed   HPV VACCINES  Aged Out   PAP SMEAR-Modifier  Discontinued    Health Maintenance  Health Maintenance Due  Topic Date Due   COLON CANCER SCREENING ANNUAL FOBT  12/02/2016   COVID-19 Vaccine (4 - Booster for [place] series) 12/28/2020    Colorectal cancer screening: Type of screening: Cologuard. Completed 118/22. Repeat every 3 years  Mammogram status: Completed 04/13/21. Repeat every year  Bone Density status: Completed 04/13/21. Results reflect: Bone density results: OSTEOPENIA. Repeat every 2 years.  Lung Cancer Screening: (Low Dose CT Chest recommended if Age 63-80 years, 30 pack-year currently smoking OR have quit w/in 15years.) does not qualify.     Additional Screening:  Hepatitis C Screening: does qualify; Completed 11/24/16  Vision Screening: Recommended annual ophthalmology exams for early detection of glaucoma and other disorders of the eye. Is the patient  up to date with their annual eye exam?  No , patient plans to make an appointment. Who is the provider or what is the name of the office in which the patient attends annual eye exams? Provider information unavailable.    Dental Screening: Recommended annual dental exams for proper oral hygiene  Community Resource Referral / Chronic Care Management: CRR required this visit?  No   CCM required this visit?  No      Plan:     I have personally reviewed and noted the following in the patients chart:    Medical and social history Use of alcohol, tobacco or illicit drugs  Current medications and supplements including opioid prescriptions.  Functional ability and status Nutritional status Physical activity Advanced directives List of other physicians Hospitalizations, surgeries, and ER visits in previous 12 months Vitals Screenings to include cognitive, depression, and falls Referrals and appointments  In addition, I have reviewed and discussed with patient certain preventive protocols, quality metrics, and best practice recommendations. A written personalized care plan for preventive services as well as general preventive health recommendations were provided to patient.    Due to this being a telephonic visit, the after visit summary with patients personalized plan was offered to patient via mail or my-chart. Patient would like to access on my-chart.      Loma Messing, LPN   02/16/4535   Nurse Health Advisor  Nurse Notes: none

## 2021-12-13 ENCOUNTER — Ambulatory Visit (INDEPENDENT_AMBULATORY_CARE_PROVIDER_SITE_OTHER): Payer: Medicare Other

## 2021-12-13 VITALS — Ht 64.0 in | Wt 183.0 lb

## 2021-12-13 DIAGNOSIS — Z0000 Encounter for general adult medical examination without abnormal findings: Secondary | ICD-10-CM | POA: Diagnosis not present

## 2021-12-13 NOTE — Patient Instructions (Signed)
Natasha [name] , Thank you for taking time to complete your Medicare Wellness Visit. I appreciate your ongoing commitment to your health goals. Please review the following plan we discussed and let me know if I can assist you in the future.   Screening recommendations/referrals: Colonoscopy: up to date, completed 12/28/20, due 12/29/23 Mammogram: up date, completed 04/13/21, due 04/13/22 Bone Density: up to date , completed 04/13/21, due 04/14/23 Recommended yearly ophthalmology/optometry visit for glaucoma screening and checkup Recommended yearly dental visit for hygiene and checkup  Vaccinations: Influenza vaccine: up to date Pneumococcal vaccine: up to date Tdap vaccine: up to date, 03/06/14, due 03/06/24 Shingles vaccine: up to date   Covid-19:newest booster available at your local pharmacy  Advanced directives: copy on file  Conditions/risks identified: see problem list  Next appointment: Follow up in one year for your annual wellness visit 12/14/22 @ 12:00pm, this will be a telephone visit   Preventive Care 19 Years and Older, Female Preventive care refers to lifestyle choices and visits with your health care provider that can promote health and wellness. What does preventive care include? A yearly physical exam. This is also called an annual well check. Dental exams once or twice a year. Routine eye exams. Ask your health care provider how often you should have your eyes checked. Personal lifestyle choices, including: Daily care of your teeth and gums. Regular physical activity. Eating a healthy diet. Avoiding tobacco and drug use. Limiting alcohol use. Practicing safe sex. Taking low-dose aspirin every day. Taking vitamin and mineral supplements as recommended by your health care provider. What happens during an annual well check? The services and screenings done by your health care provider during your annual well check will depend on your age, overall health, lifestyle risk factors,  and family history of disease. Counseling  Your health care provider may ask you questions about your: Alcohol use. Tobacco use. Drug use. Emotional well-being. Home and relationship well-being. Sexual activity. Eating habits. History of falls. Memory and ability to understand (cognition). Work and work Statistician. Reproductive health. Screening  You may have the following tests or measurements: Height, weight, and BMI. Blood pressure. Lipid and cholesterol levels. These may be checked every 5 years, or more frequently if you are over 13 years old. Skin check. Lung cancer screening. You may have this screening every year starting at age 89 if you have a 30-pack-year history of smoking and currently smoke or have quit within the past 15 years. Fecal occult blood test (FOBT) of the stool. You may have this test every year starting at age 6. Flexible sigmoidoscopy or colonoscopy. You may have a sigmoidoscopy every 5 years or a colonoscopy every 10 years starting at age 58. Hepatitis C blood test. Hepatitis B blood test. Sexually transmitted disease (STD) testing. Diabetes screening. This is done by checking your blood sugar (glucose) after you have not eaten for a while (fasting). You may have this done every 1-3 years. Bone density scan. This is done to screen for osteoporosis. You may have this done starting at age 17. Mammogram. This may be done every 1-2 years. Talk to your health care provider about how often you should have regular mammograms. Talk with your health care provider about your test results, treatment options, and if necessary, the need for more tests. Vaccines  Your health care provider may recommend certain vaccines, such as: Influenza vaccine. This is recommended every year. Tetanus, diphtheria, and acellular pertussis (Tdap, Td) vaccine. You may need a Td booster every  10 years. Zoster vaccine. You may need this after age 74. Pneumococcal 13-valent conjugate  (PCV13) vaccine. One dose is recommended after age 57. Pneumococcal polysaccharide (PPSV23) vaccine. One dose is recommended after age 74. Talk to your health care provider about which screenings and vaccines you need and how often you need them. This information is not intended to replace advice given to you by your health care provider. Make sure you discuss any questions you have with your health care provider. Document Released: 12/24/2015 Document Revised: 08/16/2016 Document Reviewed: 09/28/2015 Elsevier Interactive Patient Education  2017 [place] Prevention in the Home Falls can cause injuries. They can happen to people of all ages. There are many things you can do to make your home safe and to help prevent falls. What can I do on the outside of my home? Regularly fix the edges of walkways and driveways and fix any cracks. Remove anything that might make you trip as you walk through a door, such as a raised step or threshold. Trim any bushes or trees on the path to your home. Use bright outdoor lighting. Clear any walking paths of anything that might make someone trip, such as rocks or tools. Regularly check to see if handrails are loose or broken. Make sure that both sides of any steps have handrails. Any raised decks and porches should have guardrails on the edges. Have any leaves, snow, or ice cleared regularly. Use sand or salt on walking paths during winter. Clean up any spills in your garage right away. This includes oil or grease spills. What can I do in the bathroom? Use night lights. Install grab bars by the toilet and in the tub and shower. Do not use towel bars as grab bars. Use non-skid mats or decals in the tub or shower. If you need to sit down in the shower, use a plastic, non-slip stool. Keep the floor dry. Clean up any water that spills on the floor as soon as it happens. Remove soap buildup in the tub or shower regularly. Attach bath mats securely with  double-sided non-slip rug tape. Do not have throw rugs and other things on the floor that can make you trip. What can I do in the bedroom? Use night lights. Make sure that you have a light by your bed that is easy to reach. Do not use any sheets or blankets that are too big for your bed. They should not hang down onto the floor. Have a firm chair that has side arms. You can use this for support while you get dressed. Do not have throw rugs and other things on the floor that can make you trip. What can I do in the kitchen? Clean up any spills right away. Avoid walking on wet floors. Keep items that you use a lot in easy-to-reach places. If you need to reach something above you, use a strong step stool that has a grab bar. Keep electrical cords out of the way. Do not use floor polish or wax that makes floors slippery. If you must use wax, use non-skid floor wax. Do not have throw rugs and other things on the floor that can make you trip. What can I do with my stairs? Do not leave any items on the stairs. Make sure that there are handrails on both sides of the stairs and use them. Fix handrails that are broken or loose. Make sure that handrails are as long as the stairways. Check any carpeting to make  sure that it is firmly attached to the stairs. Fix any carpet that is loose or worn. Avoid having throw rugs at the top or bottom of the stairs. If you do have throw rugs, attach them to the floor with carpet tape. Make sure that you have a light switch at the top of the stairs and the bottom of the stairs. If you do not have them, ask someone to add them for you. What else can I do to help prevent falls? Wear shoes that: Do not have high heels. Have rubber bottoms. Are comfortable and fit you well. Are closed at the toe. Do not wear sandals. If you use a stepladder: Make sure that it is fully opened. Do not climb a closed stepladder. Make sure that both sides of the stepladder are locked  into place. Ask someone to hold it for you, if possible. Clearly mark and make sure that you can see: Any grab bars or handrails. First and last steps. Where the edge of each step is. Use tools that help you move around (mobility aids) if they are needed. These include: Canes. Walkers. Scooters. Crutches. Turn on the lights when you go into a dark area. Replace any light bulbs as soon as they burn out. Set up your furniture so you have a clear path. Avoid moving your furniture around. If any of your floors are uneven, fix them. If there are any pets around you, be aware of where they are. Review your medicines with your doctor. Some medicines can make you feel dizzy. This can increase your chance of falling. Ask your doctor what other things that you can do to help prevent falls. This information is not intended to replace advice given to you by your health care provider. Make sure you discuss any questions you have with your health care provider. Document Released: 09/23/2009 Document Revised: 05/04/2016 Document Reviewed: 01/01/2015 Elsevier Interactive Patient Education  2017 [employer].

## 2021-12-16 ENCOUNTER — Ambulatory Visit (INDEPENDENT_AMBULATORY_CARE_PROVIDER_SITE_OTHER): Payer: Medicare Other | Admitting: Family Medicine

## 2021-12-16 ENCOUNTER — Other Ambulatory Visit: Payer: Self-pay

## 2021-12-16 ENCOUNTER — Encounter: Payer: Self-pay | Admitting: Family Medicine

## 2021-12-16 VITALS — BP 136/80 | HR 59 | Temp 97.3°F | Ht 64.25 in | Wt 193.1 lb

## 2021-12-16 DIAGNOSIS — Z79899 Other long term (current) drug therapy: Secondary | ICD-10-CM | POA: Insufficient documentation

## 2021-12-16 DIAGNOSIS — E7800 Pure hypercholesterolemia, unspecified: Secondary | ICD-10-CM | POA: Diagnosis not present

## 2021-12-16 DIAGNOSIS — I509 Heart failure, unspecified: Secondary | ICD-10-CM

## 2021-12-16 DIAGNOSIS — D696 Thrombocytopenia, unspecified: Secondary | ICD-10-CM | POA: Diagnosis not present

## 2021-12-16 DIAGNOSIS — E6609 Other obesity due to excess calories: Secondary | ICD-10-CM

## 2021-12-16 DIAGNOSIS — I10 Essential (primary) hypertension: Secondary | ICD-10-CM

## 2021-12-16 DIAGNOSIS — M8589 Other specified disorders of bone density and structure, multiple sites: Secondary | ICD-10-CM

## 2021-12-16 DIAGNOSIS — R7303 Prediabetes: Secondary | ICD-10-CM | POA: Diagnosis not present

## 2021-12-16 DIAGNOSIS — Z0000 Encounter for general adult medical examination without abnormal findings: Secondary | ICD-10-CM | POA: Diagnosis not present

## 2021-12-16 DIAGNOSIS — Z6832 Body mass index (BMI) 32.0-32.9, adult: Secondary | ICD-10-CM

## 2021-12-16 LAB — CBC WITH DIFFERENTIAL/PLATELET
Basophils Absolute: 0 10*3/uL (ref 0.0–0.1)
Basophils Relative: 1 % (ref 0.0–3.0)
Eosinophils Absolute: 0.1 10*3/uL (ref 0.0–0.7)
Eosinophils Relative: 3.6 % (ref 0.0–5.0)
HCT: 40.7 % (ref 36.0–46.0)
Hemoglobin: 13.3 g/dL (ref 12.0–15.0)
Lymphocytes Relative: 25.1 % (ref 12.0–46.0)
Lymphs Abs: 1 10*3/uL (ref 0.7–4.0)
MCHC: 32.7 g/dL (ref 30.0–36.0)
MCV: 84.3 fl (ref 78.0–100.0)
Monocytes Absolute: 0.3 10*3/uL (ref 0.1–1.0)
Monocytes Relative: 7.6 % (ref 3.0–12.0)
Neutro Abs: 2.6 10*3/uL (ref 1.4–7.7)
Neutrophils Relative %: 62.7 % (ref 43.0–77.0)
Platelets: 135 10*3/uL — ABNORMAL LOW (ref 150.0–400.0)
RBC: 4.83 Mil/uL (ref 3.87–5.11)
RDW: 14.4 % (ref 11.5–15.5)
WBC: 4.1 10*3/uL (ref 4.0–10.5)

## 2021-12-16 LAB — VITAMIN B12: Vitamin B-12: 206 pg/mL — ABNORMAL LOW (ref 211–911)

## 2021-12-16 LAB — TSH: TSH: 4 u[IU]/mL (ref 0.35–5.50)

## 2021-12-16 LAB — HEMOGLOBIN A1C: Hgb A1c MFr Bld: 5.9 % (ref 4.6–6.5)

## 2021-12-16 LAB — COMPREHENSIVE METABOLIC PANEL
ALT: 9 U/L (ref 0–35)
AST: 13 U/L (ref 0–37)
Albumin: 4.4 g/dL (ref 3.5–5.2)
Alkaline Phosphatase: 96 U/L (ref 39–117)
BUN: 17 mg/dL (ref 6–23)
CO2: 31 mEq/L (ref 19–32)
Calcium: 10.2 mg/dL (ref 8.4–10.5)
Chloride: 105 mEq/L (ref 96–112)
Creatinine, Ser: 1.05 mg/dL (ref 0.40–1.20)
GFR: 51.83 mL/min — ABNORMAL LOW (ref >60.00)
Glucose, Bld: 97 mg/dL (ref 70–99)
Potassium: 4.3 mEq/L (ref 3.5–5.1)
Sodium: 142 mEq/L (ref 135–145)
Total Bilirubin: 0.7 mg/dL (ref 0.2–1.2)
Total Protein: 6.8 g/dL (ref 6.0–8.3)

## 2021-12-16 LAB — LIPID PANEL
Cholesterol: 172 mg/dL (ref 0–200)
HDL: 51.1 mg/dL (ref >39.00)
LDL Cholesterol: 86 mg/dL (ref 0–99)
NonHDL: 120.44
Total CHOL/HDL Ratio: 3
Triglycerides: 172 mg/dL — ABNORMAL HIGH (ref 0.0–149.0)
VLDL: 34.4 mg/dL (ref 0.0–40.0)

## 2021-12-16 NOTE — Assessment & Plan Note (Signed)
Disc goals for lipids and reasons to control them Rev last labs with pt Rev low sat fat diet in detail Diet is usually good (not holidays) Lipid panel today  Taking atorvastatin 20 mg daily

## 2021-12-16 NOTE — Assessment & Plan Note (Signed)
A1C ordered Diet good when not holidays disc imp of low glycemic diet and wt loss to prevent DM2

## 2021-12-16 NOTE — Assessment & Plan Note (Signed)
Pt is open to trial of wean Would start with 1/2 pill of protonix 40 mg daily  Discussed expectation of rebound symptoms Enc to watch diet   Disc risks of continued use B12 added to lab today

## 2021-12-16 NOTE — Assessment & Plan Note (Signed)
Discussed how this problem influences overall health and the risks it imposes  Reviewed plan for weight loss with lower calorie diet (via better food choices and also portion control or program like weight watchers) and exercise building up to or more than 30 minutes 5 days per week including some aerobic activity   More active now

## 2021-12-16 NOTE — Assessment & Plan Note (Signed)
dexa reviewed from may 2022 No falls or fractures  Taking vit D Much better exercise now after hip repl  Takes ppi   Discussed option of tx with alendronate Rev pros/cons and info given  She will call if interested in starting it

## 2021-12-16 NOTE — Assessment & Plan Note (Signed)
bp in fair control at this time  BP Readings from Last 1 Encounters:  12/16/21 136/80   No changes needed Most recent labs reviewed  Disc lifstyle change with low sodium diet and exercise  Plan to continue  Amlodipine 5 mg daily  Losartan 50-6.25 mg half pill daily

## 2021-12-16 NOTE — Assessment & Plan Note (Signed)
Reviewed health habits including diet and exercise and skin cancer prevention Reviewed appropriate screening tests for age  Also reviewed health mt list, fam hx and immunization status , as well as social and family history   See HPI Labs ordered  cologuard utd Mammogram utd Sees gyn dexa utd, no falls or fractures Encouraged bivalent covid vaccine Other vaccines utd Better exercise now with more mobility pHQ score of 0

## 2021-12-16 NOTE — Patient Instructions (Addendum)
Get the newest covid booster (bivalent)   Look at the info on alendronate and let me know if you want to try  To prevent broken bones   Labs today   Eventually we can consider cutting your protonix in 1/2  Then watch your diet for foods that cause your reflux   Your mammogram is due in late May  Call us in April to put in the order   Take care of yourself

## 2021-12-16 NOTE — Assessment & Plan Note (Signed)
Cbc No bruising /bleeding

## 2021-12-16 NOTE — Assessment & Plan Note (Signed)
No changes  Doing well Continues losartan

## 2021-12-16 NOTE — Progress Notes (Signed)
Subjective:    Patient ID: Natasha [name], female    DOB: 03/06/1946, 76 y.o.   MRN: [id number]  This visit occurred during the SARS-CoV-2 public health emergency.  Safety protocols were in place, including screening questions prior to the visit, additional usage of staff PPE, and extensive cleaning of exam room while observing appropriate contact time as indicated for disinfecting solutions.   HPI Here for health maintenance exam and to review chronic medical problems    Amw from 12/13/21 reviewed  Wt Readings from Last 3 Encounters:  12/16/21 193 lb 2 oz (87.6 kg)  12/13/21 183 lb (83 kg)  01/18/21 180 lb (81.6 kg)   32.89 kg/m  Doing fairly well  Had 2 hip replacements (2nd one was tough)  Now much better/doing a lot  Was walking 3 miles per day /now too cold Very active   Now bladder has dropped and will see a urogyn  Gyn is Dr Leafy Ro and waiting on appt    Colon cancer screening : neg cologuard 12/2020  Covid immunized -interested in the bivalent  Had shingrix vaccine  Utd flu   Dexa 04/2021-osteopenia Falls- none Fractures-none Supplements-vitamin D  Exercise - much better   Mammogram 04/2021 Self breast exam - no lumps  MGM had breast cancer   Depression screen [facility] 2/9 12/13/2021 12/08/2020 12/03/2019 11/27/2018 11/26/2017  Decreased Interest 0 0 0 0 0  Down, Depressed, Hopeless 0 0 0 0 0  PHQ - 2 Score 0 0 0 0 0  Altered sleeping - 0 0 0 0  Tired, decreased energy - 0 0 0 0  Change in appetite - 0 0 0 0  Feeling bad or failure about yourself  - 0 0 0 0  Trouble concentrating - 0 0 0 0  Moving slowly or fidgety/restless - 0 0 0 0  Suicidal thoughts - 0 0 0 0  PHQ-9 Score - 0 0 0 0  Difficult doing work/chores - Not difficult at all Not difficult at all Not difficult at all Not difficult at all     HTN bp is stable today  No cp or palpitations or headaches or edema  No side effects to medicines  BP Readings from Last 3 Encounters:  12/16/21 136/80   01/18/21 132/64  01/12/21 (!) 153/64    Amlodipine 5 mg daily  Losartan hct 50-6.25 mg  1/2 daily  H/o CHF and CAD Nothing new/stable   Hyperlipidemia Lab Results  Component Value Date   CHOL 163 12/08/2020   HDL 54.00 12/08/2020   LDLCALC 88 12/08/2020   TRIG 107.0 12/08/2020   CHOLHDL 3 12/08/2020   Taking atorvastatin 20 mg daily  Diet was pretty bad over holidays Is getting back to normal   Avoids fried foods and beef Very little sweets if not holidays   Prediabetes Lab Results  Component Value Date   HGBA1C 5.7 12/08/2020    GERD Takes protonix 40 mg  Has not considered decreasing dose but open to it   Patient Active Problem List   Diagnosis Date Noted   Current use of proton pump inhibitor 12/16/2021   Congestive heart failure ([place]) 12/15/2020   Status post total hip replacement, right 05/11/2020   Routine general medical examination at a health care facility 11/22/2015   Special screening for malignant neoplasms, colon 11/17/2014   Estrogen deficiency 11/17/2014   Encounter for Medicare annual wellness exam 10/28/2013   Prediabetes 10/28/2013   Obesity 10/28/2013   Post-menopausal 09/24/2012  Allergic rhinitis 04/26/2012   Osteopenia 11/10/2008   THROMBOCYTOPENIA 08/11/2008   POSTMENOPAUSAL STATUS 07/09/2008   HYPERCHOLESTEROLEMIA 07/16/2007   Essential hypertension 07/16/2007   CORONARY ARTERY DISEASE 07/16/2007   History of CHF (congestive heart failure) 07/16/2007   DEGENERATIVE [place] DISEASE, LUMBAR SPINE 07/16/2007   Past Medical History:  Diagnosis Date   CAD (coronary artery disease)    (4/05 carotid dopler- 39% stenosis) , (ECHO- mild LVH, EF 60%)   Cancer ([place]) 2010   skin cancer right cheek   CHF (congestive heart failure) ([place])    patient unaware of this diagnosis. may have been over 30 years ago.   DDD (degenerative disc disease), lumbar 11/2006   L5-S1 bulging disk   DDD (degenerative disc disease), lumbar    GERD  (gastroesophageal reflux disease)    Headache syndrome 04/30/2017   Left occipital and ear    Hyperlipidemia    Hypertension    Osteopenia    Spinal stenosis    Thrombocytopenia ([place])    mild   Past Surgical History:  Procedure Laterality Date   APPENDECTOMY     BACK SURGERY  2004   fall at work. NO surgery, just back injections   BREAST BIOPSY Right 2017   core- neg   CHOLECYSTECTOMY     SHOULDER SURGERY  2004   fall.  shoulder arthroscopy.  RCR.   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   Procedure: RIGHT TOTAL HIP ARTHROPLASTY ANTERIOR APPROACH;  Surgeon: Hessie Knows, MD;  Location: [facility] ORS;  Service: Orthopedics;  Laterality: Right;   TOTAL HIP ARTHROPLASTY Left 01/18/2021   Procedure: TOTAL HIP ARTHROPLASTY ANTERIOR APPROACH;  Surgeon: Hessie Knows, MD;  Location: [facility] ORS;  Service: Orthopedics;  Laterality: Left;   TUBAL LIGATION     Social History   Tobacco Use   Smoking status: Never   Smokeless tobacco: Never  Vaping Use   Vaping Use: Never used  Substance Use Topics   Alcohol use: No    Alcohol/week: 0.0 standard drinks   Drug use: No   Family History  Problem Relation Age of Onset   Coronary artery disease Father    Lung cancer Sister    Breast cancer Maternal Grandmother    Diabetes Sister    Other Sister        OP   Other Mother        has pacemaker   Allergies  Allergen Reactions   Clindamycin/Lincomycin Itching and Rash   Naproxen Sodium     stomach pain.  Irritated stomach horribly.   Current Outpatient Medications on File Prior to Visit  Medication Sig Dispense Refill   amLODipine (NORVASC) 5 MG tablet Take 1 tablet (5 mg total) by mouth daily. 90 tablet 3   aspirin 81 MG chewable tablet Chew 81 mg by mouth daily.     atorvastatin (LIPITOR) 20 MG tablet Take 1 tablet (20 mg total) by mouth daily. 90 tablet 3   cetirizine (ZYRTEC) 10 MG tablet Take 10 mg by mouth daily.     Cholecalciferol (VITAMIN D-3) 1000 UNITS CAPS Take 2,000 Units by  mouth daily.      losartan-hydrochlorothiazide (HYZAAR) 100-12.5 MG tablet Take 0.5 tablets by mouth daily. 45 tablet 3   pantoprazole (PROTONIX) 40 MG tablet Take 1 tablet (40 mg total) by mouth daily. 90 tablet 3   No current facility-administered medications on file prior to visit.     Review of Systems  Constitutional:  Negative for activity change, appetite change, fatigue, fever  and unexpected weight change.  HENT:  Negative for congestion, ear pain, rhinorrhea, sinus pressure and sore throat.   Eyes:  Negative for pain, redness and visual disturbance.  Respiratory:  Negative for cough, shortness of breath and wheezing.   Cardiovascular:  Negative for chest pain and palpitations.  Gastrointestinal:  Negative for abdominal pain, blood in stool, constipation and diarrhea.  Endocrine: Negative for polydipsia and polyuria.  Genitourinary:  Negative for dysuria, frequency and urgency.  Musculoskeletal:  Positive for arthralgias. Negative for back pain and myalgias.  Skin:  Negative for pallor and rash.  Allergic/Immunologic: Negative for environmental allergies.  Neurological:  Negative for dizziness, syncope and headaches.  Hematological:  Negative for adenopathy. Does not bruise/bleed easily.  Psychiatric/Behavioral:  Negative for decreased concentration and dysphoric mood. The patient is not nervous/anxious.       Objective:   Physical Exam Constitutional:      General: She is not in acute distress.    Appearance: Normal appearance. She is well-developed. She is obese. She is not ill-appearing or diaphoretic.  HENT:     Head: Normocephalic and atraumatic.     Right Ear: Tympanic membrane, ear canal and external ear normal.     Left Ear: Tympanic membrane, ear canal and external ear normal.     Nose: Nose normal. No congestion.     Mouth/Throat:     Mouth: Mucous membranes are moist.     Pharynx: Oropharynx is clear. No posterior oropharyngeal erythema.  Eyes:     General: No  scleral icterus.    Extraocular Movements: Extraocular movements intact.     Conjunctiva/sclera: Conjunctivae normal.     Pupils: Pupils are equal, round, and reactive to light.  Neck:     Thyroid: No thyromegaly.     Vascular: No carotid bruit or JVD.  Cardiovascular:     Rate and Rhythm: Normal rate and regular rhythm.     Pulses: Normal pulses.     Heart sounds: Normal heart sounds.    No gallop.  Pulmonary:     Effort: Pulmonary effort is normal. No respiratory distress.     Breath sounds: Normal breath sounds. No wheezing.     Comments: Good air exch Chest:     Chest wall: No tenderness.  Abdominal:     General: Bowel sounds are normal. There is no distension or abdominal bruit.     Palpations: Abdomen is soft. There is no mass.     Tenderness: There is no abdominal tenderness.     Hernia: No hernia is present.  Genitourinary:    Comments: Breast exam: No mass, nodules, thickening, tenderness, bulging, retraction, inflamation, nipple discharge or skin changes noted.  No axillary or clavicular LA.     Musculoskeletal:        General: No tenderness. Normal range of motion.     Cervical back: Normal range of motion and neck supple. No rigidity. No muscular tenderness.     Right lower leg: No edema.     Left lower leg: No edema.     Comments: No kyphosis   Lymphadenopathy:     Cervical: No cervical adenopathy.  Skin:    General: Skin is warm and dry.     Coloration: Skin is not pale.     Findings: No erythema or rash.     Comments: Solar lentigines diffusely Some sks   Neurological:     Mental Status: She is alert. Mental status is at baseline.     Cranial  Nerves: No cranial nerve deficit.     Motor: No abnormal muscle tone.     Coordination: Coordination normal.     Gait: Gait normal.     Deep Tendon Reflexes: Reflexes are normal and symmetric. Reflexes normal.  Psychiatric:        Mood and Affect: Mood normal.        Cognition and Memory: Cognition and memory  normal.          Assessment & Plan:   Problem List Items Addressed This Visit       Cardiovascular and Mediastinum   Essential hypertension    bp in fair control at this time  BP Readings from Last 1 Encounters:  12/16/21 136/80  No changes needed Most recent labs reviewed  Disc lifstyle change with low sodium diet and exercise  Plan to continue  Amlodipine 5 mg daily  Losartan 50-6.25 mg half pill daily      Congestive heart failure (H[facility])    No changes  Doing well Continues losartan        Musculoskeletal and Integument   Osteopenia    dexa reviewed from may 2022 No falls or fractures  Taking vit D Much better exercise now after hip repl  Takes ppi   Discussed option of tx with alendronate Rev pros/cons and info given  She will call if interested in starting it        Hematopoietic and Hemostatic   THROMBOCYTOPENIA    Cbc No bruising /bleeding        Other   HYPERCHOLESTEROLEMIA    Disc goals for lipids and reasons to control them Rev last labs with pt Rev low sat fat diet in detail Diet is usually good (not holidays) Lipid panel today  Taking atorvastatin 20 mg daily      Prediabetes    A1C ordered Diet good when not holidays disc imp of low glycemic diet and wt loss to prevent DM2        Obesity    Discussed how this problem influences overall health and the risks it imposes  Reviewed plan for weight loss with lower calorie diet (via better food choices and also portion control or program like weight watchers) and exercise building up to or more than 30 minutes 5 days per week including some aerobic activity   More active now      Routine general medical examination at a health care facility - Primary    Reviewed health habits including diet and exercise and skin cancer prevention Reviewed appropriate screening tests for age  Also reviewed health mt list, fam hx and immunization status , as well as social and family history   See  HPI Labs ordered  cologuard utd Mammogram utd Sees gyn dexa utd, no falls or fractures Encouraged bivalent covid vaccine Other vaccines utd Better exercise now with more mobility pHQ score of 0       Current use of proton pump inhibitor    Pt is open to trial of wean Would start with 1/2 pill of protonix 40 mg daily  Discussed expectation of rebound symptoms Enc to watch diet   Disc risks of continued use B12 added to lab today      Relevant Orders   Vitamin B12

## 2021-12-17 ENCOUNTER — Encounter: Payer: Self-pay | Admitting: Family Medicine

## 2021-12-17 DIAGNOSIS — E538 Deficiency of other specified B group vitamins: Secondary | ICD-10-CM | POA: Insufficient documentation

## 2021-12-19 ENCOUNTER — Telehealth: Payer: Self-pay | Admitting: *Deleted

## 2021-12-19 NOTE — Telephone Encounter (Signed)
Left VM requesting pt to call the office back 

## 2021-12-19 NOTE — Telephone Encounter (Signed)
-----   Message from Abner Greenspan, MD sent at 12/17/2021  2:04 PM EST ----- Needs a B12 shot-please call her to set this up Then labs for vit B12 level in 3-4 months

## 2021-12-20 NOTE — Telephone Encounter (Signed)
Natasha [name] called in returning phone call. Related the message and got her  scheduled for 1/18 @3  at [place]

## 2021-12-27 ENCOUNTER — Other Ambulatory Visit: Payer: Self-pay | Admitting: Family Medicine

## 2021-12-28 ENCOUNTER — Other Ambulatory Visit: Payer: Self-pay

## 2021-12-28 ENCOUNTER — Ambulatory Visit (INDEPENDENT_AMBULATORY_CARE_PROVIDER_SITE_OTHER): Payer: Medicare Other

## 2021-12-28 DIAGNOSIS — E538 Deficiency of other specified B group vitamins: Secondary | ICD-10-CM | POA: Diagnosis not present

## 2021-12-28 MED ORDER — CYANOCOBALAMIN 1000 MCG/ML IJ SOLN
1000.0000 ug | Freq: Once | INTRAMUSCULAR | Status: AC
  Administered 2021-12-28: 1000 ug via INTRAMUSCULAR

## 2021-12-28 NOTE — Progress Notes (Addendum)
Per orders of Dr. Glori Bickers, an injection of B12 was given by Ophelia Shoulder, CMA in left deltoid. Patient tolerated injection well.

## 2022-02-28 ENCOUNTER — Ambulatory Visit (INDEPENDENT_AMBULATORY_CARE_PROVIDER_SITE_OTHER): Payer: Medicare Other | Admitting: Obstetrics and Gynecology

## 2022-02-28 ENCOUNTER — Encounter: Payer: Self-pay | Admitting: Obstetrics and Gynecology

## 2022-02-28 ENCOUNTER — Other Ambulatory Visit: Payer: Self-pay

## 2022-02-28 VITALS — BP 173/81 | HR 81 | Ht 63.25 in | Wt 193.0 lb

## 2022-02-28 DIAGNOSIS — N393 Stress incontinence (female) (male): Secondary | ICD-10-CM

## 2022-02-28 DIAGNOSIS — N3941 Urge incontinence: Secondary | ICD-10-CM | POA: Diagnosis not present

## 2022-02-28 DIAGNOSIS — N8110 Cystocele, unspecified: Secondary | ICD-10-CM

## 2022-02-28 DIAGNOSIS — R350 Frequency of micturition: Secondary | ICD-10-CM | POA: Diagnosis not present

## 2022-02-28 DIAGNOSIS — N812 Incomplete uterovaginal prolapse: Secondary | ICD-10-CM | POA: Diagnosis not present

## 2022-02-28 LAB — POCT URINALYSIS DIPSTICK
Appearance: NORMAL
Bilirubin, UA: NEGATIVE
Blood, UA: NEGATIVE
Glucose, UA: NEGATIVE
Ketones, UA: NEGATIVE
Nitrite, UA: NEGATIVE
Protein, UA: NEGATIVE
Spec Grav, UA: 1.02 (ref 1.010–1.025)
Urobilinogen, UA: 0.2 E.U./dL
pH, UA: 7 (ref 5.0–8.0)

## 2022-02-28 NOTE — Progress Notes (Signed)
[facility] New Patient Evaluation and Consultation  Referring Provider: Christeen Douglas, MD PCP: Judy Pimple, MD Date of Service: 02/28/2022  SUBJECTIVE Chief Complaint: New Patient (Initial Visit) Natasha [name] is a 76 y.o. female here for a consult on prolapse)  History of Present Illness: Natasha [name] is a 76 y.o. White or Caucasian female seen in consultation at the request of Dr. Dalbert Garnet for evaluation of prolapse.    Review of records from Dr Dalbert Garnet significant for: Has anterior vaginal prolapse and interested in surgery, does not want pessary.   Urinary Symptoms: Leaks urine with cough/ sneeze, with a full bladder, and with movement to the bathroom Leaks 2 time(s) per day.  Pad use: 3 pads per day.   She is bothered by her UI symptoms.  Day time voids 5.  Nocturia: 2 times per night to void. Voiding dysfunction: she empties her bladder well.  does not use a catheter to empty bladder.  When urinating, she feels a weak stream and dribbling after finishing Drinks: 1 cup coffee in AM, otherwise water   UTIs:  0  UTI's in the last year.   Denies history of blood in urine and kidney or bladder stones  Pelvic Organ Prolapse Symptoms:                  She Admits to a feeling of a bulge the vaginal area. It has been present for 9 months.  She Admits to seeing a bulge.  This bulge is bothersome.  Bowel Symptom: Bowel movements: 2 time(s) per week Stool consistency: hard Straining: yes.  Splinting: no.  Incomplete evacuation: no.  She Denies accidental bowel leakage / fecal incontinence Bowel regimen: fiber and stool softener, not using regularly  Sexual Function Sexually active: yes.  Pain with sex: Yes, at the vaginal opening, deep in the pelvis, has discomfort due to prolapse  Pelvic Pain Admits to pelvic pain   Past Medical History:  Past Medical History:  Diagnosis Date   CAD (coronary artery disease)    (4/05 carotid dopler- 39%  stenosis) , (ECHO- mild LVH, EF 60%)   Cancer (HCC) 2010   skin cancer right cheek   CHF (congestive heart failure) (HCC)    patient unaware of this diagnosis. may have been over 30 years ago.   DDD (degenerative disc disease), lumbar 11/2006   L5-S1 bulging disk   DDD (degenerative disc disease), lumbar    GERD (gastroesophageal reflux disease)    Headache syndrome 04/30/2017   Left occipital and ear    Hyperlipidemia    Hypertension    Osteopenia    Spinal stenosis    Thrombocytopenia (HCC)    mild     Past Surgical History:   Past Surgical History:  Procedure Laterality Date   APPENDECTOMY     BACK SURGERY  2004   fall at work. NO surgery, just back injections   BREAST BIOPSY Right 2017   core- neg   CHOLECYSTECTOMY     SHOULDER SURGERY  2004   fall.  shoulder arthroscopy.  RCR.   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   Procedure: RIGHT TOTAL HIP ARTHROPLASTY ANTERIOR APPROACH;  Surgeon: Kennedy Bucker, MD;  Location: [facility] ORS;  Service: Orthopedics;  Laterality: Right;   TOTAL HIP ARTHROPLASTY Left 01/18/2021   Procedure: TOTAL HIP ARTHROPLASTY ANTERIOR APPROACH;  Surgeon: Kennedy Bucker, MD;  Location: [facility] ORS;  Service: Orthopedics;  Laterality: Left;   TUBAL LIGATION  Past OB/GYN History: OB History  Gravida Para Term Preterm AB Living  6 4     2 4   SAB IAB Ectopic Multiple Live Births  2       4    # Outcome Date GA Lbr Len/2nd Weight Sex Delivery Anes PTL Lv  6 SAB           5 SAB           4 Para           3 Para           2 Para           1 Para             Obstetric Comments  SVD x 4.   Menopausal: Denies vaginal bleeding since menopause  Any history of abnormal pap smears: no.   Medications: She has a current medication list which includes the following prescription(s): amlodipine, aspirin, atorvastatin, cetirizine, vitamin d-3, losartan-hydrochlorothiazide, and pantoprazole.   Allergies: Patient is allergic to clindamycin/lincomycin and  naproxen sodium.   Social History:  Social History   Tobacco Use   Smoking status: Never   Smokeless tobacco: Never  Vaping Use   Vaping Use: Never used  Substance Use Topics   Alcohol use: No    Alcohol/week: 0.0 standard drinks   Drug use: No    Relationship status: married She lives with husband.   She is not employed. Regular exercise: No History of abuse: No  Family History:   Family History  Problem Relation Age of Onset   Coronary artery disease Father    Lung cancer Sister    Breast cancer Maternal Grandmother    Diabetes Sister    Other Sister        OP   Other Mother        has pacemaker     Review of Systems: Review of Systems  Constitutional:  Negative for fever, malaise/fatigue and weight loss.  Respiratory:  Negative for cough, shortness of breath and wheezing.   Cardiovascular:  Negative for chest pain, palpitations and leg swelling.  Gastrointestinal:  Negative for abdominal pain and blood in stool.  Genitourinary:  Negative for dysuria.  Musculoskeletal:  Negative for myalgias.  Skin:  Negative for rash.  Neurological:  Negative for dizziness and headaches.  Endo/Heme/Allergies:  Bruises/bleeds easily.  Psychiatric/Behavioral:  Negative for depression. The patient is not nervous/anxious.     OBJECTIVE Physical Exam: Vitals:   02/28/22 1508  BP: (!) 173/81  Pulse: 81  Weight: 193 lb (87.5 kg)  Height: 5' 3.25" (1.607 m)    Physical Exam Constitutional:      General: She is not in acute distress. Pulmonary:     Effort: Pulmonary effort is normal.  Abdominal:     General: There is no distension.     Palpations: Abdomen is soft.     Tenderness: There is no abdominal tenderness. There is no rebound.  Musculoskeletal:        General: No swelling. Normal range of motion.  Skin:    General: Skin is warm and dry.     Findings: No rash.  Neurological:     Mental Status: She is alert and oriented to person, place, and time.   Psychiatric:        Mood and Affect: Mood normal.        Behavior: Behavior normal.     GU / Detailed Urogynecologic Evaluation:  Pelvic Exam: Normal external  female genitalia; Bartholin's and Skene's glands normal in appearance; urethral meatus normal in appearance, no urethral masses or discharge.   CST: negative   Speculum exam reveals normal vaginal mucosa with atrophy. Cervix normal appearance. Uterus normal single, nontender. Adnexa no mass, fullness, tenderness.     Pelvic floor strength I/V  Pelvic floor musculature: Right levator non-tender, Right obturator non-tender, Left levator non-tender, Left obturator non-tender  POP-Q:   POP-Q  3                                            Aa   3                                           Ba  -6                                              C   5                                            Gh  3                                            Pb  10                                            tvl   -1                                            Ap  -1                                            Bp  -7                                              D     Rectal Exam:  Normal external rectum  Post-Void Residual (PVR) by Bladder Scan: In order to evaluate bladder emptying, we discussed obtaining a postvoid residual and she agreed to this procedure.  Procedure: The ultrasound unit was placed on the patient's abdomen in the suprapubic region after the patient had voided. A PVR of 38 ml was obtained by bladder scan.  Laboratory Results: POC urine: small leukocytes, negative nitrites. Asymptomatic   ASSESSMENT AND PLAN Ms. Dressel is a 76 y.o. with:  1. Prolapse of anterior vaginal wall   2. Uterovaginal prolapse, incomplete   3. SUI (stress urinary  incontinence, female)   4. Urge incontinence   5. Urinary frequency    Stage III anterior, Stage II posterior, Stage I apical prolapse - For treatment of pelvic organ  prolapse, we discussed options for management including expectant management, conservative management, and surgical management, such as Kegels, a pessary, pelvic floor physical therapy, and specific surgical procedures. - She is interested in surgery. We discussed two options for prolapse repair:  1) vaginal repair without mesh - Pros - safer, no mesh complications - Cons - not as strong as mesh repair, higher risk of recurrence  2) laparoscopic repair with mesh - Pros - stronger, better long-term success - Cons - risks of mesh implant (erosion into vagina or bladder, adhering to the rectum, pain) - these risks are lower than with a vaginal mesh but still exist - She prefers a vaginal procedure: TVH, USLS, Anterior repair, possible posterior repair - need surgical clearance from PCP Dr Milinda Antis  2. Incontinence - Will have her schedule urodynamic testing to further evaluate leakage.  - For treatment of stress urinary incontinence,  surgical options include a midurethral sling,  and transurethral injection of a bulking agent.  Return for urodynamic testing  Marguerita Beards, MD

## 2022-03-16 ENCOUNTER — Encounter: Payer: Self-pay | Admitting: Obstetrics and Gynecology

## 2022-03-20 ENCOUNTER — Telehealth: Payer: Self-pay | Admitting: Family Medicine

## 2022-03-20 ENCOUNTER — Ambulatory Visit: Payer: Medicare Other | Admitting: *Deleted

## 2022-03-20 DIAGNOSIS — E538 Deficiency of other specified B group vitamins: Secondary | ICD-10-CM

## 2022-03-20 NOTE — Telephone Encounter (Signed)
-----   Message from Ellamae Sia sent at 03/07/2022  2:36 PM EDT ----- ?Regarding: Lab orders for Tuesday, 4.11.23 ?Lab orders, B12? ? ?

## 2022-03-21 ENCOUNTER — Other Ambulatory Visit (INDEPENDENT_AMBULATORY_CARE_PROVIDER_SITE_OTHER): Payer: Medicare Other

## 2022-03-21 DIAGNOSIS — E538 Deficiency of other specified B group vitamins: Secondary | ICD-10-CM | POA: Diagnosis not present

## 2022-03-21 LAB — VITAMIN B12: Vitamin B-12: 1073 pg/mL — ABNORMAL HIGH (ref 211–911)

## 2022-03-22 ENCOUNTER — Encounter: Payer: Self-pay | Admitting: *Deleted

## 2022-03-22 NOTE — Addendum Note (Signed)
Addended by: Tammi Sou on: 03/22/2022 03:41 PM ? ? Modules accepted: Orders ? ?

## 2022-03-27 ENCOUNTER — Ambulatory Visit: Payer: Medicare Other | Admitting: Family Medicine

## 2022-03-27 ENCOUNTER — Encounter: Payer: Self-pay | Admitting: Family Medicine

## 2022-03-27 VITALS — BP 128/78 | HR 77 | Temp 97.5°F | Ht 64.25 in | Wt 196.0 lb

## 2022-03-27 DIAGNOSIS — R7303 Prediabetes: Secondary | ICD-10-CM

## 2022-03-27 DIAGNOSIS — D696 Thrombocytopenia, unspecified: Secondary | ICD-10-CM

## 2022-03-27 DIAGNOSIS — Z79899 Other long term (current) drug therapy: Secondary | ICD-10-CM

## 2022-03-27 DIAGNOSIS — Z01810 Encounter for preprocedural cardiovascular examination: Secondary | ICD-10-CM | POA: Diagnosis not present

## 2022-03-27 DIAGNOSIS — Z01818 Encounter for other preprocedural examination: Secondary | ICD-10-CM | POA: Insufficient documentation

## 2022-03-27 DIAGNOSIS — I10 Essential (primary) hypertension: Secondary | ICD-10-CM | POA: Diagnosis not present

## 2022-03-27 MED ORDER — PANTOPRAZOLE SODIUM 40 MG PO TBEC: DELAYED_RELEASE_TABLET | ORAL | 0 refills | Status: DC

## 2022-03-27 NOTE — Assessment & Plan Note (Signed)
Lab Results  ?Component Value Date  ? HGBA1C 5.9 12/16/2021  ? ?Controlled with diet  ?

## 2022-03-27 NOTE — Assessment & Plan Note (Signed)
Is down to 1/2 pill of protonix and doing well ?

## 2022-03-27 NOTE — Assessment & Plan Note (Signed)
Pt suspects this was an error  ?She has never had any cardiology f/u or testing and no cp or sob  ?

## 2022-03-27 NOTE — Progress Notes (Signed)
? ?Subjective:  ? ? Patient ID: Natasha [name], female    DOB: 03-07-1946, 76 y.o.   MRN: [id number] ? ?HPI ?Pt presents for surgical clearance for uro/gyn surgery (TVH, USLS, anterior repair)  ?Est surg time is 3 hours with general anesthesia with Dr Wannetta Sender ?Soon to schedule  ? ?Wt Readings from Last 3 Encounters:  ?03/27/22 196 lb (88.9 kg)  ?03/20/22 193 lb (87.5 kg)  ?02/28/22 193 lb (87.5 kg)  ? ?33.38 kg/m? ? ?Lost 42 lb  ?Then gained back 10 lb (had to stop exercise because she cares for her husband)  ? ? ? ?H/o HTN ?bp is stable today  ?No cp or palpitations or headaches or edema  ?No side effects to medicines  ?BP Readings from Last 3 Encounters:  ?03/27/22 128/78  ?03/20/22 (!) 167/81  ?02/28/22 (!) 173/81  ?   ?Pulse Readings from Last 3 Encounters:  ?03/27/22 77  ?03/20/22 67  ?02/28/22 81  ? ?Losartan hct 100-12.5 mg daily  ?Amlodipine 5 mg daily  ? ?Asa 81 mg daily  ? ?EKG today  ?Sinus bradycardia with rate of 57  ?Also RBBB baseline  ? ? ?Remote h/o CHF (? Unsure, this was over 30 years ago) -she thinks this is a mistake  ?Never saw a cardiologist  ?No clue where it came from  ?Never had echo or stress test  ?Thought it was a mistake  ?Want to remove from list  ? ?Upon further chart review she had nl carotid doppler in 05 and echo with EF of 60% ? ? ?No h/o blood clots  ?No h/o anesthesia reaction  ?No sleep apnea  ? ? ?Has never smoked ?Pulse ox 99% today  ? ?No pulmonary problems  ?No asthma  ? ?Past surgeries incl hip repl/back surg /shoulder surg and ccy  ? ? ? ?Past thrombocytopenia ?Stable ?No bruising or bleeding at all  ? ?Lab Results  ?Component Value Date  ? WBC 4.1 12/16/2021  ? HGB 13.3 12/16/2021  ? HCT 40.7 12/16/2021  ? MCV 84.3 12/16/2021  ? PLT 135.0 (L) 12/16/2021  ? ?Hyperlipidemia ?Atorvastatin 20 mg daily  ? ?Lab Results  ?Component Value Date  ? CHOL 172 12/16/2021  ? HDL 51.10 12/16/2021  ? [place] 86 12/16/2021  ? TRIG 172.0 (H) 12/16/2021  ? CHOLHDL 3 12/16/2021   ? ?Prediabetes ?Lab Results  ?Component Value Date  ? HGBA1C 5.9 12/16/2021  ? ? ?Lab Results  ?Component Value Date  ? CREATININE 1.05 12/16/2021  ? BUN 17 12/16/2021  ? NA 142 12/16/2021  ? K 4.3 12/16/2021  ? CL 105 12/16/2021  ? CO2 31 12/16/2021  ? ?Cut her protonix in 1/2 and doing very well!  ?Cut her B12 in 1/2 also  ? ?Patient Active Problem List  ? Diagnosis Date Noted  ? Pre-operative clearance 03/27/2022  ? Vitamin B12 deficiency 12/17/2021  ? Current use of proton pump inhibitor 12/16/2021  ? 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 Routine general medical examination at a health care facility 11/22/2015  ? Special screening for malignant neoplasms, colon 11/17/2014  ? Estrogen deficiency 11/17/2014  ? Encounter for Medicare annual wellness exam 10/28/2013  ? Prediabetes 10/28/2013  ? Obesity 10/28/2013  ? Post-menopausal 09/24/2012  ? Allergic rhinitis 04/26/2012  ? Osteopenia 11/10/2008  ? THROMBOCYTOPENIA 08/11/2008  ? POSTMENOPAUSAL STATUS 07/09/2008  ? HYPERCHOLESTEROLEMIA 07/16/2007  ? Essential hypertension 07/16/2007  ? [place] DISEASE, LUMBAR SPINE 07/16/2007  ? ?Past Medical History:  ?  Diagnosis Date  ? Cancer [facility]) 2010  ? skin cancer right cheek  ? DDD (degenerative disc disease), lumbar 11/2006  ? L5-S1 bulging disk  ? DDD (degenerative disc disease), lumbar   ? GERD (gastroesophageal reflux disease)   ? Headache syndrome 04/30/2017  ? Left occipital and ear   ? Hyperlipidemia   ? Hypertension   ? Osteopenia   ? Spinal stenosis   ? Thrombocytopenia ([place])   ? mild  ? ?Past Surgical History:  ?Procedure Laterality Date  ? APPENDECTOMY    ? BACK SURGERY  2004  ? fall at work. NO surgery, just back injections  ? BREAST BIOPSY Right 2017  ? core- neg  ? CHOLECYSTECTOMY    ? SHOULDER SURGERY  2004  ? fall.  shoulder arthroscopy.  RCR.  ? 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  ? Procedure: RIGHT TOTAL HIP ARTHROPLASTY ANTERIOR APPROACH;  Surgeon: Hessie Knows, MD;   Location: [facility] ORS;  Service: Orthopedics;  Laterality: Right;  ? TOTAL HIP ARTHROPLASTY Left 01/18/2021  ? Procedure: TOTAL HIP ARTHROPLASTY ANTERIOR APPROACH;  Surgeon: Hessie Knows, MD;  Location: [facility] ORS;  Service: Orthopedics;  Laterality: Left;  ? TUBAL LIGATION    ? ?Social History  ? ?Tobacco Use  ? Smoking status: Never  ? Smokeless tobacco: Never  ?Vaping Use  ? Vaping Use: Never used  ?Substance Use Topics  ? Alcohol use: No  ?  Alcohol/week: 0.0 standard drinks  ? Drug use: No  ? ?Family History  ?Problem Relation Age of Onset  ? Coronary artery disease Father   ? Lung cancer Sister   ? Breast cancer Maternal Grandmother   ? Diabetes Sister   ? Other Sister   ?     OP  ? Other Mother   ?     has pacemaker  ? ?Allergies  ?Allergen Reactions  ? Clindamycin/Lincomycin Itching and Rash  ? Naproxen Sodium   ?  stomach pain.  Irritated stomach horribly.  ? ?Current Outpatient Medications on File Prior to Visit  ?Medication Sig Dispense Refill  ? amLODipine (NORVASC) 5 MG tablet TAKE 1 TABLET BY MOUTH DAILY. 90 tablet 2  ? atorvastatin (LIPITOR) 20 MG tablet TAKE 1 TABLET BY MOUTH DAILY. 90 tablet 3  ? cetirizine (ZYRTEC) 10 MG tablet Take 10 mg by mouth daily.    ? Cholecalciferol (VITAMIN D-3) 1000 UNITS CAPS Take 2,000 Units by mouth daily.     ? losartan-hydrochlorothiazide (HYZAAR) 100-12.5 MG tablet TAKE 0.5 TABLET BY MOUTH DAILY. 45 tablet 3  ? vitamin B-12 (CYANOCOBALAMIN) 500 MCG tablet Take 500 mcg by mouth daily.    ? ?No current facility-administered medications on file prior to visit.  ?  ?Review of Systems  ?Constitutional:  Negative for activity change, appetite change, fatigue, fever and unexpected weight change.  ?HENT:  Negative for congestion, ear pain, rhinorrhea, sinus pressure and sore throat.   ?Eyes:  Negative for pain, redness and visual disturbance.  ?Respiratory:  Negative for cough, shortness of breath and wheezing.   ?Cardiovascular:  Negative for chest pain and palpitations.   ?Gastrointestinal:  Negative for abdominal pain, blood in stool, constipation and diarrhea.  ?Endocrine: Negative for polydipsia and polyuria.  ?Genitourinary:  Negative for dysuria, frequency and urgency.  ?     Gyn prolapse ?Urinary incontinence   ?Musculoskeletal:  Negative for arthralgias, back pain and myalgias.  ?Skin:  Negative for pallor and rash.  ?Allergic/Immunologic: Negative for environmental allergies.  ?Neurological:  Negative for dizziness, syncope  and headaches.  ?Hematological:  Negative for adenopathy. Does not bruise/bleed easily.  ?Psychiatric/Behavioral:  Negative for decreased concentration and dysphoric mood. The patient is not nervous/anxious.   ? ?   ?Objective:  ? Physical Exam ?Constitutional:   ?   General: She is not in acute distress. ?   Appearance: Normal appearance. She is well-developed. She is obese. She is not ill-appearing or diaphoretic.  ?HENT:  ?   Head: Normocephalic and atraumatic.  ?Eyes:  ?   Conjunctiva/sclera: Conjunctivae normal.  ?   Pupils: Pupils are equal, round, and reactive to light.  ?Neck:  ?   Thyroid: No thyromegaly.  ?   Vascular: No carotid bruit or JVD.  ?Cardiovascular:  ?   Rate and Rhythm: Normal rate and regular rhythm.  ?   Pulses: Normal pulses.  ?   Heart sounds: Normal heart sounds.  ?  No gallop.  ?Pulmonary:  ?   Effort: Pulmonary effort is normal. No respiratory distress.  ?   Breath sounds: Normal breath sounds. No wheezing or rales.  ?Abdominal:  ?   General: There is no distension or abdominal bruit.  ?   Palpations: Abdomen is soft.  ?Musculoskeletal:  ?   Cervical back: Normal range of motion and neck supple.  ?   Right lower leg: No edema.  ?   Left lower leg: No edema.  ?Lymphadenopathy:  ?   Cervical: No cervical adenopathy.  ?Skin: ?   General: Skin is warm and dry.  ?   Coloration: Skin is not pale.  ?   Findings: No rash.  ?Neurological:  ?   Mental Status: She is alert.  ?   Cranial Nerves: No cranial nerve deficit.  ?    Coordination: Coordination normal.  ?   Deep Tendon Reflexes: Reflexes are normal and symmetric. Reflexes normal.  ?Psychiatric:     ?   Mood and Affect: Mood normal.  ? ? ? ? ? ?   ?Assessment & Plan:  ? ?Problem List Items Addressed

## 2022-03-27 NOTE — Assessment & Plan Note (Signed)
bp in fair control at this time  ?BP Readings from Last 1 Encounters:  ?03/27/22 128/78  ? ?No changes needed ?Most recent labs reviewed  ?Disc Losartan hct 100-12.5 mg daily  ?Amlodipine 5 mg daily lifstyle change with low sodium diet and exercise  ?Plan to continue ? ?

## 2022-03-27 NOTE — Patient Instructions (Addendum)
I think you should do well with surgery  ? ? ?Hold the 81 mg aspirin , doubt you need it  ? ?Take care of yourself  ? ?If you need anything in the meantime let us know  ? ? ? ? ?

## 2022-03-27 NOTE — Assessment & Plan Note (Signed)
Stable with platelet ct of 135 ?

## 2022-03-27 NOTE — Assessment & Plan Note (Signed)
Healthy 76 yo female for uro/gyn surgery incl hysterectomy with general anesthesia  ?No h/o cardiac or pulmonary problems  ?There was a question of CHF and CAD but per pt that is an error in her chart  ?EKG is stable with RBBB (her baseline)  ?Inst to stop her asa  ?No h/o blood clots  ?No h/o anesthesia problems (many surgeries in the past)  ?Reviewed latest labs  ?HTN and prediabetes  are in good control  ?Suspect average risk for her age and obesity  ? ?

## 2022-03-30 ENCOUNTER — Ambulatory Visit: Payer: Medicare Other | Admitting: Obstetrics and Gynecology

## 2022-04-05 ENCOUNTER — Ambulatory Visit (INDEPENDENT_AMBULATORY_CARE_PROVIDER_SITE_OTHER): Payer: Medicare Other | Admitting: Obstetrics and Gynecology

## 2022-04-05 VITALS — BP 150/78 | HR 61 | Ht 63.0 in | Wt 196.0 lb

## 2022-04-05 DIAGNOSIS — N393 Stress incontinence (female) (male): Secondary | ICD-10-CM

## 2022-04-05 DIAGNOSIS — R350 Frequency of micturition: Secondary | ICD-10-CM

## 2022-04-05 LAB — POCT URINALYSIS DIPSTICK
Appearance: NORMAL
Bilirubin, UA: NEGATIVE
Blood, UA: NEGATIVE
Glucose, UA: NEGATIVE
Ketones, UA: NEGATIVE
Leukocytes, UA: NEGATIVE
Nitrite, UA: NEGATIVE
Protein, UA: NEGATIVE
Spec Grav, UA: 1.01 (ref 1.010–1.025)
Urobilinogen, UA: 0.2 E.U./dL
pH, UA: 7 (ref 5.0–8.0)

## 2022-04-05 NOTE — Patient Instructions (Signed)

## 2022-04-10 NOTE — Progress Notes (Signed)
[facility] ?Urodynamics Procedure ? ?Referring Physician: Tower, Wynelle Fanny, MD ?Date of Procedure: 04/05/2022 ? ?Natasha [name] is a 76 y.o. female who presents for urodynamic evaluation. Indication(s) for study: SUI ? ?Vital Signs: ?BP (!) 150/78   Pulse 61   Ht '5\' 3"'$  (1.6 m)   Wt 196 lb (88.9 kg)   LMP 12/11/1993   BMI 34.72 kg/m?  ? ?Laboratory Results: ?A catheterized urine specimen revealed: ? ?POC urine: negative ? ? ?Voiding Diary: ?Not performed ? ?Procedure Timeout: ? The correct patient was verified and the correct procedure was verified. The patient was in the correct position and safety precautions were reviewed based on at the patient's history. ? ?Urodynamic Procedure ?A 18F dual lumen urodynamics catheter was placed under sterile conditions into the patient's bladder. A 18F catheter was placed into the rectum in order to measure abdominal pressure. EMG patches were placed in the appropriate position.  All connections were confirmed and calibrations/adjusted made. Saline was instilled into the bladder through the dual lumen catheters.  Cough/valsalva pressures were measured periodically during filling.  Patient was allowed to void.  The bladder was then emptied of its residual. ? ?UROFLOW: ?Revealed a Qmax of 19 mL/sec.  She voided 245 mL and had a residual of 30 mL.  It was a normal pattern and represented normal habits. ? ?CMG: ?This was performed with sterile water in the sitting position at a fill rate of 30 mL/min.   ? ?First sensation of fullness was 106 mLs,  ?First urge was 124 mLs,  ?Strong urge was 146 mLs and  ?Capacity was 404 mLs ? ?Stress incontinence was demonstrated ?Lowest positive Barrier CLPP was 95 cmH20 at 175 ml. ?Highest negative Barrier VLPP was 84 cmH20 at 175 ml. ? ?Detrusor function was normal, with no phasic contractions seen.   ? ?Compliance:  normal. End fill detrusor pressure was 1.4cmH20.  Calculated compliance was 247m/cmH20 ? ?UPP: ?MUCP with barrier  reduction was 60 cm of water.  ? ? ?MICTURITION STUDY: ?Voiding was performed with reduction using scopettes in the sitting position.  Pdet at Qmax was 14.5 cm of water.  Qmax was 15.7 mL/sec.  It was a normal pattern.  She voided 374 mL and had a residual of 30 mL.  It was a volitional void, sustained detrusor contraction was present and abdominal straining was present ? ?EMG: ?This was performed with patches.  She had voluntary contractions, recruitment with fill was present and urethral sphincter was not relaxed with void. ? ?The details of the procedure with the study tracings have been scanned into EPIC.  ? ?Urodynamic Impression:  ?1. Sensation was normal; capacity was normal ?2. Stress Incontinence was demonstrated at normal pressures; ?3. Detrusor Overactivity was not demonstrated ?4. Emptying was normal with a normal PVR, a sustained detrusor contraction present,  abdominal straining present, increased urethral sphincter activity on EMG. ? ?Plan: ?- The patient will follow up  to discuss the findings and treatment options.  ? ?

## 2022-04-13 NOTE — Progress Notes (Signed)
[facility] ?Return Visit ? ?SUBJECTIVE  ?History of Present Illness: ?Natasha [name] is a 76 y.o. female seen in follow-up after urodynamic testing to discuss surgery.   ? ?Urodynamic Impression:  ?1. Sensation was normal; capacity was normal ?2. Stress Incontinence was demonstrated at normal pressures; ?3. Detrusor Overactivity was not demonstrated ?4. Emptying was normal with a normal PVR, a sustained detrusor contraction present,  abdominal straining present, increased urethral sphincter activity on EMG. ? ?Past Medical History: ?Patient  has a past medical history of Cancer ([place]) (2010), DDD (degenerative disc disease), lumbar (11/2006), DDD (degenerative disc disease), lumbar, GERD (gastroesophageal reflux disease), Headache syndrome (04/30/2017), Hyperlipidemia, Hypertension, Osteopenia, Spinal stenosis, and Thrombocytopenia ([place]).  ? ?Past Surgical History: ?She  has a past surgical history that includes Cholecystectomy; Tubal ligation; Shoulder surgery (2004); Back surgery (2004); Breast biopsy (Right, 2017); Appendectomy; Total hip arthroplasty (Right, 05/11/2020); and Total hip arthroplasty (Left, 01/18/2021).  ? ?Medications: ?She has a current medication list which includes the following prescription(s): amlodipine, atorvastatin, cetirizine, vitamin d-3, losartan-hydrochlorothiazide, pantoprazole, and vitamin b-12.  ? ?Allergies: ?Patient is allergic to clindamycin/lincomycin and naproxen sodium.  ? ?Social History: ?Patient  reports that she has never smoked. She has never used smokeless tobacco. She reports that she does not drink alcohol and does not use drugs.  ?  ?  ?OBJECTIVE  ?  ? ?Physical Exam: ?Vitals:  ? 04/14/22 1030  ?BP: 135/74  ?Pulse: 64  ? ?Gen: No apparent distress, A&O x 3. ? ?Detailed Urogynecologic Evaluation:  ?Deferred. Prior exam showed: ? ?POP-Q (02/28/22):  ?  ?POP-Q ?  ?3  ?                                          Aa   ?3 ?                                           Ba   ?-6  ?                                            C  ?  ?5  ?                                          Gh   ?3  ?                                          Pb   ?10  ?                                          tvl  ?  ?-1  ?  Ap   ?-1  ?                                          Bp   ?-7  ?                                            D  ?  ? ?ASSESSMENT AND PLAN  ?  ?Ms. Sammon is a 76 y.o. with:  ?1. SUI (stress urinary incontinence, female)   ?2. Prolapse of anterior vaginal wall   ?3. Uterovaginal prolapse, incomplete   ? ? ? ?Plan for surgery: Exam under anesthesia, total vaginal hysterectomy, bilateral salpingo-oophorectomy, anterior repair, possible posterior repair, mini sling (Altis), cystoscopy ? ?- We reviewed the patient's specific anatomic and functional findings, with the assistance of diagrams, and together finalized the above procedure. The planned surgical procedures were discussed Additional treatment options including expectant management, conservative management, medical management were discussed where appropriate.  We reviewed the benefits and risks of each treatment option.  ?- We reviewed that although she empties her bladder well, she has some baseline voiding dysfunction. Therefore would recommend a single incision sling or urethral bulking rather than TVT.  ? ?- For preop Visit:  She is required to have a visit within 30 days of her surgery.  ? ? ?- Medical clearance: received from PCP ?- Anticoagulant use: No ?- Medicaid Hysterectomy form: n/a ?- Accepts blood transfusion: Yes ?- Expected length of stay: outpatient ? ?Request sent for surgery scheduling.  ? ?Jaquita Folds, MD ? ? ?

## 2022-04-14 ENCOUNTER — Encounter: Payer: Self-pay | Admitting: Obstetrics and Gynecology

## 2022-04-14 ENCOUNTER — Ambulatory Visit (INDEPENDENT_AMBULATORY_CARE_PROVIDER_SITE_OTHER): Payer: Medicare Other | Admitting: Obstetrics and Gynecology

## 2022-04-14 VITALS — BP 135/74 | HR 64

## 2022-04-14 DIAGNOSIS — N812 Incomplete uterovaginal prolapse: Secondary | ICD-10-CM | POA: Diagnosis not present

## 2022-04-14 DIAGNOSIS — N8110 Cystocele, unspecified: Secondary | ICD-10-CM

## 2022-04-14 DIAGNOSIS — N393 Stress incontinence (female) (male): Secondary | ICD-10-CM | POA: Diagnosis not present

## 2022-04-19 ENCOUNTER — Other Ambulatory Visit: Payer: Self-pay | Admitting: Family Medicine

## 2022-04-19 DIAGNOSIS — Z1231 Encounter for screening mammogram for malignant neoplasm of breast: Secondary | ICD-10-CM

## 2022-04-28 ENCOUNTER — Ambulatory Visit
Admission: RE | Admit: 2022-04-28 | Discharge: 2022-04-28 | Disposition: A | Discharge status: Home / Self Care | Payer: Medicare Other | Source: Ambulatory Visit | Attending: Family Medicine | Admitting: Family Medicine

## 2022-04-28 DIAGNOSIS — Z1231 Encounter for screening mammogram for malignant neoplasm of breast: Secondary | ICD-10-CM | POA: Diagnosis present

## 2022-05-22 ENCOUNTER — Encounter: Payer: Self-pay | Admitting: Obstetrics and Gynecology

## 2022-05-22 ENCOUNTER — Ambulatory Visit (INDEPENDENT_AMBULATORY_CARE_PROVIDER_SITE_OTHER): Payer: Medicare Other | Admitting: Obstetrics and Gynecology

## 2022-05-22 VITALS — BP 128/79 | HR 79

## 2022-05-22 DIAGNOSIS — N8110 Cystocele, unspecified: Secondary | ICD-10-CM

## 2022-05-22 DIAGNOSIS — N393 Stress incontinence (female) (male): Secondary | ICD-10-CM

## 2022-05-22 DIAGNOSIS — N812 Incomplete uterovaginal prolapse: Secondary | ICD-10-CM | POA: Diagnosis not present

## 2022-05-22 NOTE — Progress Notes (Signed)
[facility] Pre-Operative visit  Subjective Chief Complaint: Natasha [name] presents for a preoperative encounter.   History of Present Illness: Natasha [name] is a 76 y.o. female who presents for preoperative visit.  She is scheduled to undergo Exam under anesthesia, total vaginal hysterectomy, bilateral salpingo-oophorectomy, anterior repair, possible posterior repair, single incision sling (Altis), cystoscopy on 06/05/22.  Her symptoms include vaginal bulge and urinary leakage, and she was was found to have Stage III anterior, Stage II posterior, Stage I apical prolapse .   Urodynamics showed: Urodynamic Impression:  1. Sensation was normal; capacity was normal 2. Stress Incontinence was demonstrated at normal pressures; 3. Detrusor Overactivity was not demonstrated 4. Emptying was normal with a normal PVR, a sustained detrusor contraction present,  abdominal straining present, increased urethral sphincter activity on EMG.  Past Medical History:  Diagnosis Date   Cancer ([place]) 2010   skin cancer right cheek   DDD (degenerative disc disease), lumbar 11/2006   L5-S1 bulging disk   DDD (degenerative disc disease), lumbar    GERD (gastroesophageal reflux disease)    Headache syndrome 04/30/2017   Left occipital and ear    Hyperlipidemia    Hypertension    Osteopenia    Spinal stenosis    Thrombocytopenia ([facility])    mild     Past Surgical History:  Procedure Laterality Date   APPENDECTOMY     BACK SURGERY  2004   fall at work. NO surgery, just back injections   BREAST BIOPSY Right 2017   core- neg   CHOLECYSTECTOMY     SHOULDER SURGERY  2004   fall.  shoulder arthroscopy.  RCR.   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   Procedure: RIGHT TOTAL HIP ARTHROPLASTY ANTERIOR APPROACH;  Surgeon: Hessie Knows, MD;  Location: [facility] ORS;  Service: Orthopedics;  Laterality: Right;   TOTAL HIP ARTHROPLASTY Left 01/18/2021   Procedure: TOTAL HIP ARTHROPLASTY ANTERIOR  APPROACH;  Surgeon: Hessie Knows, MD;  Location: [facility] ORS;  Service: Orthopedics;  Laterality: Left;   TUBAL LIGATION      is allergic to clindamycin/lincomycin and naproxen sodium.   Family History  Problem Relation Age of Onset   Coronary artery disease Father    Lung cancer Sister    Breast cancer Maternal Grandmother    Diabetes Sister    Other Sister        OP   Other Mother        has pacemaker    Social History   Tobacco Use   Smoking status: Never   Smokeless tobacco: Never  Vaping Use   Vaping Use: Never used  Substance Use Topics   Alcohol use: No    Alcohol/week: 0.0 standard drinks of alcohol   Drug use: No     Review of Systems was negative for a full 10 system review except as noted in the History of Present Illness.   Current Outpatient Medications:    amLODipine (NORVASC) 5 MG tablet, TAKE 1 TABLET BY MOUTH DAILY., Disp: 90 tablet, Rfl: 2   atorvastatin (LIPITOR) 20 MG tablet, TAKE 1 TABLET BY MOUTH DAILY., Disp: 90 tablet, Rfl: 3   cetirizine (ZYRTEC) 10 MG tablet, Take 10 mg by mouth daily., Disp: , Rfl:    Cholecalciferol (VITAMIN D-3) 1000 UNITS CAPS, Take 2,000 Units by mouth daily. , Disp: , Rfl:    losartan-hydrochlorothiazide (HYZAAR) 100-12.5 MG tablet, TAKE 0.5 TABLET BY MOUTH DAILY., Disp: 45 tablet, Rfl: 3   pantoprazole (PROTONIX) 40 MG tablet,  Take 1/2 pill once daily by mouth, Disp: 1 tablet, Rfl: 0   vitamin B-12 (CYANOCOBALAMIN) 500 MCG tablet, Take 500 mcg by mouth daily., Disp: , Rfl:    Objective Vitals:   05/22/22 1118  BP: 128/79  Pulse: 79    Gen: NAD CV: S1 S2 RRR Lungs: Clear to auscultation bilaterally Abd: soft, nontender   Previous Pelvic Exam showed: POP-Q (02/28/22):    POP-Q   3                                            Aa   3                                           Ba   -6                                              C    5                                            Gh   3                                             Pb   10                                            tvl    -1                                            Ap   -1                                            Bp   -7                                              D      Assessment/ Plan  Assessment: The patient is a 76 y.o. year old scheduled to undergo Exam under anesthesia, total vaginal hysterectomy, bilateral salpingo-oophorectomy, anterior repair, possible posterior repair, mini sling (Altis), cystoscopy. Verbal consent was obtained for these procedures.  Plan: General Surgical Consent: The patient has previously been counseled on alternative treatments, and the decision by the patient and provider was to proceed with the procedure listed above.  For all procedures, there are risks of bleeding, infection, damage to surrounding organs including but not limited to bowel, bladder, blood vessels, ureters  and nerves, and need for further surgery if an injury were to occur. These risks are all low with minimally invasive surgery.   There are risks of numbness and weakness at any body site or buttock/rectal pain.  It is possible that baseline pain can be worsened by surgery, either with or without mesh. If surgery is vaginal, there is also a low risk of possible conversion to laparoscopy or open abdominal incision where indicated. Very rare risks include blood transfusion, blood clot, heart attack, pneumonia, or death.   There is also a risk of short-term postoperative urinary retention with need to use a catheter. About half of patients need to go home from surgery with a catheter, which is then later removed in the office. The risk of long-term need for a catheter is very low. There is also a risk of worsening of overactive bladder.   Sling: The effectiveness of a midurethral vaginal mesh sling is approximately 85%, and thus, there will be times when you may leak urine after surgery, especially if your bladder is full or  if you have a strong cough. There is a balance between making the sling tight enough to treat your leakage but not too tight so that you have long-term difficulty emptying your bladder. A mesh sling will not directly treat overactive bladder/urge incontinence and may worsen it.  There is an FDA safety notification on vaginal mesh procedures for prolapse but NOT mesh slings. We have extensive experience and training with mesh placement and we have close postoperative follow up to identify any potential complications from mesh. It is important to realize that this mesh is a permanent implant that cannot be easily removed. There are rare risks of mesh exposure (2-4%), pain with intercourse (0-7%), and infection (<1%). The risk of mesh exposure if more likely in a woman with risks for poor healing (prior radiation, poorly controlled diabetes, or immunocompromised). The risk of new or worsened chronic pain after mesh implant is more common in women with baseline chronic pain and/or poorly controlled anxiety or depression. Approximately 2-4% of patients will experience longer-term post-operative voiding dysfunction that may require surgical revision of the sling. We also reviewed that postoperatively, her stream may not be as strong as before surgery.    Prolapse (with or without mesh): Risk factors for surgical failure  include things that put pressure on your pelvis and the surgical repair, including obesity, chronic cough, and heavy lifting or straining (including lifting children or adults, straining on the toilet, or lifting heavy objects such as furniture or anything weighing >25 lbs. Risks of recurrence is 20-30% with vaginal native tissue repair and a less than 10% with sacrocolpopexy with mesh.   We discussed consent for blood products. Risks for blood transfusion include allergic reactions, other reactions that can affect different body organs and managed accordingly, transmission of infectious diseases such  as HIV or Hepatitis. However, the blood is screened. Patient consents for blood products.  Pre-operative instructions:  She was instructed to not take Aspirin/NSAIDs x 7days prior to surgery. Antibiotic prophylaxis was ordered as indicated.  Catheter use: Patient will go home with foley if needed after post-operative voiding trial.  Post-operative instructions:  She was provided with specific post-operative instructions, including precautions and signs/symptoms for which we would recommend contacting us, in addition to daytime and after-hours contact phone numbers. This was provided on a handout.   Post-operative medications: Prescriptions for motrin, tylenol, miralax, and oxycodone were sent to her pharmacy. Discussed using ibuprofen and  tylenol on a schedule to limit use of narcotics.   Laboratory testing:  We will check labs: CBC and type and screen  Preoperative clearance:  She does require surgical clearance- clearance obtained from PCP Dr Glori Bickers.    Post-operative follow-up:  A post-operative appointment will be made for 6 weeks from the date of surgery. If she needs a post-operative nurse visit for a voiding trial, that will be set up after she leaves the hospital.    Patient will call the clinic or use MyChart should anything change or any new issues arise.   Jaquita Folds, MD  Time spent: I spent 20 minutes dedicated to the care of this patient on the date of this encounter to include pre-visit review of records, face-to-face time with the patient  and post visit documentation and ordering medication/ testing.

## 2022-05-25 MED ORDER — IBUPROFEN 600 MG PO TABS: 600.0000 mg | ORAL_TABLET | Freq: Four times a day (QID) | ORAL | 0 refills | Status: DC | PRN

## 2022-05-25 MED ORDER — OXYCODONE HCL 5 MG PO TABS: 5.0000 mg | ORAL_TABLET | ORAL | 0 refills | Status: DC | PRN

## 2022-05-25 MED ORDER — ACETAMINOPHEN 500 MG PO TABS: 500.0000 mg | ORAL_TABLET | Freq: Four times a day (QID) | ORAL | 0 refills | Status: DC | PRN

## 2022-05-25 MED ORDER — POLYETHYLENE GLYCOL 3350 17 GM/SCOOP PO POWD: 17.0000 g | Freq: Every day | ORAL | 0 refills | Status: AC

## 2022-05-25 NOTE — H&P (Signed)
[facility] Pre-Operative H&P  Subjective Chief Complaint: Natasha [name] presents for a preoperative encounter.   History of Present Illness: Natasha [name] is a 76 y.o. female who presents for preoperative visit.  She is scheduled to undergo Exam under anesthesia, total vaginal hysterectomy, bilateral salpingo-oophorectomy, anterior repair, possible posterior repair, single incision sling (Altis), cystoscopy on 06/05/22.  Her symptoms include vaginal bulge and urinary leakage, and she was was found to have Stage III anterior, Stage II posterior, Stage I apical prolapse .   Urodynamics showed: Urodynamic Impression:  1. Sensation was normal; capacity was normal 2. Stress Incontinence was demonstrated at normal pressures; 3. Detrusor Overactivity was not demonstrated 4. Emptying was normal with a normal PVR, a sustained detrusor contraction present,  abdominal straining present, increased urethral sphincter activity on EMG.  Past Medical History:  Diagnosis Date   Cancer ([place]) 2010   skin cancer right cheek   DDD (degenerative disc disease), lumbar 11/2006   L5-S1 bulging disk   DDD (degenerative disc disease), lumbar    GERD (gastroesophageal reflux disease)    Headache syndrome 04/30/2017   Left occipital and ear    Hyperlipidemia    Hypertension    Osteopenia    Spinal stenosis    Thrombocytopenia ([facility])    mild     Past Surgical History:  Procedure Laterality Date   APPENDECTOMY     BACK SURGERY  2004   fall at work. NO surgery, just back injections   BREAST BIOPSY Right 2017   core- neg   CHOLECYSTECTOMY     SHOULDER SURGERY  2004   fall.  shoulder arthroscopy.  RCR.   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   Procedure: RIGHT TOTAL HIP ARTHROPLASTY ANTERIOR APPROACH;  Surgeon: Hessie Knows, MD;  Location: [facility] ORS;  Service: Orthopedics;  Laterality: Right;   TOTAL HIP ARTHROPLASTY Left 01/18/2021   Procedure: TOTAL HIP ARTHROPLASTY ANTERIOR APPROACH;   Surgeon: Hessie Knows, MD;  Location: [facility] ORS;  Service: Orthopedics;  Laterality: Left;   TUBAL LIGATION      is allergic to clindamycin/lincomycin and naproxen sodium.   Family History  Problem Relation Age of Onset   Coronary artery disease Father    Lung cancer Sister    Breast cancer Maternal Grandmother    Diabetes Sister    Other Sister        OP   Other Mother        has pacemaker    Social History   Tobacco Use   Smoking status: Never   Smokeless tobacco: Never  Vaping Use   Vaping Use: Never used  Substance Use Topics   Alcohol use: No    Alcohol/week: 0.0 standard drinks of alcohol   Drug use: No     Review of Systems was negative for a full 10 system review except as noted in the History of Present Illness.  No current facility-administered medications for this encounter.  Current Outpatient Medications:    acetaminophen (TYLENOL) 500 MG tablet, Take 1 tablet (500 mg total) by mouth every 6 (six) hours as needed (pain)., Disp: 30 tablet, Rfl: 0   amLODipine (NORVASC) 5 MG tablet, TAKE 1 TABLET BY MOUTH DAILY., Disp: 90 tablet, Rfl: 2   atorvastatin (LIPITOR) 20 MG tablet, TAKE 1 TABLET BY MOUTH DAILY., Disp: 90 tablet, Rfl: 3   cetirizine (ZYRTEC) 10 MG tablet, Take 10 mg by mouth daily., Disp: , Rfl:    Cholecalciferol (VITAMIN D-3) 1000 UNITS CAPS, Take 2,000  Units by mouth daily. , Disp: , Rfl:    ibuprofen (ADVIL) 600 MG tablet, Take 1 tablet (600 mg total) by mouth every 6 (six) hours as needed., Disp: 30 tablet, Rfl: 0   losartan-hydrochlorothiazide (HYZAAR) 100-12.5 MG tablet, TAKE 0.5 TABLET BY MOUTH DAILY., Disp: 45 tablet, Rfl: 3   oxyCODONE (OXY IR/ROXICODONE) 5 MG immediate release tablet, Take 1 tablet (5 mg total) by mouth every 4 (four) hours as needed for severe pain., Disp: 15 tablet, Rfl: 0   pantoprazole (PROTONIX) 40 MG tablet, Take 1/2 pill once daily by mouth, Disp: 1 tablet, Rfl: 0   polyethylene glycol powder (GLYCOLAX/MIRALAX) 17  GM/SCOOP powder, Take 17 g by mouth daily. Drink 17g (1 scoop) dissolved in water per day., Disp: 255 g, Rfl: 0   vitamin B-12 (CYANOCOBALAMIN) 500 MCG tablet, Take 500 mcg by mouth daily., Disp: , Rfl:    Objective There were no vitals filed for this visit.   Gen: NAD CV: S1 S2 RRR Lungs: Clear to auscultation bilaterally Abd: soft, nontender   Previous Pelvic Exam showed: POP-Q (02/28/22):    POP-Q   3                                            Aa   3                                           Ba   -6                                              C    5                                            Gh   3                                            Pb   10                                            tvl    -1                                            Ap   -1                                            Bp   -7  D      Assessment/ Plan   The patient is a 76 y.o. year old with stage III POP and SUI scheduled to undergo Exam under anesthesia, total vaginal hysterectomy, bilateral salpingo-oophorectomy, anterior repair, possible posterior repair, mini sling (Altis), cystoscopy.    Jaquita Folds, MD

## 2022-05-26 ENCOUNTER — Encounter (HOSPITAL_BASED_OUTPATIENT_CLINIC_OR_DEPARTMENT_OTHER): Payer: Self-pay | Admitting: Obstetrics and Gynecology

## 2022-05-26 ENCOUNTER — Other Ambulatory Visit: Payer: Self-pay

## 2022-05-26 NOTE — Progress Notes (Signed)
Spoke w/ via phone for pre-op interview---Natasha [name] needs dos---- none              [name] results------06/01/22 [name] appt for cbc, bmp, type & screen COVID test -----patient states asymptomatic no test needed Arrive at -------0700 on Monday 06/05/22 NPO after MN NO Solid Food.  Clear liquids from MN until---0600 Med rec completed Medications to take morning of surgery -----Norvasc, Zyrtec, Protonix Diabetic medication -----n/a Patient instructed no nail polish to be worn day of surgery Patient instructed to bring photo id and insurance card day of surgery Patient aware to have Driver (ride ) / caregiver    for 24 hours after surgery - daughter, [name] Patient Special Instructions -----Extended stay instructions given. Pre-Op special Istructions -----none Patient verbalized understanding of instructions that were given at this phone interview. Patient denies shortness of breath, chest pain, fever, cough at this phone interview.   Pre-operative medical clearance given by Loura Pardon, MD on 03/27/2022. See OV dated 03/27/2022 in Epic & chart.

## 2022-05-26 NOTE — Progress Notes (Signed)
Your procedure is scheduled on Monday, 06/05/2022.  Report to [facility] AT   7:00 A. M.   Call this number if you have problems the morning of surgery  :[phone].   OUR ADDRESS IS [place].  WE ARE LOCATED IN [facility].  PLEASE BRING YOUR INSURANCE CARD AND PHOTO ID DAY OF SURGERY.  ONLY 2 PEOPLE ARE ALLOWED IN  WAITING  ROOM.                                      REMEMBER:  DO NOT EAT FOOD, CANDY GUM OR MINTS  AFTER MIDNIGHT THE NIGHT BEFORE YOUR SURGERY . YOU MAY HAVE CLEAR LIQUIDS FROM MIDNIGHT THE NIGHT BEFORE YOUR SURGERY UNTIL  6:00 AM. NO CLEAR LIQUIDS AFTER  6:00 AM DAY OF SURGERY.  YOU MAY  BRUSH YOUR TEETH MORNING OF SURGERY AND RINSE YOUR MOUTH OUT, NO CHEWING GUM CANDY OR MINTS.     CLEAR LIQUID DIET   Foods Allowed                                                                     Foods Excluded  Coffee and tea, regular and decaf                             liquids that you cannot  Plain Jell-O                                                                   see through such as: Fruit ices (not with fruit pulp)                                     milk, soups, orange juice  Plain  Popsicles                                    All solid food Carbonated beverages, regular and diet                                    Cranberry, grape and apple juices Sports drinks like Gatorade _____________________________________________________________________     TAKE THESE MEDICATIONS MORNING OF SURGERY: Norvasc (amlodipine), Lipitor (atorvastatin), Zyrtec (cetirizine), Protonix (pantoprazole)   UP TO 4 VISITORS  MAY VISIT IN THE EXTENDED RECOVERY ROOM UNTIL 800 PM ONLY.  ONE  VISITOR AGE 74 AND OVER MAY SPEND THE NIGHT AND MUST BE IN EXTENDED RECOVERY ROOM NO LATER THAN 800 PM . YOUR DISCHARGE TIME AFTER YOU SPEND THE NIGHT IS 900 AM THE MORNING AFTER YOUR SURGERY.  YOU MAY PACK A SMALL OVERNIGHT BAG WITH  TOILETRIES FOR  YOUR OVERNIGHT STAY IF YOU WISH.  YOUR PRESCRIPTION MEDICATIONS WILL BE PROVIDED DURING [facility] [place].                                      DO NOT WEAR JEWERLY, MAKE UP. DO NOT WEAR LOTIONS, POWDERS, PERFUMES OR NAIL POLISH ON YOUR FINGERNAILS. TOENAIL POLISH IS OK TO WEAR. DO NOT SHAVE FOR 48 HOURS PRIOR TO DAY OF SURGERY. MEN MAY SHAVE FACE AND NECK. CONTACTS, GLASSES, OR DENTURES MAY NOT BE WORN TO SURGERY.  REMEMBER: NO SMOKING, DRUGS OR ALCOHOL FOR 24 HOURS BEFORE YOUR SURGERY.                                    [facility] IS NOT RESPONSIBLE  FOR ANY BELONGINGS.                                                                    Marland Kitchen           [facility] - Preparing for Surgery Before surgery, you can play an important role.  Because skin is not sterile, your skin needs to be as free of germs as possible.  You can reduce the number of germs on your skin by washing with CHG (chlorahexidine gluconate) soap before surgery.  CHG is an antiseptic cleaner which kills germs and bonds with the skin to continue killing germs even after washing. Please DO NOT use if you have an allergy to CHG or antibacterial soaps.  If your skin becomes reddened/irritated stop using the CHG and inform your nurse when you arrive at Short Stay. Do not shave (including legs and underarms) for at least 48 hours prior to the first CHG shower.  You may shave your face/neck. Please follow these instructions carefully:  1.  Shower with CHG Soap the night before surgery and the  morning of Surgery.  2.  If you choose to wash your hair, wash your hair first as usual with your  normal  shampoo.  3.  After you shampoo, rinse your hair and body thoroughly to remove the  shampoo.                            4.  Use CHG as you would any other liquid soap.  You can apply chg directly  to the skin and wash , please wash your belly button thoroughly with chg soap provided night before and morning of your surgery.                      Gently with a scrungie or clean washcloth.  5.  Apply the CHG Soap to your body ONLY FROM THE NECK DOWN.   Do not use on face/ open                           Wound or open sores. Avoid contact with eyes, ears mouth and genitals (private parts).  Wash face,  Genitals (private parts) with your normal soap.             6.  Wash thoroughly, paying special attention to the area where your surgery  will be performed.  7.  Thoroughly rinse your body with warm water from the neck down.  8.  DO NOT shower/wash with your normal soap after using and rinsing off  the CHG Soap.                9.  Pat yourself dry with a clean towel.            10.  Wear clean pajamas.            11.  Place clean sheets on your bed the night of your first shower and do not  sleep with pets. Day of Surgery : Do not apply any lotions/deodorants the morning of surgery.  Please wear clean clothes to the hospital/surgery center.  IF YOU HAVE ANY SKIN IRRITATION OR PROBLEMS WITH THE SURGICAL SOAP, PLEASE GET A BAR OF GOLD DIAL SOAP AND SHOWER THE NIGHT BEFORE YOUR SURGERY AND THE MORNING OF YOUR SURGERY. PLEASE LET THE NURSE KNOW MORNING OF YOUR SURGERY IF YOU HAD ANY PROBLEMS WITH THE SURGICAL SOAP.   ________________________________________________________________________                                                        QUESTIONS [facility] PRE OP NURSE PHONE [phone].

## 2022-06-01 ENCOUNTER — Encounter (HOSPITAL_COMMUNITY)
Admission: RE | Admit: 2022-06-01 | Discharge: 2022-06-01 | Disposition: A | Discharge status: Home / Self Care | Payer: Medicare Other | Source: Ambulatory Visit | Attending: Obstetrics and Gynecology | Admitting: Obstetrics and Gynecology

## 2022-06-01 DIAGNOSIS — Z01812 Encounter for preprocedural laboratory examination: Secondary | ICD-10-CM | POA: Insufficient documentation

## 2022-06-01 DIAGNOSIS — Z01818 Encounter for other preprocedural examination: Secondary | ICD-10-CM

## 2022-06-01 DIAGNOSIS — N812 Incomplete uterovaginal prolapse: Secondary | ICD-10-CM | POA: Diagnosis not present

## 2022-06-01 LAB — CBC
HCT: 44.3 % (ref 36.0–46.0)
Hemoglobin: 14.6 g/dL (ref 12.0–15.0)
MCH: 28.7 pg (ref 26.0–34.0)
MCHC: 33 g/dL (ref 30.0–36.0)
MCV: 87 fL (ref 80.0–100.0)
Platelets: 138 10*3/uL — ABNORMAL LOW (ref 150–400)
RBC: 5.09 MIL/uL (ref 3.87–5.11)
RDW: 13 % (ref 11.5–15.5)
WBC: 3.9 10*3/uL — ABNORMAL LOW (ref 4.0–10.5)
nRBC: 0 % (ref 0.0–0.2)

## 2022-06-01 LAB — BASIC METABOLIC PANEL
Anion gap: 6 (ref 5–15)
BUN: 19 mg/dL (ref 8–23)
CO2: 27 mmol/L (ref 22–32)
Calcium: 10 mg/dL (ref 8.9–10.3)
Chloride: 107 mmol/L (ref 98–111)
Creatinine, Ser: 1.05 mg/dL — ABNORMAL HIGH (ref 0.44–1.00)
GFR, Estimated: 55 mL/min — ABNORMAL LOW (ref >60)
Glucose, Bld: 102 mg/dL — ABNORMAL HIGH (ref 70–99)
Potassium: 4.6 mmol/L (ref 3.5–5.1)
Sodium: 140 mmol/L (ref 135–145)

## 2022-06-04 NOTE — Anesthesia Preprocedure Evaluation (Addendum)
Anesthesia Evaluation  Patient identified by MRN, date of birth, ID band Patient awake    Reviewed: Allergy & Precautions, NPO status , Patient's Chart, lab work & pertinent test results  Airway Mallampati: II  TM Distance: >3 FB Neck ROM: Full    Dental  (+) Dental Advisory Given   Pulmonary neg pulmonary ROS,    Pulmonary exam normal breath sounds clear to auscultation       Cardiovascular hypertension, Pt. on medications Normal cardiovascular exam Rhythm:Regular Rate:Normal     Neuro/Psych  Headaches,    GI/Hepatic Neg liver ROS, GERD  ,  Endo/Other  negative endocrine ROS  Renal/GU negative Renal ROS     Musculoskeletal  (+) Arthritis ,   Abdominal (+) + obese,   Peds  Hematology negative hematology ROS (+)   Anesthesia Other Findings   Reproductive/Obstetrics                                                            Anesthesia Evaluation  Patient identified by MRN, date of birth, ID band Patient awake    Reviewed: Allergy & Precautions, H&P , NPO status , Patient's Chart, lab work & pertinent test results  History of Anesthesia Complications Negative for: history of anesthetic complications  Airway Mallampati: II  TM Distance: <3 FB Neck ROM: limited    Dental  (+) Chipped   Pulmonary neg pulmonary ROS, neg sleep apnea, neg COPD, Not current smoker,    Pulmonary exam normal        Cardiovascular hypertension, Pt. on medications (-) angina+ CAD and +CHF  (-) Past MI and (-) DOE Normal cardiovascular exam(-) dysrhythmias (-) Valvular Problems/Murmurs     Neuro/Psych  Headaches, neg Seizures negative psych ROS   GI/Hepatic Neg liver ROS, GERD  Medicated and Controlled,  Endo/Other  negative endocrine ROSneg diabetes  Renal/GU negative Renal ROS  Female genitourinary complaint: .pmh.     Musculoskeletal   Abdominal   Peds  Hematology negative  hematology ROS (+)   Anesthesia Other Findings Past Medical History: No date: CAD (coronary artery disease)     Comment:  (4/05 carotid dopler- 39% stenosis) , (ECHO- mild LVH,               EF 60%) 2010: Cancer (HCC)     Comment:  skin cancer right cheek No date: CHF (congestive heart failure) (HCC)     Comment:  patient unaware of this diagnosis. may have been over 30              years ago. 11/2006: DDD (degenerative disc disease), lumbar     Comment:  L5-S1 bulging disk No date: DDD (degenerative disc disease), lumbar No date: GERD (gastroesophageal reflux disease) 04/30/2017: Headache syndrome     Comment:  Left occipital and ear  No date: Hyperlipidemia No date: Hypertension No date: Osteopenia No date: Spinal stenosis No date: Thrombocytopenia (HCC)     Comment:  mild  Past Surgical History: No date: APPENDECTOMY 2004: BACK SURGERY     Comment:  fall at work. NO surgery, just back injections 2017: BREAST BIOPSY; Right     Comment:  core- neg No date: CHOLECYSTECTOMY 2004: SHOULDER SURGERY     Comment:  fall.  shoulder arthroscopy.  RCR. 05/11/2020: TOTAL HIP ARTHROPLASTY; Right  Comment:  Procedure: RIGHT TOTAL HIP ARTHROPLASTY ANTERIOR               APPROACH;  Surgeon: Kennedy Bucker, MD;  Location: [facility]               ORS;  Service: Orthopedics;  Laterality: Right; No date: TUBAL LIGATION   Reproductive/Obstetrics negative OB ROS                             Anesthesia Physical  Anesthesia Plan  ASA: III  Anesthesia Plan: Spinal   Post-op Pain Management:    Induction:   PONV Risk Score and Plan:   Airway Management Planned: Natural Airway and Nasal Cannula  Additional Equipment:   Intra-op Plan:   Post-operative Plan:   Informed Consent: I have reviewed the patients History and Physical, chart, labs and discussed the procedure including the risks, benefits and alternatives for the proposed anesthesia with the patient or  authorized representative who has indicated his/her understanding and acceptance.     Dental Advisory Given  Plan Discussed with: Anesthesiologist, CRNA and Surgeon  Anesthesia Plan Comments: (Patient reports no bleeding problems and no anticoagulant use.  Plan for spinal with backup GA  Patient consented for risks of anesthesia including but not limited to:  - adverse reactions to medications - damage to eyes, teeth, lips or other oral mucosa - nerve damage due to positioning  - risk of bleeding, infection and or nerve damage from spinal that could lead to paralysis - risk of headache or failed spinal - damage to teeth, lips or other oral mucosa - sore throat or hoarseness - damage to heart, brain, nerves, lungs, other parts of body or loss of life  Patient voiced understanding.)        Anesthesia Quick Evaluation  Anesthesia Physical Anesthesia Plan  ASA: 3  Anesthesia Plan: General   Post-op Pain Management: Tylenol PO (pre-op)*   Induction: Intravenous  PONV Risk Score and Plan: 4 or greater and Ondansetron, Dexamethasone, Treatment may vary due to age or medical condition and Midazolam  Airway Management Planned: Oral ETT  Additional Equipment:   Intra-op Plan:   Post-operative Plan: Extubation in OR  Informed Consent: I have reviewed the patients History and Physical, chart, labs and discussed the procedure including the risks, benefits and alternatives for the proposed anesthesia with the patient or authorized representative who has indicated his/her understanding and acceptance.     Dental advisory given  Plan Discussed with: CRNA  Anesthesia Plan Comments:        Anesthesia Quick Evaluation

## 2022-06-05 ENCOUNTER — Ambulatory Visit (HOSPITAL_BASED_OUTPATIENT_CLINIC_OR_DEPARTMENT_OTHER): Payer: Medicare Other | Admitting: Anesthesiology

## 2022-06-05 ENCOUNTER — Encounter (HOSPITAL_BASED_OUTPATIENT_CLINIC_OR_DEPARTMENT_OTHER): Payer: Self-pay | Admitting: Obstetrics and Gynecology

## 2022-06-05 ENCOUNTER — Other Ambulatory Visit: Payer: Self-pay

## 2022-06-05 ENCOUNTER — Ambulatory Visit (HOSPITAL_BASED_OUTPATIENT_CLINIC_OR_DEPARTMENT_OTHER)
Admission: RE | Admit: 2022-06-05 | Discharge: 2022-06-05 | Disposition: A | Discharge status: Home / Self Care | Payer: Medicare Other | Attending: Obstetrics and Gynecology | Admitting: Obstetrics and Gynecology

## 2022-06-05 ENCOUNTER — Encounter (HOSPITAL_BASED_OUTPATIENT_CLINIC_OR_DEPARTMENT_OTHER)
Admission: RE | Disposition: A | Discharge status: Home / Self Care | Payer: Self-pay | Source: Home / Self Care | Attending: Obstetrics and Gynecology

## 2022-06-05 DIAGNOSIS — N812 Incomplete uterovaginal prolapse: Secondary | ICD-10-CM

## 2022-06-05 DIAGNOSIS — N888 Other specified noninflammatory disorders of cervix uteri: Secondary | ICD-10-CM | POA: Insufficient documentation

## 2022-06-05 DIAGNOSIS — N858 Other specified noninflammatory disorders of uterus: Secondary | ICD-10-CM | POA: Diagnosis not present

## 2022-06-05 DIAGNOSIS — K219 Gastro-esophageal reflux disease without esophagitis: Secondary | ICD-10-CM | POA: Insufficient documentation

## 2022-06-05 DIAGNOSIS — Z79899 Other long term (current) drug therapy: Secondary | ICD-10-CM | POA: Insufficient documentation

## 2022-06-05 DIAGNOSIS — Z01818 Encounter for other preprocedural examination: Secondary | ICD-10-CM

## 2022-06-05 DIAGNOSIS — I10 Essential (primary) hypertension: Secondary | ICD-10-CM | POA: Diagnosis not present

## 2022-06-05 DIAGNOSIS — I110 Hypertensive heart disease with heart failure: Secondary | ICD-10-CM | POA: Diagnosis not present

## 2022-06-05 DIAGNOSIS — N393 Stress incontinence (female) (male): Secondary | ICD-10-CM | POA: Diagnosis not present

## 2022-06-05 DIAGNOSIS — I2510 Atherosclerotic heart disease of native coronary artery without angina pectoris: Secondary | ICD-10-CM | POA: Diagnosis not present

## 2022-06-05 DIAGNOSIS — N8111 Cystocele, midline: Secondary | ICD-10-CM | POA: Diagnosis not present

## 2022-06-05 DIAGNOSIS — I509 Heart failure, unspecified: Secondary | ICD-10-CM

## 2022-06-05 DIAGNOSIS — N72 Inflammatory disease of cervix uteri: Secondary | ICD-10-CM | POA: Insufficient documentation

## 2022-06-05 HISTORY — PX: ANTERIOR AND POSTERIOR REPAIR WITH SACROSPINOUS FIXATION: SHX6536

## 2022-06-05 HISTORY — PX: VAGINAL HYSTERECTOMY: SHX2639

## 2022-06-05 HISTORY — PX: BLADDER SUSPENSION: SHX72

## 2022-06-05 HISTORY — DX: Prediabetes: R73.03

## 2022-06-05 HISTORY — PX: CYSTOSCOPY: SHX5120

## 2022-06-05 HISTORY — DX: Pneumonia, unspecified organism: J18.9

## 2022-06-05 LAB — TYPE AND SCREEN
ABO/RH(D): A POS
Antibody Screen: NEGATIVE

## 2022-06-05 SURGERY — HYSTERECTOMY, VAGINAL
Anesthesia: General

## 2022-06-05 MED ORDER — LIDOCAINE HCL (CARDIAC) PF 100 MG/5ML IV SOSY
PREFILLED_SYRINGE | INTRAVENOUS | Status: DC | PRN
  Administered 2022-06-05: 100 mg via INTRAVENOUS

## 2022-06-05 MED ORDER — SODIUM CHLORIDE 0.9 % IR SOLN
Status: DC | PRN
  Administered 2022-06-05: 1000 mL via INTRAVESICAL

## 2022-06-05 MED ORDER — CEFAZOLIN SODIUM-DEXTROSE 2-4 GM/100ML-% IV SOLN
INTRAVENOUS | Status: AC
  Filled 2022-06-05: qty 100

## 2022-06-05 MED ORDER — AMISULPRIDE (ANTIEMETIC) 5 MG/2ML IV SOLN: 5.0000 mg | Freq: Once | INTRAVENOUS | Status: DC | PRN

## 2022-06-05 MED ORDER — ACETAMINOPHEN 500 MG PO TABS: 1000.0000 mg | ORAL_TABLET | Freq: Once | ORAL | Status: DC

## 2022-06-05 MED ORDER — ONDANSETRON HCL 4 MG PO TABS: 4.0000 mg | ORAL_TABLET | Freq: Four times a day (QID) | ORAL | Status: DC | PRN

## 2022-06-05 MED ORDER — ONDANSETRON HCL 4 MG/2ML IJ SOLN
INTRAMUSCULAR | Status: AC
  Filled 2022-06-05: qty 2

## 2022-06-05 MED ORDER — EPHEDRINE 5 MG/ML INJ
INTRAVENOUS | Status: AC
  Filled 2022-06-05: qty 5

## 2022-06-05 MED ORDER — FENTANYL CITRATE (PF) 100 MCG/2ML IJ SOLN
INTRAMUSCULAR | Status: AC
  Filled 2022-06-05: qty 2

## 2022-06-05 MED ORDER — CEFAZOLIN SODIUM-DEXTROSE 2-4 GM/100ML-% IV SOLN
2.0000 g | INTRAVENOUS | Status: AC
  Administered 2022-06-05: 2 g via INTRAVENOUS

## 2022-06-05 MED ORDER — ACETAMINOPHEN 500 MG PO TABS
1000.0000 mg | ORAL_TABLET | ORAL | Status: AC
  Administered 2022-06-05: 1000 mg via ORAL

## 2022-06-05 MED ORDER — LACTATED RINGERS IV SOLN: INTRAVENOUS | Status: DC

## 2022-06-05 MED ORDER — OXYCODONE HCL 5 MG PO TABS
5.0000 mg | ORAL_TABLET | ORAL | Status: DC | PRN
  Administered 2022-06-05 (×2): 5 mg via ORAL

## 2022-06-05 MED ORDER — LIDOCAINE-EPINEPHRINE 1 %-1:100000 IJ SOLN
INTRAMUSCULAR | Status: DC | PRN
  Administered 2022-06-05: 30 mL

## 2022-06-05 MED ORDER — PHENAZOPYRIDINE HCL 100 MG PO TABS: 200.0000 mg | ORAL_TABLET | ORAL | Status: DC

## 2022-06-05 MED ORDER — DEXAMETHASONE SODIUM PHOSPHATE 10 MG/ML IJ SOLN
INTRAMUSCULAR | Status: AC
  Filled 2022-06-05: qty 1

## 2022-06-05 MED ORDER — SUGAMMADEX SODIUM 200 MG/2ML IV SOLN
INTRAVENOUS | Status: DC | PRN
  Administered 2022-06-05: 200 mg via INTRAVENOUS

## 2022-06-05 MED ORDER — POVIDONE-IODINE 10 % EX SWAB: 2.0000 | Freq: Once | CUTANEOUS | Status: DC

## 2022-06-05 MED ORDER — WATER FOR IRRIGATION, STERILE IR SOLN
Status: DC | PRN
  Administered 2022-06-05: 1000 mL via SURGICAL_CAVITY

## 2022-06-05 MED ORDER — DEXAMETHASONE SODIUM PHOSPHATE 4 MG/ML IJ SOLN
INTRAMUSCULAR | Status: DC | PRN
  Administered 2022-06-05: 10 mg via INTRAVENOUS

## 2022-06-05 MED ORDER — FENTANYL CITRATE (PF) 100 MCG/2ML IJ SOLN
25.0000 ug | INTRAMUSCULAR | Status: DC | PRN
  Administered 2022-06-05: 50 ug via INTRAVENOUS
  Administered 2022-06-05 (×2): 25 ug via INTRAVENOUS
  Administered 2022-06-05: 50 ug via INTRAVENOUS

## 2022-06-05 MED ORDER — PROPOFOL 10 MG/ML IV BOLUS
INTRAVENOUS | Status: AC
  Filled 2022-06-05: qty 20

## 2022-06-05 MED ORDER — PHENAZOPYRIDINE HCL 100 MG PO TABS
ORAL_TABLET | ORAL | Status: AC
  Filled 2022-06-05: qty 2

## 2022-06-05 MED ORDER — ONDANSETRON HCL 4 MG/2ML IJ SOLN
INTRAMUSCULAR | Status: DC | PRN
  Administered 2022-06-05: 4 mg via INTRAVENOUS

## 2022-06-05 MED ORDER — OXYCODONE HCL 5 MG PO TABS
ORAL_TABLET | ORAL | Status: AC
  Filled 2022-06-05: qty 1

## 2022-06-05 MED ORDER — ROCURONIUM BROMIDE 100 MG/10ML IV SOLN
INTRAVENOUS | Status: DC | PRN
  Administered 2022-06-05 (×3): 20 mg via INTRAVENOUS
  Administered 2022-06-05: 60 mg via INTRAVENOUS

## 2022-06-05 MED ORDER — LIDOCAINE HCL (PF) 2 % IJ SOLN
INTRAMUSCULAR | Status: AC
  Filled 2022-06-05: qty 5

## 2022-06-05 MED ORDER — PHENYLEPHRINE HCL (PRESSORS) 10 MG/ML IV SOLN
INTRAVENOUS | Status: DC | PRN
  Administered 2022-06-05: 80 ug via INTRAVENOUS

## 2022-06-05 MED ORDER — ACETAMINOPHEN 500 MG PO TABS
ORAL_TABLET | ORAL | Status: AC
  Filled 2022-06-05: qty 2

## 2022-06-05 MED ORDER — SIMETHICONE 80 MG PO CHEW: 80.0000 mg | CHEWABLE_TABLET | Freq: Four times a day (QID) | ORAL | Status: DC | PRN

## 2022-06-05 MED ORDER — EPHEDRINE SULFATE (PRESSORS) 50 MG/ML IJ SOLN
INTRAMUSCULAR | Status: DC | PRN
  Administered 2022-06-05: 10 mg via INTRAVENOUS
  Administered 2022-06-05: 5 mg via INTRAVENOUS
  Administered 2022-06-05: 10 mg via INTRAVENOUS

## 2022-06-05 MED ORDER — PROPOFOL 10 MG/ML IV BOLUS
INTRAVENOUS | Status: DC | PRN
  Administered 2022-06-05: 40 mg via INTRAVENOUS
  Administered 2022-06-05: 160 mg via INTRAVENOUS

## 2022-06-05 MED ORDER — ONDANSETRON HCL 4 MG/2ML IJ SOLN: 4.0000 mg | Freq: Four times a day (QID) | INTRAMUSCULAR | Status: DC | PRN

## 2022-06-05 MED ORDER — OXYCODONE HCL 5 MG PO TABS: 5.0000 mg | ORAL_TABLET | Freq: Once | ORAL | Status: DC | PRN

## 2022-06-05 MED ORDER — ROCURONIUM BROMIDE 10 MG/ML (PF) SYRINGE
PREFILLED_SYRINGE | INTRAVENOUS | Status: AC
  Filled 2022-06-05: qty 10

## 2022-06-05 MED ORDER — PHENYLEPHRINE 80 MCG/ML (10ML) SYRINGE FOR IV PUSH (FOR BLOOD PRESSURE SUPPORT)
PREFILLED_SYRINGE | INTRAVENOUS | Status: AC
  Filled 2022-06-05: qty 10

## 2022-06-05 MED ORDER — ACETAMINOPHEN 325 MG PO TABS: 650.0000 mg | ORAL_TABLET | ORAL | Status: DC | PRN

## 2022-06-05 MED ORDER — FENTANYL CITRATE (PF) 100 MCG/2ML IJ SOLN
INTRAMUSCULAR | Status: DC | PRN
  Administered 2022-06-05 (×4): 50 ug via INTRAVENOUS

## 2022-06-05 MED ORDER — OXYCODONE HCL 5 MG/5ML PO SOLN: 5.0000 mg | Freq: Once | ORAL | Status: DC | PRN

## 2022-06-05 SURGICAL SUPPLY — 58 items
ADH SKN CLS APL DERMABOND .7 (GAUZE/BANDAGES/DRESSINGS) ×1
AGENT HMST KT MTR STRL THRMB (HEMOSTASIS)
APL ESCP 34 STRL LF DISP (HEMOSTASIS)
APPLICATOR SURGIFLO ENDO (HEMOSTASIS) IMPLANT
BLADE CLIPPER SENSICLIP SURGIC (BLADE) ×2 IMPLANT
BLADE SURG 15 STRL LF DISP TIS (BLADE) ×1 IMPLANT
BLADE SURG 15 STRL SS (BLADE) ×2
DERMABOND ADVANCED (GAUZE/BANDAGES/DRESSINGS) ×1
DERMABOND ADVANCED .7 DNX12 (GAUZE/BANDAGES/DRESSINGS) ×1 IMPLANT
DEVICE CAPIO SLIM SINGLE (INSTRUMENTS) IMPLANT
ELECT REM PT RETURN 9FT ADLT (ELECTROSURGICAL)
ELECTRODE REM PT RTRN 9FT ADLT (ELECTROSURGICAL) IMPLANT
GAUZE 4X4 16PLY ~~LOC~~+RFID DBL (SPONGE) ×3 IMPLANT
GLOVE BIO SURGEON STRL SZ 6 (GLOVE) ×2 IMPLANT
GLOVE BIOGEL PI IND STRL 6.5 (GLOVE) ×1 IMPLANT
GLOVE BIOGEL PI IND STRL 7.0 (GLOVE) ×1 IMPLANT
GLOVE BIOGEL PI INDICATOR 6.5 (GLOVE) ×1
GLOVE BIOGEL PI INDICATOR 7.0 (GLOVE) ×1
GLOVE ECLIPSE 6.0 STRL STRAW (GLOVE) ×2 IMPLANT
GOWN STRL REUS W/ TWL LRG LVL3 (GOWN DISPOSABLE) ×1 IMPLANT
GOWN STRL REUS W/TWL LRG LVL3 (GOWN DISPOSABLE) ×4 IMPLANT
HIBICLENS CHG 4% 4OZ BTL (MISCELLANEOUS) ×2 IMPLANT
HOLDER FOLEY CATH W/STRAP (MISCELLANEOUS) ×2 IMPLANT
KIT TURNOVER CYSTO (KITS) ×2 IMPLANT
LIGASURE IMPACT 36 18CM CVD LR (INSTRUMENTS) ×1 IMPLANT
MANIFOLD NEPTUNE II (INSTRUMENTS) ×2 IMPLANT
NDL MAYO 6 CRC TAPER PT (NEEDLE) ×1 IMPLANT
NEEDLE HYPO 22GX1.5 SAFETY (NEEDLE) ×2 IMPLANT
NEEDLE MAYO 6 CRC TAPER PT (NEEDLE) ×2 IMPLANT
NS IRRIG 1000ML POUR BTL (IV SOLUTION) ×2 IMPLANT
PACK CYSTO (CUSTOM PROCEDURE TRAY) ×2 IMPLANT
PACK VAGINAL WOMENS (CUSTOM PROCEDURE TRAY) ×2 IMPLANT
PAD OB MATERNITY 4.3X12.25 (PERSONAL CARE ITEMS) ×2 IMPLANT
RETRACTOR LONE STAR DISPOSABLE (INSTRUMENTS) ×2 IMPLANT
RETRACTOR STAY HOOK 5MM (MISCELLANEOUS) ×2 IMPLANT
SET IRRIG Y TYPE TUR BLADDER L (SET/KITS/TRAYS/PACK) ×2 IMPLANT
SLING ALTIS SYSTEM (Sling) ×1 IMPLANT
SPONGE T-LAP 18X36 ~~LOC~~+RFID STR (SPONGE) ×2 IMPLANT
SUCTION FRAZIER HANDLE 10FR (MISCELLANEOUS) ×2
SUCTION TUBE FRAZIER 10FR DISP (MISCELLANEOUS) ×1 IMPLANT
SURGIFLO W/THROMBIN 8M KIT (HEMOSTASIS) IMPLANT
SUT ABS MONO DBL WITH NDL 48IN (SUTURE) IMPLANT
SUT MON AB 2-0 SH 27 (SUTURE) IMPLANT
SUT PDS PLUS 0 (SUTURE) ×8
SUT PDS PLUS AB 0 CT-2 (SUTURE) ×4 IMPLANT
SUT VIC AB 0 CT1 27 (SUTURE) ×6
SUT VIC AB 0 CT1 27XBRD ANBCTR (SUTURE) ×1 IMPLANT
SUT VIC AB 0 CT1 27XBRD ANTBC (SUTURE) IMPLANT
SUT VIC AB 0 CT1 27XCR 8 STRN (SUTURE) ×2 IMPLANT
SUT VIC AB 2-0 SH 27 (SUTURE) ×2
SUT VIC AB 2-0 SH 27XBRD (SUTURE) ×1 IMPLANT
SUT VICRYL 2-0 SH 8X27 (SUTURE) ×1 IMPLANT
SYR 10ML LL (SYRINGE) ×2 IMPLANT
SYR BULB EAR ULCER 3OZ GRN STR (SYRINGE) ×2 IMPLANT
TOWEL OR 17X26 10 PK STRL BLUE (TOWEL DISPOSABLE) ×2 IMPLANT
TRAY FOLEY W/BAG SLVR 14FR (SET/KITS/TRAYS/PACK) ×2 IMPLANT
TRAY FOLEY W/BAG SLVR 14FR LF (SET/KITS/TRAYS/PACK) ×2 IMPLANT
UNDERPAD 30X36 HEAVY ABSORB (UNDERPADS AND DIAPERS) ×2 IMPLANT

## 2022-06-05 NOTE — Anesthesia Procedure Notes (Signed)
Procedure Name: Intubation Date/Time: 06/05/2022 7:43 AM  Performed by: Jessica Priest, CRNAPre-anesthesia Checklist: Patient identified, Emergency Drugs available, Suction available, Patient being monitored and Timeout performed Patient Re-evaluated:Patient Re-evaluated prior to induction Oxygen Delivery Method: Circle system utilized Preoxygenation: Pre-oxygenation with 100% oxygen Induction Type: IV induction Ventilation: Mask ventilation without difficulty Laryngoscope Size: Mac and 3 Grade View: Grade II Tube type: Oral Tube size: 7.0 mm Number of attempts: 1 Airway Equipment and Method: Stylet and Oral airway Placement Confirmation: ETT inserted through vocal cords under direct vision, positive ETCO2, breath sounds checked- equal and bilateral and CO2 detector Secured at: 23 cm Tube secured with: Tape Dental Injury: Teeth and Oropharynx as per pre-operative assessment

## 2022-06-05 NOTE — Transfer of Care (Signed)
Immediate Anesthesia Transfer of Care Note  Patient: Natasha [name]  Procedure(s) Performed: Procedure(s) (LRB): HYSTERECTOMY VAGINAL With bilateral salpingo-oophorectomy, uterosacral ligiment suspension (N/A) ANTERIOR  REPAIR (N/A) mini sling-[employer] Altis  (TOT) (N/A) CYSTOSCOPY (N/A)  Patient Location: PACU  Anesthesia Type: General  Level of Consciousness: awake, sedated, patient cooperative and responds to stimulation  Airway & Oxygen Therapy: Patient Spontanous Breathing and Patient connected to [place] oxygen  Post-op Assessment: Report given to PACU RN, Post -op Vital signs reviewed and stable and Patient moving all extremities  Post vital signs: Reviewed and stable  Complications: No apparent anesthesia complications

## 2022-06-06 ENCOUNTER — Encounter (HOSPITAL_BASED_OUTPATIENT_CLINIC_OR_DEPARTMENT_OTHER): Payer: Self-pay | Admitting: Obstetrics and Gynecology

## 2022-06-06 ENCOUNTER — Telehealth: Payer: Self-pay | Admitting: Obstetrics and Gynecology

## 2022-06-06 LAB — SURGICAL PATHOLOGY

## 2022-06-06 NOTE — Telephone Encounter (Signed)
Post- Op Call  Natasha [name] underwent Total vaginal hysterectomy with bilateral salpingo-oophorectomy, uterosacral ligament suspension, anterior repair, single incision sling (Altis), cystoscopy on 06/05/22 with Dr Florian Buff. The patient reports that her pain is controlled. She is taking Tylenol and Ibuprofen. Pt said she took 1 Oxycodone last night. She denies vaginal bleeding. She has not had a bowel movement and is taking Miralax for a bowel regimen. She was discharged without a catheter and will return on 07/19/22 for a voiding trial.   Salina April, CMA

## 2022-06-06 NOTE — Telephone Encounter (Signed)
Natasha [name] underwent Total vaginal hysterectomy with bilateral salpingo-oophorectomy, uterosacral ligament suspension, anterior repair, single incision sling (Altis), cystoscopy on 06/05/22.   She passed her voiding trial.  Voided  PVR by bladder scan was 96ml.   She was discharged without a catheter. Please call her for a routine post op check. Thanks!  Marguerita Beards, MD

## 2022-06-09 ENCOUNTER — Encounter: Payer: Self-pay | Admitting: *Deleted

## 2022-06-21 ENCOUNTER — Telehealth: Payer: Self-pay | Admitting: Obstetrics and Gynecology

## 2022-06-22 NOTE — Telephone Encounter (Signed)
Pt called. She states that this week she has noticed more brown discharge with some odor. Sometimes it can be pink. Discharge is slight, not changing pads frequently. Denies fevers. Otherwise feeling very well. Discussed that this can be normal as the sutures are healing but if symptoms worsen, or other symptoms present to let me know. Has post op scheduled for a few weeks.   Jaquita Folds, MD

## 2022-07-19 ENCOUNTER — Ambulatory Visit (INDEPENDENT_AMBULATORY_CARE_PROVIDER_SITE_OTHER): Payer: Medicare Other | Admitting: Obstetrics and Gynecology

## 2022-07-19 ENCOUNTER — Encounter: Payer: Self-pay | Admitting: Obstetrics and Gynecology

## 2022-07-19 VITALS — BP 137/72 | HR 57

## 2022-07-19 DIAGNOSIS — Z9889 Other specified postprocedural states: Secondary | ICD-10-CM

## 2022-07-19 NOTE — Progress Notes (Signed)
Urogynecology  Date of Visit: 07/19/2022  History of Present Illness: [name] is a 76 y.o. female scheduled today for a post-operative visit.   Surgery: s/p Total vaginal hysterectomy with bilateral salpingo-oophorectomy, uterosacral ligament suspension, anterior repair, single incision sling (Altis), cystoscopy on 06/05/22  She passed her postoperative void trial.   Postoperative course has been uncomplicated.   Today she reports sometimes she has some hot flashes/ night sweats which have started after the surgery. Denies vaginal bleeding.   UTI in the last 6 weeks? No  Pain? Yes , occasional discomfort in the lower abdomen, but has not needed pain medication.  She has not returned to her normal activity (except for postop restrictions) Vaginal bulge? No  Stress incontinence: No  Urgency/frequency: No  Urge incontinence: No  Voiding dysfunction: No  Bowel issues: No   Subjective Success: Do you usually have a bulge or something falling out that you can see or feel in the vaginal area? No  Retreatment Success: Any retreatment with surgery or pessary for any compartment? No   Pathology results: UTERUS, CERVIX, BILATERAL TUBES AND OVARIES:  Uterus with focally calcified arteries in the myometrium.  Atrophic endometrium.  Negative for endometrial hyperplasia and malignancy.  Uterus with a few Nabothian cysts and chronic erosive cervicitis.  Negative for SIL and glandular neoplasia in the uterine cervix.  Bilateral ovaries with atrophic changes.  Normal bilateral fallopian tubes.   Medications: She has a current medication list which includes the following prescription(s): acetaminophen, amlodipine, atorvastatin, cetirizine, vitamin d-3, losartan-hydrochlorothiazide, pantoprazole, polyethylene glycol powder, and cyanocobalamin.   Allergies: Patient is allergic to clindamycin/lincomycin and naproxen sodium.   Physical Exam: BP 137/72   Pulse (!) 57   LMP 12/11/1993     Pelvic Examination: Vagina: Incisions healing well. Sutures are present at the apex and there is not granulation tissue. No tenderness along the anterior or posterior vagina. No apical tenderness. No pelvic masses. No visible or palpable mesh.  POP-Q: POP-Q  -2                                            Aa   -2                                           Ba  -7.5                                              C   3                                            Gh  4                                            Pb  8  tvl   -3                                            Ap  -3                                            Bp                                                 D     ---------------------------------------------------------  Assessment and Plan:  1. Post-operative state     - Pathology results were reviewed with the patient today and she verbalized understanding that the results were benign.  - Can resume regular activity including exercise and intercourse,  if desired.  - Discussed avoidance of heavy lifting and straining long term to reduce the risk of recurrence.   Follow up as needed

## 2022-09-27 ENCOUNTER — Other Ambulatory Visit: Payer: Self-pay | Admitting: Family Medicine

## 2022-12-26 ENCOUNTER — Other Ambulatory Visit: Payer: Self-pay | Admitting: Family Medicine

## 2023-02-08 ENCOUNTER — Telehealth: Payer: Self-pay | Admitting: Family Medicine

## 2023-02-08 DIAGNOSIS — D696 Thrombocytopenia, unspecified: Secondary | ICD-10-CM

## 2023-02-08 DIAGNOSIS — I10 Essential (primary) hypertension: Secondary | ICD-10-CM

## 2023-02-08 DIAGNOSIS — E7800 Pure hypercholesterolemia, unspecified: Secondary | ICD-10-CM

## 2023-02-08 DIAGNOSIS — Z79899 Other long term (current) drug therapy: Secondary | ICD-10-CM

## 2023-02-08 DIAGNOSIS — E538 Deficiency of other specified B group vitamins: Secondary | ICD-10-CM

## 2023-02-08 DIAGNOSIS — R7303 Prediabetes: Secondary | ICD-10-CM

## 2023-02-08 NOTE — Telephone Encounter (Signed)
-----   Message from Velna Hatchet, RT sent at 02/01/2023  2:39 PM EST ----- Regarding: Fri 3/1 lab Patient is scheduled for cpx, please order future labs.  Thanks, Anda Kraft

## 2023-02-09 ENCOUNTER — Other Ambulatory Visit (INDEPENDENT_AMBULATORY_CARE_PROVIDER_SITE_OTHER): Payer: Medicare Other

## 2023-02-09 DIAGNOSIS — E7800 Pure hypercholesterolemia, unspecified: Secondary | ICD-10-CM | POA: Diagnosis not present

## 2023-02-09 DIAGNOSIS — D696 Thrombocytopenia, unspecified: Secondary | ICD-10-CM | POA: Diagnosis not present

## 2023-02-09 DIAGNOSIS — R7303 Prediabetes: Secondary | ICD-10-CM | POA: Diagnosis not present

## 2023-02-09 DIAGNOSIS — I10 Essential (primary) hypertension: Secondary | ICD-10-CM

## 2023-02-09 DIAGNOSIS — E538 Deficiency of other specified B group vitamins: Secondary | ICD-10-CM

## 2023-02-09 LAB — CBC WITH DIFFERENTIAL/PLATELET
Basophils Absolute: 0 10*3/uL (ref 0.0–0.1)
Basophils Relative: 1.1 % (ref 0.0–3.0)
Eosinophils Absolute: 0.1 10*3/uL (ref 0.0–0.7)
Eosinophils Relative: 3.6 % (ref 0.0–5.0)
HCT: 40.3 % (ref 36.0–46.0)
Hemoglobin: 13.5 g/dL (ref 12.0–15.0)
Lymphocytes Relative: 31 % (ref 12.0–46.0)
Lymphs Abs: 1.2 10*3/uL (ref 0.7–4.0)
MCHC: 33.5 g/dL (ref 30.0–36.0)
MCV: 85.2 fl (ref 78.0–100.0)
Monocytes Absolute: 0.3 10*3/uL (ref 0.1–1.0)
Monocytes Relative: 8.9 % (ref 3.0–12.0)
Neutro Abs: 2.1 10*3/uL (ref 1.4–7.7)
Neutrophils Relative %: 55.4 % (ref 43.0–77.0)
Platelets: 149 10*3/uL — ABNORMAL LOW (ref 150.0–400.0)
RBC: 4.73 Mil/uL (ref 3.87–5.11)
RDW: 13.9 % (ref 11.5–15.5)
WBC: 3.8 10*3/uL — ABNORMAL LOW (ref 4.0–10.5)

## 2023-02-09 LAB — COMPREHENSIVE METABOLIC PANEL
ALT: 10 U/L (ref 0–35)
AST: 15 U/L (ref 0–37)
Albumin: 4 g/dL (ref 3.5–5.2)
Alkaline Phosphatase: 96 U/L (ref 39–117)
BUN: 20 mg/dL (ref 6–23)
CO2: 30 mEq/L (ref 19–32)
Calcium: 10.2 mg/dL (ref 8.4–10.5)
Chloride: 105 mEq/L (ref 96–112)
Creatinine, Ser: 1.06 mg/dL (ref 0.40–1.20)
GFR: 50.83 mL/min — ABNORMAL LOW (ref >60.00)
Glucose, Bld: 94 mg/dL (ref 70–99)
Potassium: 4.3 mEq/L (ref 3.5–5.1)
Sodium: 141 mEq/L (ref 135–145)
Total Bilirubin: 0.8 mg/dL (ref 0.2–1.2)
Total Protein: 6.4 g/dL (ref 6.0–8.3)

## 2023-02-09 LAB — LIPID PANEL
Cholesterol: 192 mg/dL (ref 0–200)
HDL: 50.5 mg/dL (ref >39.00)
LDL Cholesterol: 118 mg/dL — ABNORMAL HIGH (ref 0–99)
NonHDL: 141.78
Total CHOL/HDL Ratio: 4
Triglycerides: 119 mg/dL (ref 0.0–149.0)
VLDL: 23.8 mg/dL (ref 0.0–40.0)

## 2023-02-09 LAB — HEMOGLOBIN A1C: Hgb A1c MFr Bld: 5.9 % (ref 4.6–6.5)

## 2023-02-09 LAB — TSH: TSH: 2.01 u[IU]/mL (ref 0.35–5.50)

## 2023-02-09 LAB — VITAMIN B12: Vitamin B-12: 1038 pg/mL — ABNORMAL HIGH (ref 211–911)

## 2023-02-16 ENCOUNTER — Encounter: Payer: Self-pay | Admitting: Family Medicine

## 2023-02-16 ENCOUNTER — Ambulatory Visit (INDEPENDENT_AMBULATORY_CARE_PROVIDER_SITE_OTHER): Payer: Medicare Other | Admitting: Family Medicine

## 2023-02-16 VITALS — BP 134/72 | HR 66 | Temp 97.3°F | Ht 64.0 in | Wt 192.5 lb

## 2023-02-16 DIAGNOSIS — M8589 Other specified disorders of bone density and structure, multiple sites: Secondary | ICD-10-CM | POA: Diagnosis not present

## 2023-02-16 DIAGNOSIS — D696 Thrombocytopenia, unspecified: Secondary | ICD-10-CM

## 2023-02-16 DIAGNOSIS — E538 Deficiency of other specified B group vitamins: Secondary | ICD-10-CM

## 2023-02-16 DIAGNOSIS — Z79899 Other long term (current) drug therapy: Secondary | ICD-10-CM

## 2023-02-16 DIAGNOSIS — Z0000 Encounter for general adult medical examination without abnormal findings: Secondary | ICD-10-CM

## 2023-02-16 DIAGNOSIS — I10 Essential (primary) hypertension: Secondary | ICD-10-CM

## 2023-02-16 DIAGNOSIS — E6609 Other obesity due to excess calories: Secondary | ICD-10-CM

## 2023-02-16 DIAGNOSIS — E7800 Pure hypercholesterolemia, unspecified: Secondary | ICD-10-CM

## 2023-02-16 DIAGNOSIS — E2839 Other primary ovarian failure: Secondary | ICD-10-CM

## 2023-02-16 DIAGNOSIS — R7303 Prediabetes: Secondary | ICD-10-CM

## 2023-02-16 DIAGNOSIS — Z6833 Body mass index (BMI) 33.0-33.9, adult: Secondary | ICD-10-CM

## 2023-02-16 MED ORDER — AMLODIPINE BESYLATE 5 MG PO TABS: 5.0000 mg | ORAL_TABLET | Freq: Every day | ORAL | 3 refills | Status: DC

## 2023-02-16 MED ORDER — ATORVASTATIN CALCIUM 20 MG PO TABS: 20.0000 mg | ORAL_TABLET | Freq: Every day | ORAL | 3 refills | Status: DC

## 2023-02-16 MED ORDER — LOSARTAN POTASSIUM-HCTZ 100-12.5 MG PO TABS: ORAL_TABLET | ORAL | 3 refills | Status: DC

## 2023-02-16 MED ORDER — PANTOPRAZOLE SODIUM 20 MG PO TBEC: 20.0000 mg | DELAYED_RELEASE_TABLET | Freq: Every day | ORAL | 3 refills | Status: DC

## 2023-02-16 MED ORDER — ALENDRONATE SODIUM 70 MG PO TABS: 70.0000 mg | ORAL_TABLET | ORAL | 3 refills | Status: DC

## 2023-02-16 NOTE — Progress Notes (Unsigned)
Subjective:    Patient ID: Natasha [name], female    DOB: 07-Apr-1946, 77 y.o.   MRN: [id number]  HPI Here for health maintenance exam and to review chronic medical problems    Wt Readings from Last 3 Encounters:  02/16/23 192 lb 8 oz (87.3 kg)  06/05/22 193 lb 1.6 oz (87.6 kg)  06/01/22 189 lb (85.7 kg)   33.04 kg/m  Immunization History  Administered Date(s) Administered   Fluad Quad(high Dose 65+) 08/28/2019   Influenza Split 09/24/2012   Influenza, High Dose Seasonal PF 10/03/2016, 09/25/2018, 09/23/2021   Influenza,inj,Quad PF,6+ Mos 10/28/2013, 10/14/2014   Influenza-Unspecified 09/06/2015, 09/11/2017, 09/17/2020, 08/22/2022   PFIZER(Purple Top)SARS-COV-2 Vaccination 04/01/2020, 04/22/2020, 11/02/2020   Pneumococcal Conjugate-13 11/17/2014   Pneumococcal Polysaccharide-23 10/28/2013   Td 12/11/2002   Tdap 03/06/2014   Zoster Recombinat (Shingrix) 10/07/2020, 12/08/2020   Health Maintenance Due  Topic Date Due   Medicare Annual Wellness (AWV)  12/13/2022   Vitals:   02/16/23 1020  BP: 134/72  Pulse: 66  Temp: (!) 97.3 F (36.3 C)  SpO2: 99%   Taking care of 77 yo husb with alzheimer's/ keeps falling  He is home  No help  Daughter lives with them and helps at night    Mammogram 04/2022 at [facility] Self breast exam: no lumps   Dexa 04/2021 osteopenia at [place] Was given info on alendronate  Falls: none Fractures:none  Supplements taking vit D  Exercise : is active In season walks 3 mi per day   Cologaurd 10/2021  neg /utd   HTN bp is stable today  No cp or palpitations or headaches or edema  No side effects to medicines  BP Readings from Last 3 Encounters:  02/16/23 134/72  07/19/22 137/72  06/05/22 (!) 143/63    Losartan hct 100-12.5 mg daily  Amlodipine 5 mg daily   Lab Results  Component Value Date   CREATININE 1.06 02/09/2023   BUN 20 02/09/2023   NA 141 02/09/2023   K 4.3 02/09/2023   CL 105 02/09/2023   CO2 30 02/09/2023   GFR  50.8  Lab Results  Component Value Date   WBC 3.8 (L) 02/09/2023   HGB 13.5 02/09/2023   HCT 40.3 02/09/2023   MCV 85.2 02/09/2023   PLT 149.0 (L) 02/09/2023   Plt ct is up from 135 No issues with bruising or bleeding   GERD Ppi -protonix- has been taking 1/2 of that now and still doing well  Lab Results  Component Value Date   VITAMINB12 1,038 (H) 02/09/2023   Level is high   Hyperlipidemia Lab Results  Component Value Date   CHOL 192 02/09/2023   CHOL 172 12/16/2021   CHOL 163 12/08/2020   Lab Results  Component Value Date   HDL 50.50 02/09/2023   HDL 51.10 12/16/2021   HDL 54.00 12/08/2020   Lab Results  Component Value Date   LDLCALC 118 (H) 02/09/2023   LDLCALC 86 12/16/2021   LDLCALC 88 12/08/2020   Lab Results  Component Value Date   TRIG 119.0 02/09/2023   TRIG 172.0 (H) 12/16/2021   TRIG 107.0 12/08/2020   Lab Results  Component Value Date   CHOLHDL 4 02/09/2023   CHOLHDL 3 12/16/2021   CHOLHDL 3 12/08/2020   No results found for: "LDLDIRECT" Atorvastatin 20 mg daily  Thinks she missed some doses before labs  Diet is the same    Prediabetes Lab Results  Component Value Date   HGBA1C 5.9 02/09/2023  This is stable Diet has been good  No sweet tea  More sweets than she should    Review of Systems     Objective:   Physical Exam        Assessment & Plan:

## 2023-02-16 NOTE — Patient Instructions (Addendum)
Try alendronate weekly for bone density  If well tolerated- will do for 5 year  If any side effects stop it and let me know  Also hold it for any major dental work  Resistance training including lifting is good for bones and brain  Light weights  Exercise bands Chair yoga  Check out options on U tube   Cut your B12 in half and take 1/2 pill daily   I px protonix at 20 mg so you don't have to split it     Be sure not to your atorvastatin doses  Your cholesterol is up  Avoid red meat/ fried foods/ egg yolks/ fatty breakfast meats/ butter, cheese and high fat dairy/ and shellfish    To prevent diabetes Try to get most of your carbohydrates from produce (with the exception of white potatoes)  Eat less bread/pasta/rice/snack foods/cereals/sweets and other items from the middle of the grocery store (processed carbs)

## 2023-02-17 NOTE — Assessment & Plan Note (Signed)
Has cut protonix to 20 mg daily  B12 level is high

## 2023-02-17 NOTE — Assessment & Plan Note (Signed)
Discussed how this problem influences overall health and the risks it imposes  Reviewed plan for weight loss with lower calorie diet (via better food choices and also portion control or program like weight watchers) and exercise building up to or more than 30 minutes 5 days per week including some aerobic activity    

## 2023-02-17 NOTE — Assessment & Plan Note (Signed)
Dexa 5/22 Will call for order when due this may  After discussion choose to try alendronate 70 mg weekly  Disc poss side eff and proper way to take  Disc vit D and exercise

## 2023-02-17 NOTE — Assessment & Plan Note (Signed)
B12 level is high  She did cut dose of ppi Ins to dec B12 dose to 250 mcg daily   Lab Results  Component Value Date   VITAMINB12 1,038 (H) 02/09/2023

## 2023-02-17 NOTE — Assessment & Plan Note (Signed)
bp in fair control at this time  BP Readings from Last 1 Encounters:  02/16/23 134/72   No changes needed Most recent labs reviewed  Disc Losartan hct 100-12.5 mg daily  Amlodipine 5 mg daily lifstyle change with low sodium diet and exercise  Plan to continue

## 2023-02-17 NOTE — Assessment & Plan Note (Signed)
Lab Results  Component Value Date   WBC 3.8 (L) 02/09/2023   HGB 13.5 02/09/2023   HCT 40.3 02/09/2023   MCV 85.2 02/09/2023   PLT 149.0 (L) 02/09/2023   Improved pl count  No clinical changes

## 2023-02-17 NOTE — Assessment & Plan Note (Signed)
Lab Results  Component Value Date   HGBA1C 5.9 02/09/2023   Controlled with diet  disc imp of low glycemic diet and wt loss to prevent DM2

## 2023-02-17 NOTE — Assessment & Plan Note (Signed)
Reviewed health habits including diet and exercise and skin cancer prevention Reviewed appropriate screening tests for age  Also reviewed health mt list, fam hx and immunization status , as well as social and family history   See HPI Labs reviewed  Mammogram utd , due in May  Dexa due in may  Taking vit D, disc plan for exercise  Cologuard utd 10/2021 and neg  Disc stresses of caregiving

## 2023-02-17 NOTE — Assessment & Plan Note (Signed)
Disc goals for lipids and reasons to control them Rev last labs with pt Rev low sat fat diet in detail Diet is usually good (not holidays) Lipid panel today  Taking atorvastatin 20 mg daily- enc to be more compliant with med since LDL is up to 118

## 2023-03-01 ENCOUNTER — Telehealth: Payer: Self-pay | Admitting: Family Medicine

## 2023-03-01 NOTE — Telephone Encounter (Signed)
Contacted [facility] [place] to schedule their annual wellness visit. Appointment made for 03/21/2023.  [place] Direct Dial: [phone]

## 2023-03-21 ENCOUNTER — Ambulatory Visit (INDEPENDENT_AMBULATORY_CARE_PROVIDER_SITE_OTHER): Payer: Medicare Other

## 2023-03-21 VITALS — Ht 64.0 in | Wt 190.0 lb

## 2023-03-21 DIAGNOSIS — Z0000 Encounter for general adult medical examination without abnormal findings: Secondary | ICD-10-CM

## 2023-03-21 NOTE — Progress Notes (Signed)
I connected with  [name] on 03/21/23 by a audio enabled telemedicine application and verified that I am speaking with the correct person using two identifiers.  Patient Location: Home  Provider Location: Office/Clinic  I discussed the limitations of evaluation and management by telemedicine. The patient expressed understanding and agreed to proceed.  Subjective:   Natasha [name] is a 77 y.o. female who presents for Medicare Annual (Subsequent) preventive examination.  Review of Systems      Cardiac Risk Factors include: advanced age (>70men, >59 women);hypertension;sedentary lifestyle     Objective:    Today's Vitals   03/21/23 0904  Weight: 190 lb (86.2 kg)  Height: 5\' 4"  (1.626 m)   Body mass index is 32.61 kg/m.     03/21/2023    9:11 AM 06/05/2022    6:07 AM 12/13/2021    9:49 AM 01/18/2021    6:19 AM 01/12/2021    9:29 AM 12/08/2020    9:43 AM 05/11/2020    6:52 PM  Advanced Directives  Does Patient Have a Medical Advance Directive? Yes Yes Yes Yes Yes Yes Yes  Type of Estate agent of [place];Living will  Healthcare Power of [place];Living will Living will Healthcare Power of [place];Living will Healthcare Power of [place];Living will Healthcare Power of Attorney  Does patient want to make changes to medical advance directive? No - Patient declined No - Patient declined Yes (MAU/Ambulatory/Procedural Areas - Information given) No - Patient declined No - Patient declined  No - Patient declined  Copy of Healthcare Power of Attorney in Chart? Yes - validated most recent copy scanned in chart (See row information)   No - copy requested No - copy requested Yes - validated most recent copy scanned in chart (See row information)     Current Medications (verified) Outpatient Encounter Medications as of 03/21/2023  Medication Sig   alendronate (FOSAMAX) 70 MG tablet Take 1 tablet (70 mg total) by mouth every 7 (seven) days. Take with a full glass of  water on an empty stomach.   amLODipine (NORVASC) 5 MG tablet Take 1 tablet (5 mg total) by mouth daily.   atorvastatin (LIPITOR) 20 MG tablet Take 1 tablet (20 mg total) by mouth daily.   cetirizine (ZYRTEC) 10 MG tablet Take 10 mg by mouth daily.   Cholecalciferol (VITAMIN D-3) 1000 UNITS CAPS Take 2,000 Units by mouth daily.    losartan-hydrochlorothiazide (HYZAAR) 100-12.5 MG tablet TAKE 0.5 TABLET BY MOUTH DAILY.   pantoprazole (PROTONIX) 20 MG tablet Take 1 tablet (20 mg total) by mouth daily.   polyethylene glycol powder (GLYCOLAX/MIRALAX) 17 GM/SCOOP powder Take 17 g by mouth daily. Drink 17g (1 scoop) dissolved in water per day.   vitamin B-12 (CYANOCOBALAMIN) 500 MCG tablet Take 250 mcg by mouth daily.   No facility-administered encounter medications on file as of 03/21/2023.    Allergies (verified) Clindamycin/lincomycin and Naproxen sodium   History: Past Medical History:  Diagnosis Date   Cancer 2010   skin cancer right cheek   DDD (degenerative disc disease), lumbar 11/2006   L5-S1 bulging disk   DDD (degenerative disc disease), lumbar    GERD (gastroesophageal reflux disease)    Headache syndrome 04/30/2017   Left occipital and ear , resolved as of 05/26/2022 per pt   Hyperlipidemia    Hypertension    Osteopenia    Pneumonia    1980's   Pre-diabetes    Spinal stenosis    Thrombocytopenia    mild  Past Surgical History:  Procedure Laterality Date   ANTERIOR AND POSTERIOR REPAIR WITH SACROSPINOUS FIXATION N/A 06/05/2022   Procedure: ANTERIOR  REPAIR;  Surgeon: Marguerita Beards, MD;  Location: [facility];  Service: Gynecology;  Laterality: N/A;   APPENDECTOMY     late 1970's   BACK SURGERY  2004   fall at work. NO surgery, just back injections   BLADDER SUSPENSION N/A 06/05/2022   Procedure: mini sling-Coloplast Altis  (TOT);  Surgeon: Marguerita Beards, MD;  Location: [facility];  Service: Gynecology;  Laterality: N/A;    BREAST BIOPSY Right 2017   core- neg   CHOLECYSTECTOMY     late 1970's   COLONOSCOPY  2005   CYSTOSCOPY N/A 06/05/2022   Procedure: CYSTOSCOPY;  Surgeon: Marguerita Beards, MD;  Location: [facility];  Service: Gynecology;  Laterality: N/A;   SHOULDER SURGERY  2004   fall.  shoulder arthroscopy.  RCR.   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   Procedure: RIGHT TOTAL HIP ARTHROPLASTY ANTERIOR APPROACH;  Surgeon: Kennedy Bucker, MD;  Location: [facility];  Service: Orthopedics;  Laterality: Right;   TOTAL HIP ARTHROPLASTY Left 01/18/2021   Procedure: TOTAL HIP ARTHROPLASTY ANTERIOR APPROACH;  Surgeon: Kennedy Bucker, MD;  Location: [facility];  Service: Orthopedics;  Laterality: Left;   TUBAL LIGATION     late 1970 's   VAGINAL HYSTERECTOMY N/A 06/05/2022   Procedure: HYSTERECTOMY VAGINAL With bilateral salpingo-oophorectomy, uterosacral ligiment suspension;  Surgeon: Marguerita Beards, MD;  Location: [facility];  Service: Gynecology;  Laterality: N/A;  total time requested is 3.5 hours   Family History  Problem Relation Age of Onset   Coronary artery disease Father    Lung cancer Sister    Breast cancer Maternal Grandmother    Diabetes Sister    Other Sister        OP   Other Mother        has pacemaker   Social History   Socioeconomic History   Marital status: Married    Spouse name: Jamyla Montera   Number of children: 4   Years of education: 12   Highest education level: Not on file  Occupational History   Occupation: Chief Financial Officer- Retired    Associate Professor: [employer]  Tobacco Use   Smoking status: Never   Smokeless tobacco: Never  Vaping Use   Vaping Use: Never used  Substance and Sexual Activity   Alcohol use: No    Alcohol/week: 0.0 standard drinks of alcohol   Drug use: No   Sexual activity: Yes    Birth control/protection: Post-menopausal  Other Topics Concern   Not on file  Social History Narrative   Lives with husband,  Peyton Najjar (he is 20 years older than patient. Unable to provide her much assistance after surgery)    All 4 daughters will be helping patient out at home after surgery.   Caffeine use: 1 cup coffee every morning   Soda rare   Social Determinants of Health   Financial Resource Strain: Low Risk  (03/21/2023)   Overall Financial Resource Strain (CARDIA)    Difficulty of Paying Living Expenses: Not hard at all  Food Insecurity: No Food Insecurity (03/21/2023)   Hunger Vital Sign    Worried About Running Out of Food in the Last Year: Never true    Ran Out of Food in the Last Year: Never true  Transportation Needs: No Transportation Needs (03/21/2023)   PRAPARE - Transportation  Lack of Transportation (Medical): No    Lack of Transportation (Non-Medical): No  Physical Activity: Inactive (03/21/2023)   Exercise Vital Sign    Days of Exercise per Week: 0 days    Minutes of Exercise per Session: 0 min  Stress: No Stress Concern Present (03/21/2023)   [employer] of Occupational Health - Occupational Stress Questionnaire    Feeling of Stress : Not at all  Social Connections: Moderately Integrated (03/21/2023)   Social Connection and Isolation Panel [NHANES]    Frequency of Communication with Friends and Family: More than three times a week    Frequency of Social Gatherings with Friends and Family: More than three times a week    Attends Religious Services: More than 4 times per year    Active Member of [employer] or Organizations: No    Attends Engineer, structural: Never    Marital Status: Married    Tobacco Counseling Counseling given: Not Answered   Clinical Intake:  Pre-visit preparation completed: Yes  Pain : No/denies pain     Nutritional Risks: None Diabetes: No  How often do you need to have someone help you when you read instructions, pamphlets, or other written materials from your doctor or pharmacy?: 1 - Never  Diabetic? no  Interpreter Needed?:  No  Information entered by :: C.Vaneta Hammontree LPN   Activities of Daily Living    03/21/2023    9:12 AM 12/11/2022    9:46 PM  In your present state of health, do you have any difficulty performing the following activities:  Hearing? 0 0  Vision? 0 0  Difficulty concentrating or making decisions? 0 0  Walking or climbing stairs? 0 0  Dressing or bathing? 0 0  Doing errands, shopping? 0 0  Preparing Food and eating ? N N  Using the Toilet? N N  In the past six months, have you accidently leaked urine? N N  Do you have problems with loss of bowel control? N N  Managing your Medications? N N  Managing your Finances? N N  Housekeeping or managing your Housekeeping? N N    Patient Care Team: Tower, Audrie Gallus, MD as PCP - General Lew Dawes Sheria Lang, OD as Consulting Physician (Optometry) York Spaniel, MD (Inactive) as Consulting Physician (Neurology) Bartholomew Boards, DDS as Referring Physician (Dentistry)  Indicate any recent Medical Services you may have received from other than [facility] providers in the past year (date may be approximate).     Assessment:   This is a routine wellness examination for Natasha [name].  Hearing/Vision screen Hearing Screening - Comments:: No aids Vision Screening - Comments:: Glasses - Dr.Ritter  Dietary issues and exercise activities discussed: Current Exercise Habits: The patient does not participate in regular exercise at present, Exercise limited by: Other - see comments (Caregiver to husband)   Goals Addressed             This Visit's Progress    Patient Stated       No new goals       Depression Screen    03/21/2023    9:10 AM 02/16/2023   10:23 AM 12/13/2021    9:52 AM 12/08/2020    9:45 AM 12/03/2019    9:48 AM 11/27/2018    8:25 AM 11/26/2017    9:08 AM  PHQ 2/9 Scores  PHQ - 2 Score 0 0 0 0 0 0 0  PHQ- 9 Score 0 0  0 0 0 0    Fall Risk  03/21/2023    9:12 AM 02/16/2023   10:23 AM 12/11/2022    9:46 PM 12/13/2021    9:51 AM 12/08/2020     9:45 AM  Fall Risk   Falls in the past year? 0 0 0 0 0  Number falls in past yr: 0 0  0 0  Injury with Fall? 0 0  0 0  Risk for fall due to : No Fall Risks No Fall Risks  No Fall Risks Medication side effect;Impaired balance/gait  Follow up Falls prevention discussed;Falls evaluation completed Falls evaluation completed  Falls prevention discussed Falls evaluation completed;Falls prevention discussed    FALL RISK PREVENTION PERTAINING TO THE HOME:  Any stairs in or around the home? No  If so, are there any without handrails? No  Home free of loose throw rugs in walkways, pet beds, electrical cords, etc? Yes  Adequate lighting in your home to reduce risk of falls? Yes   ASSISTIVE DEVICES UTILIZED TO PREVENT FALLS:  Life alert? No  Use of a cane, walker or w/c? No  Grab bars in the bathroom? Yes  Shower chair or bench in shower? Yes  Elevated toilet seat or a handicapped toilet? No    Cognitive Function:    12/08/2020    9:48 AM 12/03/2019    9:49 AM 11/27/2018    8:25 AM 11/26/2017    9:08 AM 11/24/2016    9:42 AM  MMSE - Mini Mental State Exam  Orientation to time 5 5 5 5 5   Orientation to Place 5 5 5 5 5   Registration 3 3 3 3 3   Attention/ Calculation 5 5 0 0 0  Recall 3 3 1 3 3   Recall-comments   unable to recall 2 of 3 words    Language- name 2 objects   0 0 0  Language- repeat 1 1 1 1 1   Language- follow 3 step command   3 3 3   Language- read & follow direction   0 0 0  Write a sentence   0 0 0  Copy design   0 0 0  Total score   18 20 20         03/21/2023    9:13 AM  6CIT Screen  What Year? 0 points  What month? 0 points  What time? 0 points  Count back from 20 0 points  Months in reverse 0 points  Repeat phrase 0 points  Total Score 0 points    Immunizations Immunization History  Administered Date(s) Administered   Fluad Quad(high Dose 65+) 08/28/2019   Influenza Split 09/24/2012   Influenza, High Dose Seasonal PF 10/03/2016, 09/25/2018,  09/23/2021   Influenza,inj,Quad PF,6+ Mos 10/28/2013, 10/14/2014   Influenza-Unspecified 09/06/2015, 09/11/2017, 09/17/2020, 08/22/2022   PFIZER(Purple Top)SARS-COV-2 Vaccination 04/01/2020, 04/22/2020, 11/02/2020   Pneumococcal Conjugate-13 11/17/2014   Pneumococcal Polysaccharide-23 10/28/2013   Td 12/11/2002   Tdap 03/06/2014   Zoster Recombinat (Shingrix) 10/07/2020, 12/08/2020    TDAP status: Up to date  Flu Vaccine status: Up to date  Pneumococcal vaccine status: Up to date  Covid-19 vaccine status: Information provided on how to obtain vaccines.   Qualifies for Shingles Vaccine? Yes   Zostavax completed Yes   Shingrix Completed?: Yes  Screening Tests Health Maintenance  Topic Date Due   COVID-19 Vaccine (4 - 2023-24 season) 08/11/2022   MAMMOGRAM  04/29/2023   INFLUENZA VACCINE  07/12/2023   DTaP/Tdap/Td (3 - Td or Tdap) 03/06/2024   Medicare Annual Wellness (AWV)  03/20/2024  Pneumonia Vaccine 4065+ Years old  Completed   DEXA SCAN  Completed   Hepatitis C Screening  Completed   Zoster Vaccines- Shingrix  Completed   HPV VACCINES  Aged Out   PAP SMEAR-Modifier  Discontinued   Fecal DNA (Cologuard)  Discontinued    Health Maintenance  Health Maintenance Due  Topic Date Due   COVID-19 Vaccine (4 - 2023-24 season) 08/11/2022    Colorectal cancer screening: Type of screening: Cologuard. Completed 01/04/21. Repeat every 3 years  Mammogram status: Completed 04/28/22. Repeat every year Pt will call to schedule.  Bone Density status: Completed 04/13/21. Results reflect: Bone density results: OSTEOPENIA. Repeat every 2 years.  Lung Cancer Screening: (Low Dose CT Chest recommended if Age 43-80 years, 30 pack-year currently smoking OR have quit w/in 15years.) does not qualify.   Lung Cancer Screening Referral: no  Additional Screening:  Hepatitis C Screening: does qualify; Completed 11/24/16  Vision Screening: Recommended annual ophthalmology exams for early  detection of glaucoma and other disorders of the eye. Is the patient up to date with their annual eye exam?  Yes  Who is the provider or what is the name of the office in which the patient attends annual eye exams? Dr.Ritter If pt is not established with a provider, would they like to be referred to a provider to establish care? No .   Dental Screening: Recommended annual dental exams for proper oral hygiene  Community Resource Referral / Chronic Care Management: CRR required this visit?  No   CCM required this visit?  No      Plan:     I have personally reviewed and noted the following in the patient's chart:   Medical and social history Use of alcohol, tobacco or illicit drugs  Current medications and supplements including opioid prescriptions. Patient is not currently taking opioid prescriptions. Functional ability and status Nutritional status Physical activity Advanced directives List of other physicians Hospitalizations, surgeries, and ER visits in previous 12 months Vitals Screenings to include cognitive, depression, and falls Referrals and appointments  In addition, I have reviewed and discussed with patient certain preventive protocols, quality metrics, and best practice recommendations. A written personalized care plan for preventive services as well as general preventive health recommendations were provided to patient.     Maryan PulsChristan M Enjoli Tidd, LPN   4/09/81194/09/2023   Nurse Notes: none

## 2023-03-21 NOTE — Patient Instructions (Addendum)
[name] , Thank you for taking time to come for your Medicare Wellness Visit. I appreciate your ongoing commitment to your health goals. Please review the following plan we discussed and let me know if I can assist you in the future.   These are the goals we discussed:  Goals      Follow up with Primary Care Provider     Starting 11/27/2018, I will continue to take medications as prescribed and to follow up with PCP as scheduled.      Patient Stated     12/03/2019, I will continue to eat more healthier and try to lose some weight.      Patient Stated     12/08/2020, I will maintain and continue medications as prescribed.      Patient Stated     Would like to exercise and drink more water     Patient Stated     No new goals        This is a list of the screening recommended for you and due dates:  Health Maintenance  Topic Date Due   COVID-19 Vaccine (4 - 2023-24 season) 08/11/2022   Mammogram  04/29/2023   Flu Shot  07/12/2023   DTaP/Tdap/Td vaccine (3 - Td or Tdap) 03/06/2024   Medicare Annual Wellness Visit  03/20/2024   Pneumonia Vaccine  Completed   DEXA scan (bone density measurement)  Completed   Hepatitis C Screening: USPSTF Recommendation to screen - Ages 4118-79 yo.  Completed   Zoster (Shingles) Vaccine  Completed   HPV Vaccine  Aged Out   Pap Smear  Discontinued   Cologuard (Stool DNA test)  Discontinued    Advanced directives: Documents on file in the chart.  Conditions/risks identified: Aim for 30 minutes of exercise or brisk walking, 6-8 glasses of water, and 5 servings of fruits and vegetables each day.   Next appointment: Follow up in one year for your annual wellness visit 03/24/24 @ 9:00  televist   Preventive Care 65 Years and Older, Female Preventive care refers to lifestyle choices and visits with your health care provider that can promote health and wellness. What does preventive care include? A yearly physical exam. This is also called an  annual well check. Dental exams once or twice a year. Routine eye exams. Ask your health care provider how often you should have your eyes checked. Personal lifestyle choices, including: Daily care of your teeth and gums. Regular physical activity. Eating a healthy diet. Avoiding tobacco and drug use. Limiting alcohol use. Practicing safe sex. Taking low-dose aspirin every day. Taking vitamin and mineral supplements as recommended by your health care provider. What happens during an annual well check? The services and screenings done by your health care provider during your annual well check will depend on your age, overall health, lifestyle risk factors, and family history of disease. Counseling  Your health care provider may ask you questions about your: Alcohol use. Tobacco use. Drug use. Emotional well-being. Home and relationship well-being. Sexual activity. Eating habits. History of falls. Memory and ability to understand (cognition). Work and work Astronomerenvironment. Reproductive health. Screening  You may have the following tests or measurements: Height, weight, and BMI. Blood pressure. Lipid and cholesterol levels. These may be checked every 5 years, or more frequently if you are over 77 years old. Skin check. Lung cancer screening. You may have this screening every year starting at age 77 if you have a 30-pack-year history of smoking and currently smoke  or have quit within the past 15 years. Fecal occult blood test (FOBT) of the stool. You may have this test every year starting at age 17. Flexible sigmoidoscopy or colonoscopy. You may have a sigmoidoscopy every 5 years or a colonoscopy every 10 years starting at age 11. Hepatitis C blood test. Hepatitis B blood test. Sexually transmitted disease (STD) testing. Diabetes screening. This is done by checking your blood sugar (glucose) after you have not eaten for a while (fasting). You may have this done every 1-3 years. Bone  density scan. This is done to screen for osteoporosis. You may have this done starting at age 48. Mammogram. This may be done every 1-2 years. Talk to your health care provider about how often you should have regular mammograms. Talk with your health care provider about your test results, treatment options, and if necessary, the need for more tests. Vaccines  Your health care provider may recommend certain vaccines, such as: Influenza vaccine. This is recommended every year. Tetanus, diphtheria, and acellular pertussis (Tdap, Td) vaccine. You may need a Td booster every 10 years. Zoster vaccine. You may need this after age 56. Pneumococcal 13-valent conjugate (PCV13) vaccine. One dose is recommended after age 58. Pneumococcal polysaccharide (PPSV23) vaccine. One dose is recommended after age 53. Talk to your health care provider about which screenings and vaccines you need and how often you need them. This information is not intended to replace advice given to you by your health care provider. Make sure you discuss any questions you have with your health care provider. Document Released: 12/24/2015 Document Revised: 08/16/2016 Document Reviewed: 09/28/2015 Elsevier Interactive Patient Education  2017 [employer].  Fall Prevention in the Home Falls can cause injuries. They can happen to people of all ages. There are many things you can do to make your home safe and to help prevent falls. What can I do on the outside of my home? Regularly fix the edges of walkways and driveways and fix any cracks. Remove anything that might make you trip as you walk through a door, such as a raised step or threshold. Trim any bushes or trees on the path to your home. Use bright outdoor lighting. Clear any walking paths of anything that might make someone trip, such as rocks or tools. Regularly check to see if handrails are loose or broken. Make sure that both sides of any steps have handrails. Any raised  decks and porches should have guardrails on the edges. Have any leaves, snow, or ice cleared regularly. Use sand or salt on walking paths during winter. Clean up any spills in your garage right away. This includes oil or grease spills. What can I do in the bathroom? Use night lights. Install grab bars by the toilet and in the tub and shower. Do not use towel bars as grab bars. Use non-skid mats or decals in the tub or shower. If you need to sit down in the shower, use a plastic, non-slip stool. Keep the floor dry. Clean up any water that spills on the floor as soon as it happens. Remove soap buildup in the tub or shower regularly. Attach bath mats securely with double-sided non-slip rug tape. Do not have throw rugs and other things on the floor that can make you trip. What can I do in the bedroom? Use night lights. Make sure that you have a light by your bed that is easy to reach. Do not use any sheets or blankets that are too big for  your bed. They should not hang down onto the floor. Have a firm chair that has side arms. You can use this for support while you get dressed. Do not have throw rugs and other things on the floor that can make you trip. What can I do in the kitchen? Clean up any spills right away. Avoid walking on wet floors. Keep items that you use a lot in easy-to-reach places. If you need to reach something above you, use a strong step stool that has a grab bar. Keep electrical cords out of the way. Do not use floor polish or wax that makes floors slippery. If you must use wax, use non-skid floor wax. Do not have throw rugs and other things on the floor that can make you trip. What can I do with my stairs? Do not leave any items on the stairs. Make sure that there are handrails on both sides of the stairs and use them. Fix handrails that are broken or loose. Make sure that handrails are as long as the stairways. Check any carpeting to make sure that it is firmly attached  to the stairs. Fix any carpet that is loose or worn. Avoid having throw rugs at the top or bottom of the stairs. If you do have throw rugs, attach them to the floor with carpet tape. Make sure that you have a light switch at the top of the stairs and the bottom of the stairs. If you do not have them, ask someone to add them for you. What else can I do to help prevent falls? Wear shoes that: Do not have high heels. Have rubber bottoms. Are comfortable and fit you well. Are closed at the toe. Do not wear sandals. If you use a stepladder: Make sure that it is fully opened. Do not climb a closed stepladder. Make sure that both sides of the stepladder are locked into place. Ask someone to hold it for you, if possible. Clearly mark and make sure that you can see: Any grab bars or handrails. First and last steps. Where the edge of each step is. Use tools that help you move around (mobility aids) if they are needed. These include: Canes. Walkers. Scooters. Crutches. Turn on the lights when you go into a dark area. Replace any light bulbs as soon as they burn out. Set up your furniture so you have a clear path. Avoid moving your furniture around. If any of your floors are uneven, fix them. If there are any pets around you, be aware of where they are. Review your medicines with your doctor. Some medicines can make you feel dizzy. This can increase your chance of falling. Ask your doctor what other things that you can do to help prevent falls. This information is not intended to replace advice given to you by your health care provider. Make sure you discuss any questions you have with your health care provider. Document Released: 09/23/2009 Document Revised: 05/04/2016 Document Reviewed: 01/01/2015 Elsevier Interactive Patient Education  2017 [employer].

## 2023-04-17 ENCOUNTER — Telehealth: Payer: Self-pay | Admitting: Family Medicine

## 2023-04-17 DIAGNOSIS — Z1231 Encounter for screening mammogram for malignant neoplasm of breast: Secondary | ICD-10-CM | POA: Insufficient documentation

## 2023-04-17 DIAGNOSIS — E2839 Other primary ovarian failure: Secondary | ICD-10-CM

## 2023-04-17 NOTE — Addendum Note (Signed)
Addended by: Roxy Manns A on: 04/17/2023 08:01 PM   Modules accepted: Orders

## 2023-04-17 NOTE — Telephone Encounter (Signed)
Patient called in and stated that her bone density scan and mammogram are due. Thank you!

## 2023-04-17 NOTE — Telephone Encounter (Signed)
Orders are in  She can call [facility] to schedule

## 2023-04-18 NOTE — Telephone Encounter (Signed)
Left message on voicemail for patient to call the office back. 

## 2023-04-18 NOTE — Telephone Encounter (Signed)
Patient called in returning call she received. Relayed message below and she stated that she will call them. Thank you!

## 2023-05-31 ENCOUNTER — Encounter: Payer: Self-pay | Admitting: *Deleted

## 2023-05-31 ENCOUNTER — Ambulatory Visit
Admission: RE | Admit: 2023-05-31 | Discharge: 2023-05-31 | Disposition: A | Discharge status: Home / Self Care | Payer: Medicare Other | Source: Ambulatory Visit | Attending: Family Medicine | Admitting: Family Medicine

## 2023-05-31 DIAGNOSIS — Z1231 Encounter for screening mammogram for malignant neoplasm of breast: Secondary | ICD-10-CM | POA: Insufficient documentation

## 2023-05-31 DIAGNOSIS — E2839 Other primary ovarian failure: Secondary | ICD-10-CM | POA: Insufficient documentation

## 2023-11-15 ENCOUNTER — Telehealth: Payer: Self-pay

## 2023-11-15 NOTE — Patient Outreach (Signed)
  Care Coordination   11/15/2023 Name: MALISSIA HAGEMEISTER MRN: [id number] DOB: [birth year]   Care Coordination Outreach Attempts:  An unsuccessful telephone outreach was attempted today to offer the patient information about available care coordination services. HIPAA compliant message left.   Follow Up Plan:  Additional outreach attempts will be made to offer the patient care coordination information and services.   Encounter Outcome:  No Answer   Care Coordination Interventions:  No, not indicated    George Ina RN,BSN,CCM [facility]  [facility] coordinator / Case Manager Phone: [phone]

## 2023-12-14 ENCOUNTER — Other Ambulatory Visit: Payer: Self-pay | Admitting: Family Medicine

## 2024-02-12 ENCOUNTER — Other Ambulatory Visit: Payer: Self-pay | Admitting: Family Medicine

## 2024-02-13 NOTE — Telephone Encounter (Signed)
 Pt is due for her CPE on or after 02/17/24, please schedule and then route back to me to refill

## 2024-02-13 NOTE — Telephone Encounter (Signed)
 Called  pt and schedule a appt for cpe

## 2024-03-16 ENCOUNTER — Other Ambulatory Visit: Payer: Self-pay | Admitting: Family Medicine

## 2024-03-21 ENCOUNTER — Ambulatory Visit (INDEPENDENT_AMBULATORY_CARE_PROVIDER_SITE_OTHER): Admitting: Family Medicine

## 2024-03-21 VITALS — BP 131/60 | HR 56 | Temp 97.6°F | Ht 64.0 in | Wt 207.1 lb

## 2024-03-21 DIAGNOSIS — Z1211 Encounter for screening for malignant neoplasm of colon: Secondary | ICD-10-CM

## 2024-03-21 DIAGNOSIS — R7303 Prediabetes: Secondary | ICD-10-CM

## 2024-03-21 DIAGNOSIS — E7800 Pure hypercholesterolemia, unspecified: Secondary | ICD-10-CM

## 2024-03-21 DIAGNOSIS — I10 Essential (primary) hypertension: Secondary | ICD-10-CM

## 2024-03-21 DIAGNOSIS — Z0000 Encounter for general adult medical examination without abnormal findings: Secondary | ICD-10-CM | POA: Diagnosis not present

## 2024-03-21 DIAGNOSIS — D696 Thrombocytopenia, unspecified: Secondary | ICD-10-CM | POA: Diagnosis not present

## 2024-03-21 DIAGNOSIS — Z79899 Other long term (current) drug therapy: Secondary | ICD-10-CM

## 2024-03-21 DIAGNOSIS — M8589 Other specified disorders of bone density and structure, multiple sites: Secondary | ICD-10-CM | POA: Diagnosis not present

## 2024-03-21 DIAGNOSIS — E538 Deficiency of other specified B group vitamins: Secondary | ICD-10-CM

## 2024-03-21 DIAGNOSIS — Z131 Encounter for screening for diabetes mellitus: Secondary | ICD-10-CM | POA: Insufficient documentation

## 2024-03-21 NOTE — Assessment & Plan Note (Signed)
 B12 added to labs   Level high last time and cut dose

## 2024-03-21 NOTE — Patient Instructions (Addendum)
 Check in with pharmacist about a tetanus shot   Stay active Add some strength training to your routine, this is important for bone and brain health and can reduce your risk of falls and help your body use insulin properly and regulate weight  Light weights, exercise bands , and internet videos are a good way to start  Yoga (chair or regular), machines , floor exercises or a gym with machines are also good options    I ordered cologuard test  If you don't hear from the company in a week let us know   Labs today    Your mammogram is due late June

## 2024-03-21 NOTE — Assessment & Plan Note (Signed)
 B12 added to labs

## 2024-03-21 NOTE — Assessment & Plan Note (Signed)
A1c added to labs disc imp of low glycemic diet and wt loss to prevent DM2

## 2024-03-21 NOTE — Progress Notes (Signed)
 Subjective:    Patient ID: Natasha [name], female    DOB: 01-Apr-1946, 78 y.o.   MRN: [id number]  HPI  Here for health maintenance exam and to review chronic medical problems   Wt Readings from Last 3 Encounters:  03/21/24 207 lb 2 oz (94 kg)  03/21/23 190 lb (86.2 kg)  02/16/23 192 lb 8 oz (87.3 kg)   35.55 kg/m  Vitals:   03/21/24 1403 03/21/24 1427  BP: (!) 146/78 131/60  Pulse: (!) 56   Temp: 97.6 F (36.4 C)   SpO2: 95%     Immunization History  Administered Date(s) Administered   Fluad Quad(high Dose 65+) 08/28/2019   Influenza Split 09/24/2012   Influenza, High Dose Seasonal PF 10/03/2016, 09/25/2018, 09/23/2021   Influenza,inj,Quad PF,6+ Mos 10/28/2013, 10/14/2014   Influenza-Unspecified 09/06/2015, 09/11/2017, 09/17/2020, 08/22/2022   PFIZER(Purple Top)SARS-COV-2 Vaccination 04/01/2020, 04/22/2020, 11/02/2020   Pneumococcal Conjugate-13 11/17/2014   Pneumococcal Polysaccharide-23 10/28/2013   Td 12/11/2002   Tdap 03/06/2014   Zoster Recombinant(Shingrix) 10/07/2020, 12/08/2020    There are no preventive care reminders to display for this patient.  Hard year   A lot going on  Husband passed away - 8 months  Trouble sleeping but doing well mood wise  Lots of support  Settling into new normal  Starting to think about herself now   More sewing  Lot of yard work    Tetanus shot -needs    [facility]ammogram 05/2023 at [facility]  Self breast exam  Gyn health No bleeding or problems     Colon cancer screening  Cologuard neg 10/2021   Bone health  Dexa  05/2023   Osteopenia  Taking alendronate  Falls-none Fractures-none  Supplements  Last vitamin D Lab Results  Component Value Date   VD25OH 44.69 11/26/2017    Exercise  Yardwork  PT for her back / was seeing Dr Jonnie Finner (also gets adjustments)  Walking at mall    Mood    03/21/2024    2:36 PM 03/21/2023    9:10 AM 02/16/2023   10:23 AM 12/13/2021    9:52 AM 12/08/2020    9:45 AM  Depression  screen PHQ 2/9  Decreased Interest 0 0 0 0 0  Down, Depressed, Hopeless 0 0 0 0 0  PHQ - 2 Score 0 0 0 0 0  Altered sleeping 1 0 0  0  Tired, decreased energy 0 0 0  0  Change in appetite 1 0 0  0  Feeling bad or failure about yourself  0 0 0  0  Trouble concentrating 0 0 0  0  Moving slowly or fidgety/restless 0 0 0  0  Suicidal thoughts 0 0 0  0  PHQ-9 Score 2 0 0  0  Difficult doing work/chores Not difficult at all Not difficult at all Not difficult at all  Not difficult at all    HTN bp is stable today  No cp or palpitations or headaches or edema  No side effects to medicines  BP Readings from Last 3 Encounters:  03/21/24 131/60  02/16/23 134/72  07/19/22 137/72    Losartan hct 100-12.5 mg daily  Amlodipine 5 mg daily   Good at home   Lab Results  Component Value Date   NA 141 02/09/2023   K 4.3 02/09/2023   CO2 30 02/09/2023   GLUCOSE 94 02/09/2023   BUN 20 02/09/2023   CREATININE 1.06 02/09/2023   CALCIUM 10.2 02/09/2023   GFR 50.83 (L) 02/09/2023  GFRNONAA 55 (L) 06/01/2022   Due for labs   Lab Results  Component Value Date   WBC 3.8 (L) 02/09/2023   HGB 13.5 02/09/2023   HCT 40.3 02/09/2023   MCV 85.2 02/09/2023   PLT 149.0 (L) 02/09/2023   History of low platelets- monitoring  No excess bleeding/bruising    Takes protonix 20 mg daily  Lab Results  Component Value Date   VITAMINB12 1,038 (H) 02/09/2023    Hyperlipidemia Lab Results  Component Value Date   CHOL 192 02/09/2023   CHOL 172 12/16/2021   CHOL 163 12/08/2020   Lab Results  Component Value Date   HDL 50.50 02/09/2023   HDL 51.10 12/16/2021   HDL 54.00 12/08/2020   Lab Results  Component Value Date   LDLCALC 118 (H) 02/09/2023   LDLCALC 86 12/16/2021   LDLCALC 88 12/08/2020   Lab Results  Component Value Date   TRIG 119.0 02/09/2023   TRIG 172.0 (H) 12/16/2021   TRIG 107.0 12/08/2020   Lab Results  Component Value Date   CHOLHDL 4 02/09/2023   CHOLHDL 3  12/16/2021   CHOLHDL 3 12/08/2020   No results found for: "LDLDIRECT"  Atorvastatin 20 mg daily   Appetite is up and down with grief  Tries to eat healthy  Avoids red meat and greasy foods     Patient Active Problem List   Diagnosis Date Noted   Diabetes mellitus screening 03/21/2024   Encounter for screening mammogram for breast cancer 04/17/2023   Uterovaginal prolapse, incomplete 06/05/2022   Vitamin B12 deficiency 12/17/2021   Current use of proton pump inhibitor 12/16/2021   Status post total hip replacement, right 05/11/2020   Routine general medical examination at a health care facility 11/22/2015   Colon cancer screening 11/22/2015   Special screening for malignant neoplasms, colon 11/17/2014   Estrogen deficiency 11/17/2014   Prediabetes 10/28/2013   Class 2 severe obesity with body mass index (BMI) of 35 to 39.9 with serious comorbidity (HCC) 10/28/2013   Allergic rhinitis 04/26/2012   Osteopenia 11/10/2008   THROMBOCYTOPENIA 08/11/2008   HYPERCHOLESTEROLEMIA 07/16/2007   Essential hypertension 07/16/2007   DEGENERATIVE DISC DISEASE, LUMBAR SPINE 07/16/2007   Past Medical History:  Diagnosis Date   Cancer (HCC) 2010   skin cancer right cheek   DDD (degenerative disc disease), lumbar 11/2006   L5-S1 bulging disk   DDD (degenerative disc disease), lumbar    GERD (gastroesophageal reflux disease)    Headache syndrome 04/30/2017   Left occipital and ear , resolved as of 05/26/2022 per pt   Hyperlipidemia    Hypertension    Osteopenia    Pneumonia    1980's   Pre-diabetes    Spinal stenosis    Thrombocytopenia (HCC)    mild   Past Surgical History:  Procedure Laterality Date   ANTERIOR AND POSTERIOR REPAIR WITH SACROSPINOUS FIXATION N/A 06/05/2022   Procedure: ANTERIOR  REPAIR;  Surgeon: Marguerita Beards, MD;  Location: [facility];  Service: Gynecology;  Laterality: N/A;   APPENDECTOMY     late 1970's   BACK SURGERY  2004   fall at  work. NO surgery, just back injections   BLADDER SUSPENSION N/A 06/05/2022   Procedure: mini sling-Coloplast [place]is  (TOT);  Surgeon: Marguerita Beards, MD;  Location: [facility];  Service: Gynecology;  Laterality: N/A;   BREAST BIOPSY Right 2017   core- neg   CHOLECYSTECTOMY     late 1970's   COLONOSCOPY  2005   CYSTOSCOPY N/A 06/05/2022   Procedure: CYSTOSCOPY;  Surgeon: Marguerita Beards, MD;  Location: [facility];  Service: Gynecology;  Laterality: N/A;   SHOULDER SURGERY  2004   fall.  shoulder arthroscopy.  RCR.   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   Procedure: RIGHT TOTAL HIP ARTHROPLASTY ANTERIOR APPROACH;  Surgeon: Kennedy Bucker, MD;  Location: [facility];  Service: Orthopedics;  Laterality: Right;   TOTAL HIP ARTHROPLASTY Left 01/18/2021   Procedure: TOTAL HIP ARTHROPLASTY ANTERIOR APPROACH;  Surgeon: Kennedy Bucker, MD;  Location: [facility];  Service: Orthopedics;  Laterality: Left;   TUBAL LIGATION     late 1970 's   VAGINAL HYSTERECTOMY N/A 06/05/2022   Procedure: HYSTERECTOMY VAGINAL With bilateral salpingo-oophorectomy, uterosacral ligiment suspension;  Surgeon: Marguerita Beards, MD;  Location: [facility];  Service: Gynecology;  Laterality: N/A;  total time requested is 3.5 hours   Social History   Tobacco Use   Smoking status: Never   Smokeless tobacco: Never  Vaping Use   Vaping status: Never Used  Substance Use Topics   Alcohol use: No    Alcohol/week: 0.0 standard drinks of alcohol   Drug use: No   Family History  Problem Relation Age of Onset   Coronary artery disease Father    Lung cancer Sister    Breast cancer Maternal Grandmother    Diabetes Sister    Other Sister        OP   Other Mother        has pacemaker   Allergies  Allergen Reactions   Clindamycin/Lincomycin Itching and Rash   Naproxen Sodium     stomach pain.  Irritated stomach horribly.   Current Outpatient Medications on File  Prior to Visit  Medication Sig Dispense Refill   alendronate (FOSAMAX) 70 MG tablet TAKE 1 TABLET EVERY 7 DAYS WITH A FULL GLASS OF WATER ON AN EMPTY STOMACH DO NOT LIE DOWN FOR AT LEAST 30 MIN 12 tablet 0   amLODipine (NORVASC) 5 MG tablet TAKE 1 TABLET BY MOUTH ONCE DAILY 90 tablet 0   atorvastatin (LIPITOR) 20 MG tablet TAKE 1 TABLET BY MOUTH ONCE DAILY 90 tablet 0   cetirizine (ZYRTEC) 10 MG tablet Take 10 mg by mouth daily.     Cholecalciferol (VITAMIN D-3) 1000 UNITS CAPS Take 2,000 Units by mouth daily.      losartan-hydrochlorothiazide (HYZAAR) 100-12.5 MG tablet TAKE 1/2 TABLET BY MOUTH ONCE DAILY 45 tablet 0   pantoprazole (PROTONIX) 20 MG tablet TAKE 1 TABLET BY MOUTH ONCE DAILY 90 tablet 0   polyethylene glycol powder (GLYCOLAX/MIRALAX) 17 GM/SCOOP powder Take 17 g by mouth daily. Drink 17g (1 scoop) dissolved in water per day. 255 g 0   vitamin B-12 (CYANOCOBALAMIN) 500 MCG tablet Take 250 mcg by mouth daily.     No current facility-administered medications on file prior to visit.    Review of Systems  Constitutional:  Negative for activity change, appetite change, fatigue, fever and unexpected weight change.  HENT:  Negative for congestion, ear pain, rhinorrhea, sinus pressure and sore throat.   Eyes:  Negative for pain, redness and visual disturbance.  Respiratory:  Negative for cough, shortness of breath and wheezing.   Cardiovascular:  Negative for chest pain and palpitations.  Gastrointestinal:  Negative for abdominal pain, blood in stool, constipation and diarrhea.  Endocrine: Negative for polydipsia and polyuria.  Genitourinary:  Negative for dysuria, frequency and urgency.  Musculoskeletal:  Positive for arthralgias. Negative  for back pain and myalgias.  Skin:  Negative for pallor and rash.  Allergic/Immunologic: Negative for environmental allergies.  Neurological:  Negative for dizziness, syncope and headaches.  Hematological:  Negative for adenopathy. Does not  bruise/bleed easily.  Psychiatric/Behavioral:  Negative for decreased concentration and dysphoric mood. The patient is not nervous/anxious.        Grief        Objective:   Physical Exam Constitutional:      General: She is not in acute distress.    Appearance: Normal appearance. She is well-developed. She is obese. She is not ill-appearing or diaphoretic.  HENT:     Head: Normocephalic and atraumatic.     Right Ear: Tympanic membrane, ear canal and external ear normal.     Left Ear: Tympanic membrane, ear canal and external ear normal.     Nose: Nose normal. No congestion.     Mouth/Throat:     Mouth: Mucous membranes are moist.     Pharynx: Oropharynx is clear. No posterior oropharyngeal erythema.  Eyes:     General: No scleral icterus.    Extraocular Movements: Extraocular movements intact.     Conjunctiva/sclera: Conjunctivae normal.     Pupils: Pupils are equal, round, and reactive to light.  Neck:     Thyroid: No thyromegaly.     Vascular: No carotid bruit or JVD.  Cardiovascular:     Rate and Rhythm: Normal rate and regular rhythm.     Pulses: Normal pulses.     Heart sounds: Normal heart sounds.     No gallop.  Pulmonary:     Effort: Pulmonary effort is normal. No respiratory distress.     Breath sounds: Normal breath sounds. No wheezing.     Comments: Good air exch Chest:     Chest wall: No tenderness.  Abdominal:     General: Bowel sounds are normal. There is no distension or abdominal bruit.     Palpations: Abdomen is soft. There is no mass.     Tenderness: There is no abdominal tenderness.     Hernia: No hernia is present.  Genitourinary:    Comments: Breast exam: No mass, nodules, thickening, tenderness, bulging, retraction, inflamation, nipple discharge or skin changes noted.  No axillary or clavicular LA.     Musculoskeletal:        General: No tenderness. Normal range of motion.     Cervical back: Normal range of motion and neck supple. No rigidity. No  muscular tenderness.     Right lower leg: No edema.     Left lower leg: No edema.     Comments: No kyphosis   Lymphadenopathy:     Cervical: No cervical adenopathy.  Skin:    General: Skin is warm and dry.     Coloration: Skin is not pale.     Findings: No erythema or rash.     Comments: Solar lentigines diffusely Scattered sks   Neurological:     Mental Status: She is alert. Mental status is at baseline.     Cranial Nerves: No cranial nerve deficit.     Motor: No abnormal muscle tone.     Coordination: Coordination normal.     Gait: Gait normal.     Deep Tendon Reflexes: Reflexes are normal and symmetric. Reflexes normal.  Psychiatric:        Mood and Affect: Mood normal.        Cognition and Memory: Cognition and memory normal.  Assessment & Plan:   Problem List Items Addressed This Visit       Cardiovascular and Mediastinum   Essential hypertension   bp in fair control at this time  BP Readings from Last 1 Encounters:  03/21/24 131/60   No changes needed Most recent labs reviewed  Disc Losartan hct 100-12.5 mg daily  Amlodipine 5 mg daily lifstyle change with low sodium diet and exercise         Relevant Orders   CBC with Differential/Platelet   Comprehensive metabolic panel with GFR   Lipid Panel   TSH     Musculoskeletal and Integument   Osteopenia   Dexa 05/2023  No falls or fracture Continues alendronate   Discussed fall prevention, supplements and exercise for bone density          Hematopoietic and Hemostatic   THROMBOCYTOPENIA   Cbc today  No excess bruising or bleeding       Relevant Orders   CBC with Differential/Platelet     Other   Vitamin B12 deficiency   B12 added to labs   Level high last time and cut dose         Relevant Orders   Vitamin B12   Routine general medical examination at a health care facility - Primary   Reviewed health habits including diet and exercise and skin cancer prevention Reviewed  appropriate screening tests for age  Also reviewed health mt list, fam hx and immunization status , as well as social and family history   See HPI Labs reviewed and ordered Health Maintenance  Topic Date Due   Medicare Annual Wellness Visit  03/20/2024   DTaP/Tdap/Td vaccine (3 - Td or Tdap) 03/21/2025*   COVID-19 Vaccine (4 - 2024-25 season) 04/06/2025*   Mammogram  05/30/2024   Flu Shot  07/11/2024   Pneumonia Vaccine  Completed   DEXA scan (bone density measurement)  Completed   Hepatitis C Screening  Completed   Zoster (Shingles) Vaccine  Completed   HPV Vaccine  Aged Out   Meningitis B Vaccine  Aged Out   Cologuard (Stool DNA test)  Discontinued  *Topic was postponed. The date shown is not the original due date.    Doing well with grief after losing husband  Plans to get Td at pharmacy  Cologuard ordered  Discussed fall prevention, supplements and exercise for bone density  PHQ 0       Prediabetes   A1c today  disc imp of low glycemic diet and wt loss to prevent DM2       HYPERCHOLESTEROLEMIA   Disc goals for lipids and reasons to control them Rev last labs with pt Rev low sat fat diet in detail Diet is usually good (not holidays) Lipid panel today  Taking atorvastatin 20 mg daily       Relevant Orders   Comprehensive metabolic panel with GFR   Lipid Panel   Diabetes mellitus screening   A1c added to labs disc imp of low glycemic diet and wt loss to prevent DM2       Relevant Orders   Hemoglobin A1c   Current use of proton pump inhibitor   B12 added to labs       Relevant Orders   Hemoglobin A1c   Colon cancer screening   Pt would like to do cologuard program again  Is healthy enough for colonoscopy if positive result   Cologuard ordered       Relevant Orders   Cologuard

## 2024-03-21 NOTE — Assessment & Plan Note (Signed)
Disc goals for lipids and reasons to control them Rev last labs with pt Rev low sat fat diet in detail Diet is usually good (not holidays) Lipid panel today  Taking atorvastatin 20 mg daily

## 2024-03-21 NOTE — Assessment & Plan Note (Signed)
 Reviewed health habits including diet and exercise and skin cancer prevention Reviewed appropriate screening tests for age  Also reviewed health mt list, fam hx and immunization status , as well as social and family history   See HPI Labs reviewed and ordered Health Maintenance  Topic Date Due   Medicare Annual Wellness Visit  03/20/2024   DTaP/Tdap/Td vaccine (3 - Td or Tdap) 03/21/2025*   COVID-19 Vaccine (4 - 2024-25 season) 04/06/2025*   Mammogram  05/30/2024   Flu Shot  07/11/2024   Pneumonia Vaccine  Completed   DEXA scan (bone density measurement)  Completed   Hepatitis C Screening  Completed   Zoster (Shingles) Vaccine  Completed   HPV Vaccine  Aged Out   Meningitis B Vaccine  Aged Out   Cologuard (Stool DNA test)  Discontinued  *Topic was postponed. The date shown is not the original due date.    Doing well with grief after losing husband  Plans to get Td at pharmacy  Cologuard ordered  Discussed fall prevention, supplements and exercise for bone density  PHQ 0

## 2024-03-21 NOTE — Assessment & Plan Note (Signed)
 A1c today  disc imp of low glycemic diet and wt loss to prevent DM2

## 2024-03-21 NOTE — Assessment & Plan Note (Signed)
 Dexa 05/2023  No falls or fracture Continues alendronate   Discussed fall prevention, supplements and exercise for bone density

## 2024-03-21 NOTE — Assessment & Plan Note (Signed)
 bp in fair control at this time  BP Readings from Last 1 Encounters:  03/21/24 131/60   No changes needed Most recent labs reviewed  Disc Losartan hct 100-12.5 mg daily  Amlodipine 5 mg daily lifstyle change with low sodium diet and exercise

## 2024-03-21 NOTE — Assessment & Plan Note (Signed)
 Pt would like to do cologuard program again  Is healthy enough for colonoscopy if positive result   Cologuard ordered

## 2024-03-21 NOTE — Assessment & Plan Note (Signed)
 Cbc today  No excess bruising or bleeding

## 2024-03-22 ENCOUNTER — Encounter: Payer: Self-pay | Admitting: Family Medicine

## 2024-03-22 LAB — COMPREHENSIVE METABOLIC PANEL WITH GFR
AG Ratio: 1.7 (calc) (ref 1.0–2.5)
ALT: 12 U/L (ref 6–29)
AST: 18 U/L (ref 10–35)
Albumin: 4.5 g/dL (ref 3.6–5.1)
Alkaline phosphatase (APISO): 77 U/L (ref 37–153)
BUN: 21 mg/dL (ref 7–25)
CO2: 25 mmol/L (ref 20–32)
Calcium: 10.3 mg/dL (ref 8.6–10.4)
Chloride: 104 mmol/L (ref 98–110)
Creat: 0.93 mg/dL (ref 0.60–1.00)
Globulin: 2.6 g/dL (ref 1.9–3.7)
Glucose, Bld: 91 mg/dL (ref 65–99)
Potassium: 3.9 mmol/L (ref 3.5–5.3)
Sodium: 141 mmol/L (ref 135–146)
Total Bilirubin: 0.9 mg/dL (ref 0.2–1.2)
Total Protein: 7.1 g/dL (ref 6.1–8.1)
eGFR: 63 mL/min/{1.73_m2} (ref >=60)

## 2024-03-22 LAB — CBC WITH DIFFERENTIAL/PLATELET
Absolute Lymphocytes: 1490 {cells}/uL (ref 850–3900)
Absolute Monocytes: 371 {cells}/uL (ref 200–950)
Basophils Absolute: 42 {cells}/uL (ref 0–200)
Basophils Relative: 0.9 %
Eosinophils Absolute: 179 {cells}/uL (ref 15–500)
Eosinophils Relative: 3.8 %
HCT: 42.4 % (ref 35.0–45.0)
Hemoglobin: 14.4 g/dL (ref 11.7–15.5)
MCH: 28.9 pg (ref 27.0–33.0)
MCHC: 34 g/dL (ref 32.0–36.0)
MCV: 85.1 fL (ref 80.0–100.0)
MPV: 12.7 fL — ABNORMAL HIGH (ref 7.5–12.5)
Monocytes Relative: 7.9 %
Neutro Abs: 2618 {cells}/uL (ref 1500–7800)
Neutrophils Relative %: 55.7 %
Platelets: 155 10*3/uL (ref 140–400)
RBC: 4.98 10*6/uL (ref 3.80–5.10)
RDW: 13.2 % (ref 11.0–15.0)
Total Lymphocyte: 31.7 %
WBC: 4.7 10*3/uL (ref 3.8–10.8)

## 2024-03-22 LAB — HEMOGLOBIN A1C
Hgb A1c MFr Bld: 6.1 %{Hb} — ABNORMAL HIGH (ref <5.7)
Mean Plasma Glucose: 128 mg/dL
eAG (mmol/L): 7.1 mmol/L

## 2024-03-22 LAB — LIPID PANEL
Cholesterol: 176 mg/dL (ref <200)
HDL: 55 mg/dL (ref >=50)
LDL Cholesterol (Calc): 99 mg/dL
Non-HDL Cholesterol (Calc): 121 mg/dL (ref <130)
Total CHOL/HDL Ratio: 3.2 (calc) (ref <5.0)
Triglycerides: 127 mg/dL (ref <150)

## 2024-03-22 LAB — VITAMIN B12: Vitamin B-12: 1038 pg/mL (ref 200–1100)

## 2024-03-22 LAB — TSH: TSH: 2.26 m[IU]/L (ref 0.40–4.50)

## 2024-03-24 ENCOUNTER — Ambulatory Visit (INDEPENDENT_AMBULATORY_CARE_PROVIDER_SITE_OTHER): Payer: Medicare Other

## 2024-03-24 VITALS — BP 131/60 | Ht 64.0 in | Wt 200.0 lb

## 2024-03-24 DIAGNOSIS — Z0000 Encounter for general adult medical examination without abnormal findings: Secondary | ICD-10-CM

## 2024-03-24 NOTE — Progress Notes (Signed)
 Because this visit was a virtual/telehealth visit,  certain criteria was not obtained, such a blood pressure, CBG if applicable, and timed get up and go. Any medications not marked as "taking" were not mentioned during the medication reconciliation part of the visit. Any vitals not documented were not able to be obtained due to this being a telehealth visit or patient was unable to self-report a recent blood pressure reading due to a lack of equipment at home via telehealth. Vitals that have been documented are verbally provided by the patient.  Subjective:   Natasha [name] is a 78 y.o. who presents for a Medicare Wellness preventive visit.  Visit Complete: Virtual I connected with  Natasha [name] on 03/24/24 by a audio enabled telemedicine application and verified that I am speaking with the correct person using two identifiers.  Patient Location: Home  Provider Location: Home Office  I discussed the limitations of evaluation and management by telemedicine. The patient expressed understanding and agreed to proceed.  Vital Signs: Because this visit was a virtual/telehealth visit, some criteria may be missing or patient reported. Any vitals not documented were not able to be obtained and vitals that have been documented are patient reported.  VideoDeclined- This patient declined Librarian, academic. Therefore the visit was completed with audio only.  Persons Participating in Visit: Patient.  AWV Questionnaire: No: Patient Medicare AWV questionnaire was not completed prior to this visit.  Cardiac Risk Factors include: hypertension;advanced age (>61men, >52 women);obesity (BMI >30kg/m2)     Objective:    Today's Vitals   03/24/24 0939  BP: 131/60  Weight: 200 lb (90.7 kg)  Height: 5\' 4"  (1.626 m)   Body mass index is 34.33 kg/m.     03/24/2024    9:38 AM 03/21/2023    9:11 AM 06/05/2022    6:07 AM 12/13/2021    9:49 AM 01/18/2021    6:19 AM 01/12/2021     9:29 AM 12/08/2020    9:43 AM  Advanced Directives  Does Patient Have a Medical Advance Directive? Yes Yes Yes Yes Yes Yes Yes  Type of Advance Directive Living will Healthcare Power of [place];Living will  Healthcare Power of [place];Living will Living will Healthcare Power of [place];Living will Healthcare Power of [place];Living will  Does patient want to make changes to medical advance directive? No - Patient declined No - Patient declined No - Patient declined Yes (MAU/Ambulatory/Procedural Areas - Information given) No - Patient declined No - Patient declined   Copy of Healthcare Power of Attorney in Chart? No - copy requested Yes - validated most recent copy scanned in chart (See row information)   No - copy requested No - copy requested Yes - validated most recent copy scanned in chart (See row information)    Current Medications (verified) Outpatient Encounter Medications as of 03/24/2024  Medication Sig   alendronate (FOSAMAX) 70 MG tablet TAKE 1 TABLET EVERY 7 DAYS WITH A FULL GLASS OF WATER ON AN EMPTY STOMACH DO NOT LIE DOWN FOR AT LEAST 30 MIN   amLODipine (NORVASC) 5 MG tablet TAKE 1 TABLET BY MOUTH ONCE DAILY   atorvastatin (LIPITOR) 20 MG tablet TAKE 1 TABLET BY MOUTH ONCE DAILY   cetirizine (ZYRTEC) 10 MG tablet Take 10 mg by mouth daily.   Cholecalciferol (VITAMIN D-3) 1000 UNITS CAPS Take 2,000 Units by mouth daily.    losartan-hydrochlorothiazide (HYZAAR) 100-12.5 MG tablet TAKE 1/2 TABLET BY MOUTH ONCE DAILY   pantoprazole (PROTONIX) 20 MG tablet  TAKE 1 TABLET BY MOUTH ONCE DAILY   polyethylene glycol powder (GLYCOLAX/MIRALAX) 17 GM/SCOOP powder Take 17 g by mouth daily. Drink 17g (1 scoop) dissolved in water per day.   vitamin B-12 (CYANOCOBALAMIN) 500 MCG tablet Take 250 mcg by mouth daily.   No facility-administered encounter medications on file as of 03/24/2024.    Allergies (verified) Clindamycin/lincomycin and Naproxen sodium   History: Past Medical History:   Diagnosis Date   Cancer (HCC) 2010   skin cancer right cheek   DDD (degenerative disc disease), lumbar 11/2006   L5-S1 bulging disk   DDD (degenerative disc disease), lumbar    GERD (gastroesophageal reflux disease)    Headache syndrome 04/30/2017   Left occipital and ear , resolved as of 05/26/2022 per pt   Hyperlipidemia    Hypertension    Osteopenia    Pneumonia    1980's   Pre-diabetes    Spinal stenosis    Thrombocytopenia (HCC)    mild   Past Surgical History:  Procedure Laterality Date   ANTERIOR AND POSTERIOR REPAIR WITH SACROSPINOUS FIXATION N/A 06/05/2022   Procedure: ANTERIOR  REPAIR;  Surgeon: Arma Lamp, MD;  Location: [facility];  Service: Gynecology;  Laterality: N/A;   APPENDECTOMY     late 1970's   BACK SURGERY  2004   fall at work. NO surgery, just back injections   BLADDER SUSPENSION N/A 06/05/2022   Procedure: mini sling-Coloplast [place]is  (TOT);  Surgeon: Arma Lamp, MD;  Location: [facility];  Service: Gynecology;  Laterality: N/A;   BREAST BIOPSY Right 2017   core- neg   CHOLECYSTECTOMY     late 1970's   COLONOSCOPY  2005   CYSTOSCOPY N/A 06/05/2022   Procedure: CYSTOSCOPY;  Surgeon: Arma Lamp, MD;  Location: [facility];  Service: Gynecology;  Laterality: N/A;   SHOULDER SURGERY  2004   fall.  shoulder arthroscopy.  RCR.   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   Procedure: RIGHT TOTAL HIP ARTHROPLASTY ANTERIOR APPROACH;  Surgeon: Molli Angelucci, MD;  Location: [facility];  Service: Orthopedics;  Laterality: Right;   TOTAL HIP ARTHROPLASTY Left 01/18/2021   Procedure: TOTAL HIP ARTHROPLASTY ANTERIOR APPROACH;  Surgeon: Molli Angelucci, MD;  Location: [facility];  Service: Orthopedics;  Laterality: Left;   TUBAL LIGATION     late 1970 's   VAGINAL HYSTERECTOMY N/A 06/05/2022   Procedure: HYSTERECTOMY VAGINAL With bilateral salpingo-oophorectomy, uterosacral ligiment suspension;   Surgeon: Arma Lamp, MD;  Location: [facility];  Service: Gynecology;  Laterality: N/A;  total time requested is 3.5 hours   Family History  Problem Relation Age of Onset   Coronary artery disease Father    Lung cancer Sister    Breast cancer Maternal Grandmother    Diabetes Sister    Other Sister        OP   Other Mother        has pacemaker   Social History   Socioeconomic History   Marital status: Married    Spouse name: Fonda Rochon   Number of children: 4   Years of education: 12   Highest education level: 12th grade  Occupational History   Occupation: Chief Financial Officer- Retired    Associate Professor: [employer]  Tobacco Use   Smoking status: Never   Smokeless tobacco: Never  Vaping Use   Vaping status: Never Used  Substance and Sexual Activity   Alcohol use: No    Alcohol/week: 0.0 standard  drinks of alcohol   Drug use: No   Sexual activity: Yes    Birth control/protection: Post-menopausal  Other Topics Concern   Not on file  Social History Narrative   Lives with husband, Peyton Najjar (he is 20 years older than patient. Unable to provide her much assistance after surgery)    All 4 daughters will be helping patient out at home after surgery.   Caffeine use: 1 cup coffee every morning   Soda rare   Social Drivers of Health   Financial Resource Strain: Low Risk  (03/24/2024)   Overall Financial Resource Strain (CARDIA)    Difficulty of Paying Living Expenses: Not hard at all  Food Insecurity: No Food Insecurity (03/24/2024)   Hunger Vital Sign    Worried About Running Out of Food in the Last Year: Never true    Ran Out of Food in the Last Year: Never true  Transportation Needs: No Transportation Needs (03/24/2024)   PRAPARE - Administrator, Civil Service (Medical): No    Lack of Transportation (Non-Medical): No  Physical Activity: Inactive (03/24/2024)   Exercise Vital Sign    Days of Exercise per Week: 1 day    Minutes of Exercise per  Session: 0 min  Stress: Stress Concern Present (03/24/2024)   [employer] of Occupational Health - Occupational Stress Questionnaire    Feeling of Stress : To some extent  Social Connections: Moderately Integrated (03/24/2024)   Social Connection and Isolation Panel [NHANES]    Frequency of Communication with Friends and Family: More than three times a week    Frequency of Social Gatherings with Friends and Family: More than three times a week    Attends Religious Services: More than 4 times per year    Active Member of [employer] or Organizations: Yes    Attends Banker Meetings: More than 4 times per year    Marital Status: Widowed    Tobacco Counseling Counseling given: Not Answered    Clinical Intake:  Pre-visit preparation completed: Yes  Pain : No/denies pain     BMI - recorded: 34.33 Nutritional Status: BMI > 30  Obese Nutritional Risks: None Diabetes: No  Lab Results  Component Value Date   HGBA1C 6.1 (H) 03/21/2024   HGBA1C 5.9 02/09/2023   HGBA1C 5.9 12/16/2021     How often do you need to have someone help you when you read instructions, pamphlets, or other written materials from your doctor or pharmacy?: 1 - Never What is the last grade level you completed in school?: 12th grade  Interpreter Needed?: No  Information entered by :: Estell Harpin   Activities of Daily Living     03/24/2024    9:44 AM  In your present state of health, do you have any difficulty performing the following activities:  Hearing? 0  Vision? 0  Difficulty concentrating or making decisions? 0  Walking or climbing stairs? 0  Dressing or bathing? 0  Doing errands, shopping? 0  Preparing Food and eating ? N  Using the Toilet? N  In the past six months, have you accidently leaked urine? Y  Comment sometimes  Do you have problems with loss of bowel control? N  Managing your Medications? N  Managing your Finances? N  Housekeeping or managing your  Housekeeping? N    Patient Care Team: Tower, Audrie Gallus, MD as PCP - General Lew Dawes, Sheria Lang, OD as Consulting Physician (Optometry) York Spaniel, MD (Inactive) as Consulting Physician (Neurology) Johny Blamer,  Marlinda Mike, DDS as Referring Physician (Dentistry)  Indicate any recent Medical Services you may have received from other than [facility] providers in the past year (date may be approximate).     Assessment:   This is a routine wellness examination for T[place]a.  Hearing/Vision screen Hearing Screening - Comments:: Patient has no hearing issues  Vision Screening - Comments:: Wears glasses   Goals Addressed             This Visit's Progress    Patient Stated   On track    12/03/2019, I will continue to eat more healthier and try to lose some weight.        Depression Screen     03/24/2024    9:45 AM 03/21/2024    2:36 PM 03/21/2023    9:10 AM 02/16/2023   10:23 AM 12/13/2021    9:52 AM 12/08/2020    9:45 AM 12/03/2019    9:48 AM  PHQ 2/9 Scores  PHQ - 2 Score 0 0 0 0 0 0 0  PHQ- 9 Score 0 2 0 0  0 0    Fall Risk     03/21/2024    2:36 PM 03/21/2023    9:12 AM 02/16/2023   10:23 AM 12/11/2022    9:46 PM 12/13/2021    9:51 AM  Fall Risk   Falls in the past year? 0 0 0 0 0  Number falls in past yr: 0 0 0  0  Injury with Fall? 0 0 0  0  Risk for fall due to : No Fall Risks No Fall Risks No Fall Risks  No Fall Risks  Follow up Falls evaluation completed Falls prevention discussed;Falls evaluation completed Falls evaluation completed  Falls prevention discussed    MEDICARE RISK AT HOME:  Medicare Risk at Home Any stairs in or around the home?: Yes If so, are there any without handrails?: No Home free of loose throw rugs in walkways, pet beds, electrical cords, etc?: Yes Adequate lighting in your home to reduce risk of falls?: Yes Life alert?: No Use of a cane, walker or w/c?: No Grab bars in the bathroom?: Yes Shower chair or bench in shower?: Yes Elevated toilet seat or a  handicapped toilet?: No  TIMED UP AND GO:  Was the test performed?  no  Cognitive Function: 6CIT completed    12/08/2020    9:48 AM 12/03/2019    9:49 AM 11/27/2018    8:25 AM 11/26/2017    9:08 AM 11/24/2016    9:42 AM  MMSE - Mini Mental State Exam  Orientation to time 5 5 5 5 5   Orientation to Place 5 5 5 5 5   Registration 3 3 3 3 3   Attention/ Calculation 5 5 0 0 0  Recall 3 3 1 3 3   Recall-comments   unable to recall 2 of 3 words    Language- name 2 objects   0 0 0  Language- repeat 1 1 1 1 1   Language- follow 3 step command   3 3 3   Language- read & follow direction   0 0 0  Write a sentence   0 0 0  Copy design   0 0 0  Total score   18 20 20         03/24/2024    9:41 AM 03/21/2023    9:13 AM  6CIT Screen  What Year? 0 points 0 points  What month? 0 points 0 points  What time?  0 points 0 points  Count back from 20 0 points 0 points  Months in reverse 0 points 0 points  Repeat phrase 0 points 0 points  Total Score 0 points 0 points    Immunizations Immunization History  Administered Date(s) Administered   Fluad Quad(high Dose 65+) 08/28/2019   Influenza Split 09/24/2012   Influenza, High Dose Seasonal PF 10/03/2016, 09/25/2018, 09/23/2021   Influenza,inj,Quad PF,6+ Mos 10/28/2013, 10/14/2014   Influenza-Unspecified 09/06/2015, 09/11/2017, 09/17/2020, 08/22/2022   PFIZER(Purple Top)SARS-COV-2 Vaccination 04/01/2020, 04/22/2020, 11/02/2020   Pneumococcal Conjugate-13 11/17/2014   Pneumococcal Polysaccharide-23 10/28/2013   Td 12/11/2002   Tdap 03/06/2014   Zoster Recombinant(Shingrix) 10/07/2020, 12/08/2020    Screening Tests Health Maintenance  Topic Date Due   DTaP/Tdap/Td (3 - Td or Tdap) 03/21/2025 (Originally 03/06/2024)   COVID-19 Vaccine (4 - 2024-25 season) 04/06/2025 (Originally 08/12/2023)   MAMMOGRAM  05/30/2024   INFLUENZA VACCINE  07/11/2024   Medicare Annual Wellness (AWV)  03/24/2025   Pneumonia Vaccine 79+ Years old  Completed   DEXA  SCAN  Completed   Hepatitis C Screening  Completed   Zoster Vaccines- Shingrix  Completed   HPV VACCINES  Aged Out   Meningococcal B Vaccine  Aged Out   Fecal DNA (Cologuard)  Discontinued    Health Maintenance  There are no preventive care reminders to display for this patient. Health Maintenance Items Addressed:  Additional Screening:  Vision Screening: Recommended annual ophthalmology exams for early detection of glaucoma and other disorders of the eye.  Dental Screening: Recommended annual dental exams for proper oral hygiene  Community Resource Referral / Chronic Care Management: CRR required this visit?  No   CCM required this visit?  No     Plan:     I have personally reviewed and noted the following in the patient's chart:   Medical and social history Use of alcohol, tobacco or illicit drugs  Current medications and supplements including opioid prescriptions. Patient is not currently taking opioid prescriptions. Functional ability and status Nutritional status Physical activity Advanced directives List of other physicians Hospitalizations, surgeries, and ER visits in previous 12 months Vitals Screenings to include cognitive, depression, and falls Referrals and appointments  In addition, I have reviewed and discussed with patient certain preventive protocols, quality metrics, and best practice recommendations. A written personalized care plan for preventive services as well as general preventive health recommendations were provided to patient.     Freeda Jerry, [place]   03/24/2024   After Visit Summary: (MyChart) Due to this being a telephonic visit, the after visit summary with patients personalized plan was offered to patient via MyChart   Notes: Nothing significant to report at this time.

## 2024-03-24 NOTE — Patient Instructions (Signed)
 Natasha [name] , Thank you for taking time to come for your Medicare Wellness Visit. I appreciate your ongoing commitment to your health goals. Please review the following plan we discussed and let me know if I can assist you in the future.   Referrals/Orders/Follow-Ups/Clinician Recommendations: Follow in 1 year for next Annual wellness visit.  This is a list of the screening recommended for you and due dates:  Health Maintenance  Topic Date Due   DTaP/Tdap/Td vaccine (3 - Td or Tdap) 03/21/2025*   COVID-19 Vaccine (4 - 2024-25 season) 04/06/2025*   Mammogram  05/30/2024   Flu Shot  07/11/2024   Medicare Annual Wellness Visit  03/24/2025   Pneumonia Vaccine  Completed   DEXA scan (bone density measurement)  Completed   Hepatitis C Screening  Completed   Zoster (Shingles) Vaccine  Completed   HPV Vaccine  Aged Out   Meningitis B Vaccine  Aged Out   Cologuard (Stool DNA test)  Discontinued  *Topic was postponed. The date shown is not the original due date.    Advanced directives: (Copy Requested) Please bring a copy of your health care power of attorney and living will to the office to be added to your chart at your convenience. You can mail to [facility] [address] [place], [place] [address] or email to [email or web address]  Next Medicare Annual Wellness Visit scheduled for next year: Yes

## 2024-04-07 LAB — COLOGUARD: COLOGUARD: NEGATIVE

## 2024-05-21 ENCOUNTER — Telehealth: Payer: Self-pay | Admitting: Family Medicine

## 2024-05-21 DIAGNOSIS — Z1231 Encounter for screening mammogram for malignant neoplasm of breast: Secondary | ICD-10-CM

## 2024-05-21 NOTE — Telephone Encounter (Signed)
 Copied from CRM [id number]. Topic: General - Other >> May 21, 2024 10:14 AM Natasha [name] wrote: Reason for CRM: Patient called in stating she was unable to schedule bone density because she doesn't have an order from Dr.Tower would like for Dr.Tower to send that over so she can be scheduled for both mammogram and bone density

## 2024-05-21 NOTE — Telephone Encounter (Signed)
 Pt will still need mammogram order if PCP wants her to get one yearly

## 2024-05-21 NOTE — Telephone Encounter (Signed)
 Last DEXA was 05/31/23 (? If needs it yearly or every 2 years)  Last mammogram was also 05/31/23 and [facility]

## 2024-05-21 NOTE — Telephone Encounter (Signed)
 The order is in

## 2024-05-21 NOTE — Addendum Note (Signed)
 Addended by: Deri Fleet A on: 05/21/2024 08:42 PM   Modules accepted: Orders

## 2024-05-21 NOTE — Telephone Encounter (Signed)
 Dexa is every 2 years-not due yet

## 2024-05-22 NOTE — Telephone Encounter (Signed)
 Lvm asking Natasha [name] to call back. Need to notify Natasha [name] Dr Malissa Se placed order for mammogram, so Natasha [name] can now call and get that scheduled.

## 2024-05-23 NOTE — Telephone Encounter (Signed)
 Pt notified DEXA q2 yrs so not due yet, but order for mammogram is in and she can call [facility] to get it scheduled

## 2024-06-11 ENCOUNTER — Other Ambulatory Visit: Payer: Self-pay | Admitting: Family Medicine

## 2024-06-12 ENCOUNTER — Ambulatory Visit
Admission: RE | Admit: 2024-06-12 | Discharge: 2024-06-12 | Disposition: A | Discharge status: Home / Self Care | Source: Ambulatory Visit | Attending: Family Medicine | Admitting: Family Medicine

## 2024-06-12 DIAGNOSIS — Z1231 Encounter for screening mammogram for malignant neoplasm of breast: Secondary | ICD-10-CM | POA: Diagnosis present

## 2024-06-17 ENCOUNTER — Ambulatory Visit: Payer: Self-pay | Admitting: Family Medicine

## 2024-08-18 ENCOUNTER — Ambulatory Visit: Payer: Self-pay

## 2024-08-18 NOTE — Telephone Encounter (Signed)
 Noted. Thanks.

## 2024-08-18 NOTE — Telephone Encounter (Signed)
 Aware, will watch for correspondence Thanks for seeing her

## 2024-08-18 NOTE — Telephone Encounter (Signed)
 Appt scheduled with Dr. Cleatus on Tuesday, will route to him and PCP as a fyi

## 2024-08-18 NOTE — Telephone Encounter (Signed)
 FYI Only or Action Required?: FYI only for provider.  Patient was last seen in primary care on 03/21/2024 by Randeen Laine LABOR, MD.  Called Nurse Triage reporting Breast Mass.  Symptoms began a week ago.  Interventions attempted: Rest, hydration, or home remedies.  Symptoms are: unchanged.  Triage Disposition: See Physician Within 24 Hours  Patient/caregiver understands and will follow disposition?: Yes  Copied from CRM #[id number]. Topic: Clinical - Red Word Triage >> Aug 18, 2024  9:39 AM Natasha [name] wrote: Red Word that prompted transfer to Nurse Triage: Spot on right breast, redness, swelling, pain, found on August 30th Reason for Disposition  [1] Breast looks infected (spreading redness, feels hot or painful to touch) AND [2] no fever  Answer Assessment - Initial Assessment Questions Additional info: 1) Mammogram in June was reported normal.  2) Scheduled with next available requested provider.    1. SYMPTOM: What's the main symptom you're concerned about?  (e.g., lump, nipple discharge, pain, rash)     Redness, swelling, pain 2. LOCATION: Where is the lump located?     Right breast 3. ONSET: When did lump  start?     August 30 4. PRIOR HISTORY: Do you have any history of prior problems with your breasts? (e.g., breast cancer, breast implant, fibrocystic breast disease)     denies 5. CAUSE: What do you think is causing this symptom?     unsure 6. OTHER SYMPTOMS: Do you have any other symptoms? (e.g., breast pain, fever, nipple discharge, redness or rash)     denies 7. PREGNANCY-BREASTFEEDING: Is there any chance you are pregnant? When was your last menstrual period? Are you breastfeeding?  Protocols used: Breast Symptoms-A-AH

## 2024-08-19 ENCOUNTER — Encounter: Payer: Self-pay | Admitting: Family Medicine

## 2024-08-19 ENCOUNTER — Ambulatory Visit: Admitting: Family Medicine

## 2024-08-19 VITALS — BP 144/82 | HR 65 | Ht 64.0 in | Wt 212.0 lb

## 2024-08-19 DIAGNOSIS — N61 Mastitis without abscess: Secondary | ICD-10-CM

## 2024-08-19 MED ORDER — DOXYCYCLINE HYCLATE 100 MG PO TABS: 100.0000 mg | ORAL_TABLET | Freq: Two times a day (BID) | ORAL | 0 refills | Status: AC

## 2024-08-19 NOTE — Progress Notes (Unsigned)
 Still having breast pain, variable severity.  Sx started about 2 weeks ago.  R breast sx.  No L sided sx.  No FCNAVD. Normal mammogram in 06/2024.   Meds, vitals, and allergies reviewed.   ROS: Per HPI unless specifically indicated in ROS section   Nad Ncat Neck supple, no LA Rrr Ctab Chaperoned exam.  Approximately 2 cm area of erythema that is tender to touch on the inferior portion of the right breast, at the junction with the chest wall.  No nipple changes.  No fluctuant mass.  Looks like she has a small area of cellulitis.  It does not look typical for a superficial fungal infection.

## 2024-08-19 NOTE — Patient Instructions (Signed)
 Start doxycycline  with food in the meantime.   Please update us  in 2 days, sooner if needed.  Take care.  Glad to see you.

## 2024-08-20 DIAGNOSIS — N61 Mastitis without abscess: Secondary | ICD-10-CM | POA: Insufficient documentation

## 2024-08-20 NOTE — Assessment & Plan Note (Signed)
 This does not appear typical for a malignancy.  Discussed options. Start doxycycline  with food in the meantime.   Please update us  in 2 days, sooner if needed.  Routine cautions given to patient.  She agrees to plan.

## 2024-08-21 ENCOUNTER — Telehealth: Payer: Self-pay

## 2024-08-21 NOTE — Telephone Encounter (Unsigned)
 Copied from CRM [id number]. Topic: General - Other >> Aug 21, 2024  1:55 PM Rosina BIRCH wrote: Reason for CRM: patient called stating the mass the doctor checked on her right breast on tuesday is much better. Patient stated there is a small amount of leakage/ drainage this morning, but it is still red

## 2024-08-21 NOTE — Telephone Encounter (Signed)
 I am glad to hear that this is better.  I did not expect it to completely resolve after 2 days but as long as she is improving then I would continue as is.  If it does not completely resolve by the time she finishes the antibiotics then please let us  know.  Thanks.  Routed to PCP as FYI.

## 2024-08-22 NOTE — Telephone Encounter (Signed)
 Reassuring  Thanks for seeing her

## 2024-09-25 ENCOUNTER — Emergency Department

## 2024-09-25 ENCOUNTER — Other Ambulatory Visit: Payer: Self-pay

## 2024-09-25 ENCOUNTER — Emergency Department
Admission: EM | Admit: 2024-09-25 | Discharge: 2024-09-25 | Disposition: A | Discharge status: Home / Self Care | Attending: Emergency Medicine | Admitting: Emergency Medicine

## 2024-09-25 DIAGNOSIS — S8001XA Contusion of right knee, initial encounter: Secondary | ICD-10-CM | POA: Insufficient documentation

## 2024-09-25 DIAGNOSIS — Y9301 Activity, walking, marching and hiking: Secondary | ICD-10-CM | POA: Insufficient documentation

## 2024-09-25 DIAGNOSIS — I10 Essential (primary) hypertension: Secondary | ICD-10-CM | POA: Diagnosis not present

## 2024-09-25 DIAGNOSIS — W0110XA Fall on same level from slipping, tripping and stumbling with subsequent striking against unspecified object, initial encounter: Secondary | ICD-10-CM | POA: Diagnosis not present

## 2024-09-25 DIAGNOSIS — Y92481 Parking lot as the place of occurrence of the external cause: Secondary | ICD-10-CM | POA: Insufficient documentation

## 2024-09-25 DIAGNOSIS — M25551 Pain in right hip: Secondary | ICD-10-CM | POA: Insufficient documentation

## 2024-09-25 DIAGNOSIS — M25561 Pain in right knee: Secondary | ICD-10-CM

## 2024-09-25 DIAGNOSIS — W19XXXA Unspecified fall, initial encounter: Secondary | ICD-10-CM

## 2024-09-25 MED ORDER — OXYCODONE HCL 5 MG PO TABS
5.0000 mg | ORAL_TABLET | Freq: Once | ORAL | Status: AC
Start: 2024-09-25 — End: 2024-09-25
  Administered 2024-09-25: 5 mg via ORAL
  Filled 2024-09-25: qty 1

## 2024-09-25 MED ORDER — OXYCODONE HCL 5 MG PO TABS: 5.0000 mg | ORAL_TABLET | Freq: Three times a day (TID) | ORAL | 0 refills | Status: AC | PRN

## 2024-09-25 NOTE — ED Triage Notes (Signed)
 After triage, patient reported pain to right hip as well.

## 2024-09-25 NOTE — ED Provider Notes (Signed)
 [facility] Emergency Department Provider Note     Event Date/Time   First MD Initiated Contact with Patient 09/25/24 1451     (approximate)   History   Fall   HPI  Natasha [name] is a 78 y.o. female with a past medical history of HTN, HLD, osteopenia presents to the ED following a fall.  Patient reports she was walking through a parking lot and tripped over the curb falling directly onto her right knee.  Noted swelling to right knee.  Also complains of right hip pain.  Pain is worse with ambulation.  Patient denies head injury or LOC.     Physical Exam   Triage Vital Signs: ED Triage Vitals  Encounter Vitals Group     BP 09/25/24 1334 (!) 168/78     Girls Systolic BP Percentile --      Girls Diastolic BP Percentile --      Boys Systolic BP Percentile --      Boys Diastolic BP Percentile --      Pulse Rate 09/25/24 1334 70     Resp 09/25/24 1334 16     Temp 09/25/24 1334 98.5 F (36.9 C)     Temp Source 09/25/24 1334 Oral     SpO2 09/25/24 1334 100 %     Weight 09/25/24 1335 200 lb (90.7 kg)     Height 09/25/24 1335 5' 4 (1.626 m)     Head Circumference --      Peak Flow --      Pain Score 09/25/24 1336 8     Pain Loc --      Pain Education --      Exclude from Growth Chart --     Most recent vital signs: Vitals:   09/25/24 1334  BP: (!) 168/78  Pulse: 70  Resp: 16  Temp: 98.5 F (36.9 C)  SpO2: 100%    General Awake, no distress.  HEENT NCAT.  CV:  Good peripheral perfusion. RESP:  Normal effort.  ABD:  No distention.  Other:  Right knee reveals ecchymosis and hematoma to the inferior lateral aspect.  Limited flexion secondary to swelling and pain.   Right hip reveals no deformity.  There is no ecchymosis or swelling.  Mild tenderness to palpation over anterior inferior iliac spine.    Pedal pulses heard on Doppler.  Patient is able to bear weight.  She does have a mildly antalgic gait favoring the right lower  extremity.   ED Results / Procedures / Treatments   Labs (all labs ordered are listed, but only abnormal results are displayed) Labs Reviewed - No data to display  RADIOLOGY  I personally viewed and evaluated these images as part of my medical decision making, as well as reviewing the written report by the radiologist.  ED Provider Interpretation: No acute bony abnormality noted in right knee or right hip  DG Hip Unilat W or Wo Pelvis 2-3 Views Right Result Date: 09/25/2024 EXAM: 2 or 3 VIEW(S) XRAY OF THE RIGHT HIP 09/25/2024 02:24:00 PM COMPARISON: None available. CLINICAL HISTORY: fall. Patient presents with right hip pain after tripping over a curb today. Patient is able to bear some weight to right leg. 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 No acute fracture or focal osseous lesion. SOFT TISSUES: The soft tissues are unremarkable. IMPRESSION: 1. No acute fracture or dislocation. 2. 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 Electronically signed by: Lynwood Seip MD 09/25/2024 02:46 PM EDT RP Workstation: [id number]   DG Knee Complete 4 Views Right Result Date: 09/25/2024 CLINICAL DATA:  Right knee pain following a fall today. EXAM: RIGHT KNEE - COMPLETE 4+ VIEW COMPARISON:  None Available. FINDINGS: Mild medial and lateral compartment degenerative spur formation. Medial and lateral meniscal calcifications. No fracture, dislocation or effusion. IMPRESSION: 1. No fracture. 2. Mild degenerative changes. 3. Chondrocalcinosis. Electronically Signed   By: Elspeth Bathe M.D.   On: 09/25/2024 14:45    PROCEDURES:  Critical Care performed: No  Procedures   MEDICATIONS ORDERED IN ED: Medications  oxyCODONE  (Oxy IR/ROXICODONE ) immediate release tablet 5 mg (5 mg Oral Given 09/25/24 1518)     IMPRESSION / MDM / ASSESSMENT AND PLAN / ED COURSE  I reviewed the triage vital signs  and the nursing notes.                               78 y.o. female presents to the emergency department for evaluation and treatment of fall. See HPI for further details.   Differential diagnosis includes, but is not limited to fracture, dislocation, contusion, hematoma   Patient's presentation is most consistent with acute complicated illness / injury requiring diagnostic workup.  Patient is alert and oriented.  She is hemodynamically stable.  Physical exam findings as stated above.  X-rays are reassuring.  Patient provided with walker and education on RICE therapy given.  ED return precaution discussed.  She is in stable condition for discharge home. Referral to orthopedics provided if symptoms persist.     FINAL CLINICAL IMPRESSION(S) / ED DIAGNOSES   Final diagnoses:  Fall, initial encounter  Right hip pain  Acute pain of right knee   Rx / DC Orders   ED Discharge Orders          Ordered    oxyCODONE  (ROXICODONE ) 5 MG immediate release tablet  Every 8 hours PRN        09/25/24 1705             Note:  This document was prepared using Dragon voice recognition software and may include unintentional dictation errors.    Margrette, Sharaya Boruff A, PA-C 09/25/24 LOVELL Waymond Lorelle Jerri, MD 09/26/24 1224

## 2024-09-25 NOTE — ED Triage Notes (Signed)
 Patient states she was walking through parking lot and tripped over the curb; complaining of pain to right knee.

## 2024-09-25 NOTE — Discharge Instructions (Signed)
 Your evaluated in the ED for right knee and right hip pain.  Your x-rays are normal and shows no fracture or dislocation.  Your hardware of your right hip remains intact.  Please use walker for assistance with walking.  Elevate the affected knee is much as possible above heart level.  Apply ice to the affected area to help reduce swelling.  Limit your physical activity.  If symptoms worsen please follow-up with orthopedic for further evaluation.

## 2024-10-02 ENCOUNTER — Ambulatory Visit
Admission: RE | Admit: 2024-10-02 | Discharge: 2024-10-02 | Disposition: A | Discharge status: Home / Self Care | Source: Ambulatory Visit | Attending: Student | Admitting: Student

## 2024-10-02 ENCOUNTER — Other Ambulatory Visit: Payer: Self-pay | Admitting: Student

## 2024-10-02 DIAGNOSIS — M25561 Pain in right knee: Secondary | ICD-10-CM

## 2024-10-02 DIAGNOSIS — M11261 Other chondrocalcinosis, right knee: Secondary | ICD-10-CM | POA: Insufficient documentation

## 2024-10-02 DIAGNOSIS — W19XXXA Unspecified fall, initial encounter: Secondary | ICD-10-CM

## 2024-10-02 DIAGNOSIS — M1711 Unilateral primary osteoarthritis, right knee: Secondary | ICD-10-CM | POA: Insufficient documentation

## 2025-03-25 ENCOUNTER — Ambulatory Visit
